# Patient Record
Sex: Female | Born: 2000 | Race: White | Hispanic: No | Marital: Married
Health system: Southern US, Academic
[De-identification: ages and names within clinical notes are randomized; demographics above are authoritative.]

## PROBLEM LIST (undated history)

## (undated) ENCOUNTER — Ambulatory Visit

## (undated) ENCOUNTER — Ambulatory Visit: Attending: Clinical | Primary: Clinical

## (undated) ENCOUNTER — Encounter

## (undated) ENCOUNTER — Ambulatory Visit: Payer: BLUE CROSS/BLUE SHIELD | Attending: "Endocrinology | Primary: "Endocrinology

## (undated) ENCOUNTER — Telehealth

## (undated) ENCOUNTER — Ambulatory Visit: Payer: BLUE CROSS/BLUE SHIELD

## (undated) ENCOUNTER — Encounter: Attending: Physician Assistant | Primary: Physician Assistant

## (undated) ENCOUNTER — Encounter: Attending: Clinical | Primary: Clinical

## (undated) ENCOUNTER — Ambulatory Visit: Attending: Ambulatory Care | Primary: Ambulatory Care

## (undated) ENCOUNTER — Encounter: Attending: Family | Primary: Family

## (undated) ENCOUNTER — Encounter
Attending: Student in an Organized Health Care Education/Training Program | Primary: Student in an Organized Health Care Education/Training Program

## (undated) ENCOUNTER — Encounter: Payer: BLUE CROSS/BLUE SHIELD | Attending: Family | Primary: Family

## (undated) ENCOUNTER — Ambulatory Visit: Payer: PRIVATE HEALTH INSURANCE | Attending: Clinical | Primary: Clinical

## (undated) ENCOUNTER — Encounter: Attending: "Endocrinology | Primary: "Endocrinology

## (undated) ENCOUNTER — Encounter: Attending: Nutritionist | Primary: Nutritionist

## (undated) ENCOUNTER — Telehealth
Attending: Student in an Organized Health Care Education/Training Program | Primary: Student in an Organized Health Care Education/Training Program

## (undated) ENCOUNTER — Ambulatory Visit: Payer: BLUE CROSS/BLUE SHIELD | Attending: Nutritionist | Primary: Nutritionist

## (undated) ENCOUNTER — Other Ambulatory Visit

## (undated) ENCOUNTER — Ambulatory Visit
Payer: BLUE CROSS/BLUE SHIELD | Attending: Student in an Organized Health Care Education/Training Program | Primary: Student in an Organized Health Care Education/Training Program

## (undated) ENCOUNTER — Telehealth: Attending: Family | Primary: Family

## (undated) ENCOUNTER — Encounter: Attending: Internal Medicine | Primary: Internal Medicine

## (undated) ENCOUNTER — Ambulatory Visit: Payer: BLUE CROSS/BLUE SHIELD | Attending: Internal Medicine | Primary: Internal Medicine

## (undated) ENCOUNTER — Ambulatory Visit: Payer: PRIVATE HEALTH INSURANCE

## (undated) ENCOUNTER — Ambulatory Visit: Attending: Pharmacist | Primary: Pharmacist

## (undated) ENCOUNTER — Telehealth: Attending: Nutritionist | Primary: Nutritionist

## (undated) ENCOUNTER — Telehealth: Attending: Ambulatory Care | Primary: Ambulatory Care

## (undated) ENCOUNTER — Encounter: Payer: BLUE CROSS/BLUE SHIELD | Attending: "Endocrinology | Primary: "Endocrinology

## (undated) ENCOUNTER — Non-Acute Institutional Stay: Payer: BLUE CROSS/BLUE SHIELD | Attending: Nutritionist | Primary: Nutritionist

## (undated) ENCOUNTER — Inpatient Hospital Stay

## (undated) ENCOUNTER — Encounter: Payer: BLUE CROSS/BLUE SHIELD | Attending: Internal Medicine | Primary: Internal Medicine

## (undated) ENCOUNTER — Ambulatory Visit: Payer: Medicaid (Managed Care)

## (undated) ENCOUNTER — Telehealth: Attending: "Endocrinology | Primary: "Endocrinology

## (undated) DIAGNOSIS — M1990 Unspecified osteoarthritis, unspecified site: Secondary | ICD-10-CM

## (undated) DIAGNOSIS — E119 Type 2 diabetes mellitus without complications: Secondary | ICD-10-CM

## (undated) HISTORY — PX: TONSILLECTOMY: SUR1361

## (undated) MED ORDER — FAMOTIDINE 10 MG TABLET: Freq: Two times a day (BID) | ORAL | 0 days

## (undated) MED ORDER — CETIRIZINE 10 MG TABLET: Freq: Two times a day (BID) | ORAL | 0 days | PRN

## (undated) MED ORDER — IBUPROFEN 200 MG TABLET: Freq: Four times a day (QID) | ORAL | 0.00 days | PRN

## (undated) MED ORDER — INSULIN ASPART (U-100) 100 UNIT/ML (3 ML) SUBCUTANEOUS PEN: Freq: Three times a day (TID) | SUBCUTANEOUS | 0 days | Status: SS

## (undated) MED ORDER — MULTIVITAMIN CAPSULE: Freq: Every day | ORAL | 0 days

## (undated) MED ORDER — DIPHENHYDRAMINE 25 MG TABLET: Freq: Every evening | ORAL | 0.00 days | PRN

## (undated) MED ORDER — MAGNESIUM 30 MG TABLET: Freq: Two times a day (BID) | ORAL | 0 days

---
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
## 2019-02-18 ENCOUNTER — Encounter
Admit: 2019-02-18 | Discharge: 2019-02-19 | Payer: BLUE CROSS/BLUE SHIELD | Attending: "Endocrinology | Primary: "Endocrinology

## 2019-02-18 DIAGNOSIS — E1065 Type 1 diabetes mellitus with hyperglycemia: Principal | ICD-10-CM

## 2019-02-18 MED ORDER — PEN NEEDLE, DIABETIC 32 GAUGE X 5/32" (4 MM)
12 refills | 0 days | Status: CP
Start: 2019-02-18 — End: ?

## 2019-02-18 MED ORDER — INSULIN ASPART (U-100) 100 UNIT/ML (3 ML) SUBCUTANEOUS PEN: mL | 12 refills | 0 days | Status: AC

## 2019-02-18 MED ORDER — INSULIN ASPART (U-100) 100 UNIT/ML (3 ML) SUBCUTANEOUS PEN
INJECTION | 12 refills | 0.00000 days | Status: CP
Start: 2019-02-18 — End: 2019-02-18

## 2019-02-18 MED ORDER — LANTUS SOLOSTAR U-100 INSULIN 100 UNIT/ML (3 ML) SUBCUTANEOUS PEN
12 refills | 0 days | Status: CP
Start: 2019-02-18 — End: ?

## 2019-08-22 ENCOUNTER — Ambulatory Visit
Admit: 2019-08-22 | Discharge: 2019-08-24 | Disposition: A | Discharge status: Home / Self Care | Payer: BLUE CROSS/BLUE SHIELD | Admitting: Pulmonary Disease

## 2019-10-12 ENCOUNTER — Ambulatory Visit: Admit: 2019-10-12 | Discharge: 2019-10-14 | Payer: BLUE CROSS/BLUE SHIELD

## 2019-10-12 ENCOUNTER — Encounter: Admit: 2019-10-12 | Discharge: 2019-10-14 | Payer: BLUE CROSS/BLUE SHIELD

## 2019-10-14 DIAGNOSIS — A749 Chlamydial infection, unspecified: Principal | ICD-10-CM

## 2019-10-14 MED ORDER — DOXYCYCLINE HYCLATE 100 MG CAPSULE
ORAL_CAPSULE | Freq: Two times a day (BID) | ORAL | 0 refills | 7.00000 days | Status: CP
Start: 2019-10-14 — End: 2019-10-21

## 2019-10-25 ENCOUNTER — Encounter
Admit: 2019-10-25 | Discharge: 2019-10-26 | Payer: BLUE CROSS/BLUE SHIELD | Attending: Internal Medicine | Primary: Internal Medicine

## 2019-10-25 DIAGNOSIS — E1065 Type 1 diabetes mellitus with hyperglycemia: Principal | ICD-10-CM

## 2019-10-25 MED ORDER — DEXCOM G6 SENSOR DEVICE
2 refills | 0 days | Status: CP
Start: 2019-10-25 — End: ?

## 2019-10-25 MED ORDER — DEXCOM G6 TRANSMITTER DEVICE
3 refills | 0.00000 days | Status: CP
Start: 2019-10-25 — End: ?

## 2019-11-08 ENCOUNTER — Encounter
Admit: 2019-11-08 | Discharge: 2019-11-09 | Payer: BLUE CROSS/BLUE SHIELD | Attending: Nutritionist | Primary: Nutritionist

## 2019-11-28 ENCOUNTER — Ambulatory Visit: Admit: 2019-11-28 | Discharge: 2019-11-29 | Payer: BLUE CROSS/BLUE SHIELD

## 2019-11-28 DIAGNOSIS — H5213 Myopia, bilateral: Principal | ICD-10-CM

## 2019-11-28 DIAGNOSIS — E1065 Type 1 diabetes mellitus with hyperglycemia: Principal | ICD-10-CM

## 2019-11-28 DIAGNOSIS — H50812 Duane's syndrome, left eye: Principal | ICD-10-CM

## 2019-12-06 ENCOUNTER — Encounter
Admit: 2019-12-06 | Discharge: 2019-12-07 | Payer: BLUE CROSS/BLUE SHIELD | Attending: Internal Medicine | Primary: Internal Medicine

## 2019-12-06 DIAGNOSIS — E1065 Type 1 diabetes mellitus with hyperglycemia: Principal | ICD-10-CM

## 2019-12-14 DIAGNOSIS — E1069 Type 1 diabetes mellitus with other specified complication: Principal | ICD-10-CM

## 2019-12-15 MED ORDER — INSULIN ASPART U-100  100 UNIT/ML SUBCUTANEOUS SOLUTION
4 refills | 0 days | Status: CP
Start: 2019-12-15 — End: 2020-12-14

## 2019-12-19 ENCOUNTER — Encounter
Admit: 2019-12-19 | Discharge: 2019-12-20 | Payer: BLUE CROSS/BLUE SHIELD | Attending: Nutritionist | Primary: Nutritionist

## 2019-12-19 DIAGNOSIS — E1065 Type 1 diabetes mellitus with hyperglycemia: Principal | ICD-10-CM

## 2020-01-17 ENCOUNTER — Institutional Professional Consult (permissible substitution)
Admit: 2020-01-17 | Discharge: 2020-01-18 | Payer: BLUE CROSS/BLUE SHIELD | Attending: Nutritionist | Primary: Nutritionist

## 2020-01-17 DIAGNOSIS — E1065 Type 1 diabetes mellitus with hyperglycemia: Principal | ICD-10-CM

## 2020-03-09 ENCOUNTER — Encounter
Admit: 2020-03-09 | Discharge: 2020-03-10 | Payer: BLUE CROSS/BLUE SHIELD | Attending: Internal Medicine | Primary: Internal Medicine

## 2020-03-09 DIAGNOSIS — E1069 Type 1 diabetes mellitus with other specified complication: Principal | ICD-10-CM

## 2020-03-09 MED ORDER — GLUCAGON 3 MG/ACTUATION NASAL SPRAY
Freq: Once | NASAL | 3 refills | 0 days | Status: CP | PRN
Start: 2020-03-09 — End: ?

## 2020-03-09 MED ORDER — INSULIN ASPART U-100  100 UNIT/ML SUBCUTANEOUS SOLUTION
4 refills | 0.00000 days | Status: CP
Start: 2020-03-09 — End: 2021-03-09

## 2020-03-30 DIAGNOSIS — E1065 Type 1 diabetes mellitus with hyperglycemia: Principal | ICD-10-CM

## 2020-03-30 MED ORDER — PEN NEEDLE, DIABETIC 32 GAUGE X 5/32" (4 MM)
12 refills | 0 days | Status: CP
Start: 2020-03-30 — End: ?

## 2020-04-12 DIAGNOSIS — M0800 Unspecified juvenile rheumatoid arthritis of unspecified site: Principal | ICD-10-CM

## 2020-04-23 ENCOUNTER — Encounter: Admit: 2020-04-23 | Discharge: 2020-04-24 | Payer: BLUE CROSS/BLUE SHIELD

## 2020-04-23 ENCOUNTER — Ambulatory Visit: Admit: 2020-04-23 | Discharge: 2020-04-24 | Payer: BLUE CROSS/BLUE SHIELD

## 2020-04-23 DIAGNOSIS — M2550 Pain in unspecified joint: Principal | ICD-10-CM

## 2020-04-23 DIAGNOSIS — M47819 Spondylosis without myelopathy or radiculopathy, site unspecified: Principal | ICD-10-CM

## 2020-04-23 MED ORDER — PANTOPRAZOLE 20 MG TABLET,DELAYED RELEASE
ORAL_TABLET | Freq: Every day | ORAL | 3 refills | 90.00000 days | Status: CP
Start: 2020-04-23 — End: 2021-04-23

## 2020-04-24 ENCOUNTER — Institutional Professional Consult (permissible substitution): Admit: 2020-04-24 | Discharge: 2020-04-25

## 2020-04-24 DIAGNOSIS — Z006 Encounter for examination for normal comparison and control in clinical research program: Principal | ICD-10-CM

## 2020-04-25 ENCOUNTER — Encounter
Admit: 2020-04-25 | Discharge: 2020-04-26 | Payer: BLUE CROSS/BLUE SHIELD | Attending: Nutritionist | Primary: Nutritionist

## 2020-04-25 DIAGNOSIS — E1069 Type 1 diabetes mellitus with other specified complication: Principal | ICD-10-CM

## 2020-04-25 DIAGNOSIS — E1065 Type 1 diabetes mellitus with hyperglycemia: Principal | ICD-10-CM

## 2020-04-25 MED ORDER — INSULIN ASPART U-100  100 UNIT/ML SUBCUTANEOUS SOLUTION
4 refills | 0.00000 days | Status: CP
Start: 2020-04-25 — End: 2021-04-25

## 2020-04-25 MED ORDER — DEXCOM G6 SENSOR DEVICE
2 refills | 0 days | Status: CP
Start: 2020-04-25 — End: ?

## 2020-04-25 MED ORDER — INPEN (FOR NOVOLOG OR FIASP) SUBCUTANEOUS
SUBCUTANEOUS | 0 refills | 0.00000 days | Status: CP
Start: 2020-04-25 — End: ?

## 2020-04-25 MED ORDER — NORETHINDRONE ACETATE 1 MG-ETHINYL ESTRADIOL 20 MCG TABLET
ORAL_TABLET | Freq: Every day | ORAL | 0 refills | 84.00000 days | Status: CP
Start: 2020-04-25 — End: ?

## 2020-04-25 MED ORDER — DEXCOM G6 TRANSMITTER DEVICE
3 refills | 0.00000 days | Status: CP
Start: 2020-04-25 — End: ?

## 2020-05-01 ENCOUNTER — Institutional Professional Consult (permissible substitution): Admit: 2020-05-01 | Discharge: 2020-05-02

## 2020-05-01 DIAGNOSIS — Z006 Encounter for examination for normal comparison and control in clinical research program: Principal | ICD-10-CM

## 2020-05-01 MED ORDER — STUDY TTP399-1189 TTP399/PLACEBO 400 MG TABLET
PACK | Freq: Every morning | ORAL | 0 refills | 20.00000 days | Status: CP
Start: 2020-05-01 — End: 2020-05-08

## 2020-05-01 MED ORDER — NAPROXEN 500 MG TABLET
ORAL_TABLET | Freq: Two times a day (BID) | ORAL | 1 refills | 30.00 days | Status: CP
Start: 2020-05-01 — End: 2020-06-30

## 2020-05-08 ENCOUNTER — Ambulatory Visit: Admit: 2020-05-08 | Discharge: 2020-05-09

## 2020-05-14 ENCOUNTER — Institutional Professional Consult (permissible substitution): Admit: 2020-05-14 | Discharge: 2020-05-15

## 2020-05-14 DIAGNOSIS — Z006 Encounter for examination for normal comparison and control in clinical research program: Principal | ICD-10-CM

## 2020-05-16 ENCOUNTER — Ambulatory Visit: Admit: 2020-05-16 | Discharge: 2020-05-17 | Payer: BLUE CROSS/BLUE SHIELD

## 2020-05-16 ENCOUNTER — Ambulatory Visit
Admit: 2020-05-16 | Discharge: 2020-05-20 | Disposition: A | Discharge status: Home / Self Care | Payer: BLUE CROSS/BLUE SHIELD

## 2020-05-16 DIAGNOSIS — M549 Dorsalgia, unspecified: Principal | ICD-10-CM

## 2020-05-20 MED ORDER — ACETAMINOPHEN 500 MG TABLET
ORAL_TABLET | Freq: Three times a day (TID) | ORAL | 0 refills | 5.00000 days | PRN
Start: 2020-05-20 — End: ?

## 2020-05-20 MED ORDER — ONDANSETRON 4 MG DISINTEGRATING TABLET
ORAL_TABLET | Freq: Three times a day (TID) | ORAL | 0 refills | 4 days | Status: CP | PRN
Start: 2020-05-20 — End: ?

## 2020-05-20 MED ORDER — IBUPROFEN 600 MG TABLET
ORAL_TABLET | Freq: Four times a day (QID) | ORAL | 2 refills | 15 days | PRN
Start: 2020-05-20 — End: 2021-05-20

## 2020-05-21 MED ORDER — CIPROFLOXACIN 500 MG TABLET
ORAL_TABLET | Freq: Two times a day (BID) | ORAL | 0 refills | 6 days | Status: CP
Start: 2020-05-21 — End: 2020-05-27

## 2020-05-31 MED ORDER — NAPROXEN 500 MG TABLET
0.00000 days
Start: 2020-05-31 — End: 2020-06-19

## 2020-06-19 ENCOUNTER — Ambulatory Visit: Admit: 2020-06-19 | Discharge: 2020-06-20 | Payer: BLUE CROSS/BLUE SHIELD

## 2020-06-19 DIAGNOSIS — E1069 Type 1 diabetes mellitus with other specified complication: Principal | ICD-10-CM

## 2020-06-19 MED ORDER — NORETHINDRONE ACETATE 1 MG-ETHINYL ESTRADIOL 20 MCG TABLET
ORAL_TABLET | Freq: Every day | ORAL | 2 refills | 84 days | Status: CP
Start: 2020-06-19 — End: ?

## 2020-07-17 ENCOUNTER — Institutional Professional Consult (permissible substitution)
Admit: 2020-07-17 | Discharge: 2020-07-18 | Payer: BLUE CROSS/BLUE SHIELD | Attending: Nutritionist | Primary: Nutritionist

## 2020-09-11 DIAGNOSIS — L509 Urticaria, unspecified: Principal | ICD-10-CM

## 2020-09-11 DIAGNOSIS — M19071 Primary osteoarthritis, right ankle and foot: Principal | ICD-10-CM

## 2020-09-11 DIAGNOSIS — M19072 Primary osteoarthritis, left ankle and foot: Principal | ICD-10-CM

## 2020-09-11 DIAGNOSIS — Z79899 Other long term (current) drug therapy: Principal | ICD-10-CM

## 2020-09-11 DIAGNOSIS — Z880 Allergy status to penicillin: Principal | ICD-10-CM

## 2020-09-11 DIAGNOSIS — Z794 Long term (current) use of insulin: Principal | ICD-10-CM

## 2020-09-11 DIAGNOSIS — E109 Type 1 diabetes mellitus without complications: Principal | ICD-10-CM

## 2020-09-11 DIAGNOSIS — Z9641 Presence of insulin pump (external) (internal): Principal | ICD-10-CM

## 2020-09-12 ENCOUNTER — Encounter
Admit: 2020-09-12 | Discharge: 2020-09-12 | Disposition: A | Discharge status: Home / Self Care | Payer: BLUE CROSS/BLUE SHIELD

## 2020-09-12 MED ORDER — TRIAMCINOLONE ACETONIDE 0.1 % TOPICAL CREAM
Freq: Two times a day (BID) | TOPICAL | 0 refills | 0 days | Status: CP
Start: 2020-09-12 — End: 2021-09-12

## 2020-09-12 MED ORDER — MONTELUKAST 10 MG TABLET
ORAL_TABLET | Freq: Every evening | ORAL | 0 refills | 20 days | Status: CP
Start: 2020-09-12 — End: 2020-10-02

## 2020-09-19 ENCOUNTER — Encounter
Admit: 2020-09-19 | Discharge: 2020-09-20 | Payer: BLUE CROSS/BLUE SHIELD | Attending: Internal Medicine | Primary: Internal Medicine

## 2020-09-19 ENCOUNTER — Encounter
Admit: 2020-09-19 | Discharge: 2020-09-20 | Payer: BLUE CROSS/BLUE SHIELD | Attending: Physician Assistant | Primary: Physician Assistant

## 2020-09-19 DIAGNOSIS — E1065 Type 1 diabetes mellitus with hyperglycemia: Principal | ICD-10-CM

## 2020-09-19 DIAGNOSIS — R319 Hematuria, unspecified: Principal | ICD-10-CM

## 2020-09-19 DIAGNOSIS — E063 Autoimmune thyroiditis: Principal | ICD-10-CM

## 2020-09-19 DIAGNOSIS — N898 Other specified noninflammatory disorders of vagina: Principal | ICD-10-CM

## 2020-09-19 MED ORDER — LEVOTHYROXINE 25 MCG TABLET
ORAL_TABLET | Freq: Every day | ORAL | 3 refills | 90 days | Status: CP
Start: 2020-09-19 — End: 2021-09-19

## 2020-09-19 MED ORDER — FLUCONAZOLE 150 MG TABLET
ORAL_TABLET | 0 refills | 0 days | Status: CP
Start: 2020-09-19 — End: ?

## 2020-09-20 MED ORDER — CLINDAMYCIN 2 % VAGINAL CREAM
Freq: Every evening | VAGINAL | 0 refills | 8 days | Status: CP
Start: 2020-09-20 — End: 2020-09-27

## 2020-12-07 MED ORDER — XOLAIR 150 MG/ML SUBCUTANEOUS SYRINGE
0.00000 days
Start: 2020-12-07 — End: ?

## 2020-12-19 MED ORDER — PREDNISONE 20 MG TABLET
0.00000 days
Start: 2020-12-19 — End: ?

## 2020-12-25 ENCOUNTER — Encounter
Admit: 2020-12-25 | Discharge: 2020-12-26 | Payer: BLUE CROSS/BLUE SHIELD | Attending: Internal Medicine | Primary: Internal Medicine

## 2020-12-25 DIAGNOSIS — E1069 Type 1 diabetes mellitus with other specified complication: Principal | ICD-10-CM

## 2020-12-25 MED ORDER — DEXCOM G6 SENSOR DEVICE
2 refills | 0 days | Status: CP
Start: 2020-12-25 — End: ?

## 2020-12-25 MED ORDER — DEXCOM G6 TRANSMITTER DEVICE
3 refills | 0.00000 days | Status: CP
Start: 2020-12-25 — End: ?

## 2021-01-02 ENCOUNTER — Encounter
Admit: 2021-01-02 | Discharge: 2021-01-03 | Payer: BLUE CROSS/BLUE SHIELD | Attending: Internal Medicine | Primary: Internal Medicine

## 2021-02-05 ENCOUNTER — Encounter: Admit: 2021-02-05 | Discharge: 2021-02-06 | Payer: BLUE CROSS/BLUE SHIELD

## 2021-02-05 DIAGNOSIS — E039 Hypothyroidism, unspecified: Principal | ICD-10-CM

## 2021-02-05 DIAGNOSIS — Z113 Encounter for screening for infections with a predominantly sexual mode of transmission: Principal | ICD-10-CM

## 2021-02-05 DIAGNOSIS — Z13220 Encounter for screening for lipoid disorders: Principal | ICD-10-CM

## 2021-02-05 DIAGNOSIS — Z1159 Encounter for screening for other viral diseases: Principal | ICD-10-CM

## 2021-02-05 DIAGNOSIS — E1065 Type 1 diabetes mellitus with hyperglycemia: Principal | ICD-10-CM

## 2021-02-05 DIAGNOSIS — T7840XS Allergy, unspecified, sequela: Principal | ICD-10-CM

## 2021-02-05 DIAGNOSIS — Z0000 Encounter for general adult medical examination without abnormal findings: Principal | ICD-10-CM

## 2021-02-08 DIAGNOSIS — E039 Hypothyroidism, unspecified: Principal | ICD-10-CM

## 2021-02-21 ENCOUNTER — Ambulatory Visit: Admit: 2021-02-21 | Payer: BLUE CROSS/BLUE SHIELD

## 2021-03-17 MED ORDER — PREDNISONE 50 MG TABLET
Freq: Every day | ORAL | 0 days
Start: 2021-03-17 — End: ?

## 2021-03-20 ENCOUNTER — Ambulatory Visit: Admit: 2021-03-20 | Discharge: 2021-03-21 | Attending: Ambulatory Care | Primary: Ambulatory Care

## 2021-04-03 ENCOUNTER — Ambulatory Visit: Admit: 2021-04-03 | Discharge: 2021-04-03 | Payer: PRIVATE HEALTH INSURANCE

## 2021-04-03 ENCOUNTER — Encounter
Admit: 2021-04-03 | Discharge: 2021-04-03 | Payer: PRIVATE HEALTH INSURANCE | Attending: Internal Medicine | Primary: Internal Medicine

## 2021-04-03 DIAGNOSIS — R635 Abnormal weight gain: Principal | ICD-10-CM

## 2021-04-03 DIAGNOSIS — Z124 Encounter for screening for malignant neoplasm of cervix: Principal | ICD-10-CM

## 2021-04-03 DIAGNOSIS — E063 Autoimmune thyroiditis: Principal | ICD-10-CM

## 2021-04-03 DIAGNOSIS — E1069 Type 1 diabetes mellitus with other specified complication: Principal | ICD-10-CM

## 2021-04-03 MED ORDER — INSULIN LISPRO (U-100) 100 UNIT/ML SUBCUTANEOUS PEN
12 refills | 0.00000 days | Status: CP
Start: 2021-04-03 — End: ?

## 2021-04-03 MED ORDER — BLOOD SUGAR DIAGNOSTIC STRIPS
Freq: Four times a day (QID) | 12 refills | 0 days | Status: CP
Start: 2021-04-03 — End: ?

## 2021-04-03 MED ORDER — LANCETS 33 GAUGE
3 refills | 0 days | Status: CP
Start: 2021-04-03 — End: ?
  Filled 2021-04-15: qty 200, 33d supply, fill #0

## 2021-04-03 MED ORDER — INSULIN LISPRO (U-100) 100 UNIT/ML SUBCUTANEOUS SOLUTION
12 refills | 0.00000 days | Status: CP
Start: 2021-04-03 — End: ?
  Filled 2021-04-15: qty 90, 90d supply, fill #0

## 2021-04-15 MED FILL — CONTOUR NEXT TEST STRIPS: 75 days supply | Qty: 300 | Fill #0

## 2021-04-16 MED ORDER — HYDROXYZINE HCL 25 MG TABLET
ORAL_TABLET | 3 refills | 0 days
Start: 2021-04-16 — End: ?

## 2021-04-16 MED ORDER — LEVOCETIRIZINE 5 MG TABLET
ORAL_TABLET | 5 refills | 0 days
Start: 2021-04-16 — End: ?

## 2021-04-19 ENCOUNTER — Encounter: Admit: 2021-04-19 | Discharge: 2021-04-20 | Payer: BLUE CROSS/BLUE SHIELD

## 2021-04-19 MED FILL — HYDROXYZINE HCL 25 MG TABLET: 20 days supply | Qty: 60 | Fill #0

## 2021-04-30 MED ORDER — TACROLIMUS 1 MG CAPSULE, IMMEDIATE-RELEASE
ORAL_CAPSULE | Freq: Two times a day (BID) | ORAL | 1 refills | 30.00000 days
Start: 2021-04-30 — End: ?

## 2021-05-01 NOTE — Unmapped (Signed)
[name] Specialty Medication Onboarding    Specialty Medication: Tacrolimus 1mg capsule  Prior Authorization: Not Required   Financial Assistance: No - copay  <$25  Final Copay/Day Supply: $0 / 30 days    Insurance Restrictions: None     Notes to Pharmacist: N/A    The triage team has completed the benefits investigation and has determined that the patient is able to fill this medication at [facility]. Please contact the patient to complete the onboarding or follow up with the prescribing physician as needed.

## 2021-05-02 MED FILL — TACROLIMUS 1 MG CAPSULE, IMMEDIATE-RELEASE: ORAL | 30 days supply | Qty: 60 | Fill #0

## 2021-05-02 NOTE — Unmapped (Signed)
[name]   Patient Onboarding/Medication Counseling    Sandra Underwood is a 20 y.o. female with urticaria who I am counseling today on initiation of therapy.  I am speaking to the patient.    Was a Nurse, learning disability used for this call? No    Verified patient's date of birth / HIPAA.    Specialty medication(s) to be sent: Inflammatory Disorders: tacrolimus      Non-specialty medications/supplies to be sent: na      Medications not needed at this time: na         Prograf (tacrolimus)    Medication & Administration     Dosage: Take 1 capsule (1 mg) by mouth twice daily     Administration:   ??? May take with or without food  ??? Take 12 hours apart    Adherence/Missed dose instructions:  ??? Take a missed dose as soon as you think about it.  ??? If it is close to the time for your next dose, skip the missed dose and go back to your normal time.  ??? Do not take 2 doses at the same time or extra doses.    Goals of Therapy     ??? To reduce itching, and reduce need for corticosteroids    Side Effects & Monitoring Parameters     ??? Common side effects  ??? Dizziness  ??? Fatigue  ??? Headache  ??? Stuffy nose or sore throat  ??? Nausea, vomiting, stomach pain, diarrhea, constipation  ??? Heartburn  ??? Back or joint pain  ??? Increased risk of infection    ??? The following side effects should be reported to the provider:  ??? Allergic reaction  ??? Kidney issues (change in quantity or urine passed, blood in urine, or weight gain)  ??? High blood pressure (dizziness, change in eyesight, headache)  ??? Electrolyte issues (change in mood, confusion, muscle pain, or weakness)  ??? Abnormal breathing  ??? Shakiness  ??? Unexplained bleeding or bruising (gums bleeding, blood in urine, nosebleeds, any abnormal bleeding)  ??? Signs of infection (fever, cough, wounds that will not heal)  ??? Skin changes (sores, paleness, new or changed bumps or moles)    ??? Monitoring Parameters  ??? Renal function  ??? Liver function  ??? Glucose levels  ??? Blood pressure  ??? Tacrolimus trough levels  ??? Cardiac monitoring (for QT prolongation)       Contraindications, Warnings, & Precautions     ??? Black Box Warning: Infections - immunosuppressant agents increase the risk of infection that may lead to hospitalization or death  ??? Black Box Warning: Malignancy - immunosuppressant agents may be associated with the development of malignancies that may lead to hospitalization or death  ??? Limit or avoid sun and ultraviolet light exposure, use appropriate sun protection  ??? Myocardial hypertrophy -avoid use in patients with congenital long QT syndrome  ??? Diabetes mellitus - the risk for new-onset diabetes and insulin-dependent post-transplant diabetes mellitus is increased with tacrolimus use after transplantation  ??? GI perforation  ??? Hyperkalemia  ??? Hypertension  ??? Nephrotoxicity  ??? Neurotoxicity  ??? This is a narrow therapeutic index drug. Do not switch manufacturers without first talking to the provider.    Drug/Food Interactions     ??? Medication list reviewed in Epic. The patient was instructed to inform the care team before taking any new medications or supplements. reviewed risk of QT prolongation, hydroxyzine and tacrolimus are both QT prolongers. Reviewed not to add new medicines (  zofran, etc) without reviewing med list. Her hope is to be able to reduce need for hydrox.Marland Kitchen   ??? Avoid alcohol  ??? Avoid grapefruit or grapefruit juice  ??? Avoid live vaccines    Storage, Handling Precautions, & Disposal     ??? Store at room temperature  ??? Keep away from children and pets      Current Medications (including OTC/herbals), Comorbidities and Allergies     Current Outpatient Medications   Medication Sig Dispense Refill   ??? acetaminophen (TYLENOL) 500 MG tablet Take 2 tablets (1,000 mg total) by mouth every eight (8) hours as needed for pain. 30 tablet 0   ??? blood sugar diagnostic Strp Use to check blood sugar four (4) times a day (before meals and nightly). 300 each 12   ??? blood-glucose sensor (DEXCOM G6 SENSOR) Devi 1 each by Miscellaneous route every ten (10) days. 9 each 2   ??? blood-glucose transmitter (DEXCOM G6 TRANSMITTER) Devi 1 each by Miscellaneous route Every three (3) months. 1 each 3   ??? cetirizine (ZYRTEC) 10 MG tablet Take 20 mg by mouth two (2) times a day as needed for allergies.     ??? famotidine (PEPCID) 10 MG tablet Take 10 mg by mouth Two (2) times a day.     ??? hydrOXYzine (ATARAX) 25 MG tablet Take 1 tablet by mouth every eight hours as needed for itching 60 tablet 3   ??? insulin admin supplies (INPEN, FOR NOVOLOG OR FIASP,) InPn injection pen Dispense 1 InPen 1 each 0   ??? insulin lispro (HUMALOG KWIKPEN INSULIN) 100 unit/mL injection pen Use up to 30 units per day, as per MD instructions 15 mL 12   ??? insulin lispro (HUMALOG U-100 INSULIN) 100 unit/mL injection Use up to 100 units/day, divided into 3 times daily before meals as per MD instructions 90 mL 12   ??? lancets (ONETOUCH DELICA LANCETS) 33 gauge Misc Use to check bloof sugar 6 times daily 200 each 3   ??? levocetirizine (XYZAL) 5 MG tablet Take 2 tablets by mouth twice a day for urticaria 120 tablet 5   ??? levothyroxine (SYNTHROID) 25 MCG tablet Take 1 tablet (25 mcg total) by mouth daily. 90 tablet 3   ??? magnesium 30 mg tablet Take 30 mg by mouth Two (2) times a day.     ??? montelukast (SINGULAIR) 10 mg tablet Take 1 tablet (10 mg total) by mouth nightly for 20 days. 20 tablet 0   ??? norethindrone-ethinyl estradiol (JUNEL 1/20, 21,) 1-20 mg-mcg per tablet Take 1 tablet by mouth daily. 84 tablet 2   ??? pen needle, diabetic (BD ULTRA-FINE NANO PEN NEEDLE) 32 gauge x 5/32 (4 mm) Ndle ok to sub any brand or size needle preferred by insurance/patient, use 4x/day, dx E10.65 100 each 12   ??? pen needle, diabetic (BD ULTRA-FINE NANO PEN NEEDLE) 32 gauge x 5/32 Ndle To administer insulin 6-8x/day 200 each 3   ??? predniSONE (DELTASONE) 50 MG tablet Take 50 mg by mouth in the morning.     ??? syringe with needle 1 mL 28 gauge x 1/2 Syrg Use every 7 (seven) days. ??? tacrolimus (PROGRAF) 1 MG capsule Take 1 capsule (1 mg total) by mouth two (2) times a day. 60 capsule 1   ??? XOLAIR 150 mg/mL syringe        No current facility-administered medications for this visit.       Allergies   Allergen Reactions   ??? Amoxicillin-Pot Clavulanate Rash   ???  Grass Pollen Hives   ??? Lemon Hives   ??? Malt Extract Hives   ??? Metronidazole Hives   ??? Peanut Hives   ??? Tree And Shrub Pollen Hives   ??? Amoxicillin Rash   ??? Penicillins Rash       Patient Active Problem List   Diagnosis   ??? Enthesitis related arthritis (CMS-HCC)   ??? Type 1 diabetes mellitus with hyperglycemia, with long-term current use of insulin (CMS-HCC)   ??? Spondyloarthritis   ??? Polyarthralgia   ??? Hematuria   ??? Vaginal discharge   ??? Hypothyroidism   ??? Allergies       Reviewed and up to date in Epic.    Appropriateness of Therapy     Acute infections noted within Epic:  No active infections  Patient reported infection: None    Is medication and dose appropriate based on diagnosis and infection status? Yes    Prescription has been clinically reviewed: Yes      Baseline Quality of Life Assessment      How many days over the past month did your urticaria  keep you from your normal activities? For example, brushing your teeth or getting up in the morning. Patient declined to answer    Financial Information     Medication Assistance provided: None Required    Anticipated copay of $0 reviewed with patient. Verified delivery address.    Delivery Information     Scheduled delivery date: Thurs, 6/30    Expected start date: Thurs, 6/30    Medication will be delivered via Same Day Courier to the prescription address in [facility].  This shipment will not require a signature.      Explained the services we provide at [facility] and that each month we would call to set up refills.  Stressed importance of returning phone calls so that we could ensure they receive their medications in time each month.  Informed patient that we should be setting up refills 7-10 days prior to when they will run out of medication.  A pharmacist will reach out to perform a clinical assessment periodically.  Informed patient that a welcome packet, containing information about our pharmacy and other support services, a Notice of Privacy Practices, and a drug information handout will be sent.      The patient or caregiver noted above participated in the development of this care plan and knows that they can request review of or adjustments to the care plan at any time.      Patient or caregiver verbalized understanding of the above information as well as how to contact the pharmacy at [phone] option 4 with any questions/concerns.  The pharmacy is open Monday through Friday 8:30am-4:30pm.  A pharmacist is available 24/7 via pager to answer any clinical questions they may have.    Patient Specific Needs     - Does the patient have any physical, cognitive, or cultural barriers? No    - Does the patient have adequate living arrangements? (i.e. the ability to store and take their medication appropriately) Yes    - Did you identify any home environmental safety or security hazards? No    - Patient prefers to have medications discussed with  Patient     - Is the patient or caregiver able to read and understand education materials at a high school level or above? Yes    - Patient's primary language is  English     - Is the patient  high risk? No    - Does the patient require physician intervention or other additional services (i.e. dietary/nutrition, smoking cessation, social work)? No      Sandra Underwood A Desiree Lucy [facility] Specialty Pharmacist

## 2021-05-23 NOTE — Unmapped (Signed)
[facility] Clinical Assessment & Refill Coordination Note    Sandra Underwood, [place]: 2001/07/24  Phone: [phone] (home)     All above HIPAA information was verified with patient.     Was a Nurse, learning disability used for this call? No    Specialty Medication(s):   Inflammatory Disorders: tacrolimus     Current Outpatient Medications   Medication Sig Dispense Refill   ??? acetaminophen (TYLENOL) 500 MG tablet Take 2 tablets (1,000 mg total) by mouth every eight (8) hours as needed for pain. 30 tablet 0   ??? blood sugar diagnostic Strp Use to check blood sugar four (4) times a day (before meals and nightly). 300 each 12   ??? blood-glucose sensor (DEXCOM G6 SENSOR) Devi 1 each by Miscellaneous route every ten (10) days. 9 each 2   ??? blood-glucose transmitter (DEXCOM G6 TRANSMITTER) Devi 1 each by Miscellaneous route Every three (3) months. 1 each 3   ??? cetirizine (ZYRTEC) 10 MG tablet Take 20 mg by mouth two (2) times a day as needed for allergies.     ??? famotidine (PEPCID) 10 MG tablet Take 10 mg by mouth Two (2) times a day.     ??? hydrOXYzine (ATARAX) 25 MG tablet Take 1 tablet by mouth every eight hours as needed for itching 60 tablet 3   ??? insulin admin supplies (INPEN, FOR NOVOLOG OR FIASP,) InPn injection pen Dispense 1 InPen 1 each 0   ??? insulin lispro (HUMALOG KWIKPEN INSULIN) 100 unit/mL injection pen Use up to 30 units per day, as per MD instructions 15 mL 12   ??? insulin lispro (HUMALOG U-100 INSULIN) 100 unit/mL injection Use up to 100 units/day, divided into 3 times daily before meals as per MD instructions 90 mL 12   ??? lancets (ONETOUCH DELICA LANCETS) 33 gauge Misc Use to check bloof sugar 6 times daily 200 each 3   ??? levocetirizine (XYZAL) 5 MG tablet Take 2 tablets by mouth twice a day for urticaria 120 tablet 5   ??? levothyroxine (SYNTHROID) 25 MCG tablet Take 1 tablet (25 mcg total) by mouth daily. 90 tablet 3   ??? magnesium 30 mg tablet Take 30 mg by mouth Two (2) times a day. ??? montelukast (SINGULAIR) 10 mg tablet Take 1 tablet (10 mg total) by mouth nightly for 20 days. 20 tablet 0   ??? norethindrone-ethinyl estradiol (JUNEL 1/20, 21,) 1-20 mg-mcg per tablet Take 1 tablet by mouth daily. 84 tablet 2   ??? pen needle, diabetic (BD ULTRA-FINE NANO PEN NEEDLE) 32 gauge x 5/32 (4 mm) Ndle ok to sub any brand or size needle preferred by insurance/patient, use 4x/day, dx E10.65 100 each 12   ??? pen needle, diabetic (BD ULTRA-FINE NANO PEN NEEDLE) 32 gauge x 5/32 Ndle To administer insulin 6-8x/day 200 each 3   ??? predniSONE (DELTASONE) 50 MG tablet Take 50 mg by mouth in the morning.     ??? syringe with needle 1 mL 28 gauge x 1/2 Syrg Use every 7 (seven) days.     ??? tacrolimus (PROGRAF) 1 MG capsule Take 1 capsule (1 mg total) by mouth two (2) times a day. 60 capsule 1   ??? XOLAIR 150 mg/mL syringe  (Patient not taking: Reported on 05/23/2021)       No current facility-administered medications for this visit.        Changes to medications: [name] reports no changes at this time.    Allergies   Allergen  Reactions   ??? Amoxicillin-Pot Clavulanate Rash   ??? Grass Pollen Hives   ??? Lemon Hives   ??? Malt Extract Hives   ??? Metronidazole Hives   ??? Peanut Hives   ??? Tree And Shrub Pollen Hives   ??? Amoxicillin Rash   ??? Penicillins Rash       Changes to allergies: No    SPECIALTY MEDICATION ADHERENCE     tacrolimus 1 mg: ~7 days of medicine on hand       Medication Adherence    Patient reported X missed doses in the last month: 0  Specialty Medication: tacrolimus  Patient is on additional specialty medications: No  Informant: patient          Specialty medication(s) dose(s) confirmed: Regimen is correct and unchanged.     Are there any concerns with adherence? No    Adherence counseling provided? Not needed    CLINICAL MANAGEMENT AND INTERVENTION      Clinical Benefit Assessment:    Do you feel the medicine is effective or helping your condition? Yes    Clinical Benefit counseling provided? Not needed    Adverse Effects Assessment:    Are you experiencing any side effects? No    Are you experiencing difficulty administering your medicine? No    Quality of Life Assessment:    Quality of Life    Rheumatology  On a scale of 1 - 10 with 1 representing not at all and 10 representing completely - how has your rheumatologic condition affected your:  Oncology  Dermatology  1.On a scale of 1-10, how itchy, sore, painful or stinging has your skin condition been within the last week?: 7  2.On a scale of 1-10, how embarrassed or self-conscious have you felt because of your skin within the last week?: 3  3.On a scale of 1-10, how much has your skin affected your social or leisure activities within the last week?: 1           How many days over the past month did your urticaria  keep you from your normal activities? For example, brushing your teeth or getting up in the morning. 0    Have you discussed this with your provider? Not needed    Acute Infection Status:    Acute infections noted within Epic:  No active infections  Patient reported infection: None    Therapy Appropriateness:    Is therapy appropriate? Yes, therapy is appropriate and should be continued    DISEASE/MEDICATION-SPECIFIC INFORMATION      N/A    PATIENT SPECIFIC NEEDS     - Does the patient have any physical, cognitive, or cultural barriers? No    - Is the patient high risk? No    - Does the patient require a Care Management Plan? No     - Does the patient require physician intervention or other additional services (i.e. nutrition, smoking cessation, social work)? No      SHIPPING     Specialty Medication(s) to be Shipped:   Inflammatory Disorders: tacrolimus    Other medication(s) to be shipped: No additional medications requested for fill at this time     Changes to insurance: No    Delivery Scheduled: Yes, Expected medication delivery date: 05/28/21.     Medication will be delivered via Next Day Courier to the confirmed prescription address in [facility].    The patient will receive a drug information handout for each medication shipped and additional FDA Medication Guides  as required.  Verified that patient has previously received a Conservation officer, historic buildings and a Surveyor, mining.    The patient or caregiver noted above participated in the development of this care plan and knows that they can request review of or adjustments to the care plan at any time.      All of the patient's questions and concerns have been addressed.    Arnold Long   [facility] Specialty Pharmacist

## 2021-05-27 MED FILL — TACROLIMUS 1 MG CAPSULE, IMMEDIATE-RELEASE: ORAL | 30 days supply | Qty: 60 | Fill #1

## 2021-06-04 ENCOUNTER — Encounter: Admit: 2021-06-04 | Discharge: 2021-06-05 | Payer: BLUE CROSS/BLUE SHIELD

## 2021-06-04 NOTE — Unmapped (Signed)
Diabetes Education Note    Referring Provider:  Rosiland Oz. 07/05/2021    Time In / Out:   9am-10am;    Assessment:   Pt presents for education support. Previously worked with Jae Dire and enjoyed that relationship. Appears depression and anxiety can complicate her self care. Presents without any clear expectations but relationship building. Educational intervention and any recommendations can be seen below in the PLAN section.         Plan:      Education Intervention:  -- t-mobile connect bolus option will be available when they send you an email link  -- control IQ technology reviewed  Weight updated  TDD unpdated  Sleep Schedule updated  -- how to change profiles and access features on pump reviewed  -- pt needs to bolus prior to meals. Bolus for food % is pretty low. Take 50% of what is expected  -- contol IQ bolus is kicking in a lot. This was discussed. Pt needs to bolus prior  -- calorie king and my fitness pal discussed as apps to get  -- basal was reduced for 12am by 0.075 > 1.575  -- new time segment created for 3am. 1.355  -- adjusted 7am to start at 6am. Maintained 1.5 for this time  -- pt in sleep schedule at this time but potential for lows are between 12-9am.    Educator Recommendations / Plan for Supervising Physician:  -- see education provided above  --     Subjective:        Desired Leaning Objective of Today  -- no expectations      Diabetes History  -- How long have you had diabetes?  Since 2014    -- Do you know your diabetes type and what it means?  Mis-diagnosed initially. Wrongly diagnosed with type 2 dm    Psychosocial History:  -- Depressed and in therapy     Diabetes Meds:     Tandem Control IQ and Dexcom    Not using extended bolus    At times can get skin flares that require steroids    Monitoring  -- Are you currently monitoring your sugar?    Dexcom      Wt Status / History  -- Are you happy with your weight?  no    Nutrition:    -- Have you ever been taught how to carb count?  B  Doesn't take the time to look it up    -- 24hr recall  How Many Meals per day?     Wake 7:45am  No breakfast; might be a snack  12pm; might forget for the small amount of carbs    Bed 10pm        Objective:             Past Medical History:   Diagnosis Date   ??? Allergic conjunctivitis    ??? Allergic rhinitis    ??? Arthritis     ankles and elbows   ??? DKA (diabetic ketoacidoses)    ??? DM (diabetes mellitus), type 1 (CMS-HCC)    ??? Food allergy     lemon   ??? Type 1 Duane's syndrome        Relevant Labs  HGB A1C, POC   Date/Time Value Ref Range Status   04/03/2021 09:21 AM 7.2 (H) <7.0 % Final     Comment:     A1c Glycemic Goal: <7.0%     **Goals should be individualized; more or less  stringent A1c glycemic goals may be appropriate for individual patients.      (Adopted from: 2020 ADA Standards of Medical Care In Diabetes)  Point of Care A1c testing is not FDA-approved for the diagnosis of Diabetes.   12/25/2020 01:23 PM 8.1 (H) <7.0 % Final     Comment:     A1c Glycemic Goal: <7.0%     **Goals should be individualized; more or less stringent A1c glycemic goals may be appropriate for individual patients.      (Adopted from: 2020 ADA Standards of Medical Care In Diabetes)  Point of Care A1c testing is not FDA-approved for the diagnosis of Diabetes.         Glucose Data    Current Pump Settings      Cathrine Muster. RD. CDCES

## 2021-06-17 MED ORDER — TACROLIMUS 1 MG CAPSULE, IMMEDIATE-RELEASE
ORAL_CAPSULE | Freq: Two times a day (BID) | ORAL | 1 refills | 30 days
Start: 2021-06-17 — End: ?

## 2021-06-17 NOTE — Unmapped (Signed)
[facility] Refill Coordination Note    Specialty Medication(s) to be Shipped:   Inflammatory Disorders: tacrolimus    Other medication(s) to be shipped: No additional medications requested for fill at this time     Sandra Underwood, DOB: 10/23/2001  Phone: [phone] (home)       All above HIPAA information was verified with patient.     Was a Nurse, learning disability used for this call? No    Completed refill call assessment today to schedule patient's medication shipment from the [facility] [phone]).  All relevant notes have been reviewed.     Specialty medication(s) and dose(s) confirmed: Regimen is correct and unchanged.   Changes to medications: Sandra Underwood reports no changes at this time.  Changes to insurance: No  New side effects reported not previously addressed with a pharmacist or physician: None reported  Questions for the pharmacist: No    Confirmed patient received a Conservation officer, historic buildings and a Surveyor, mining with first shipment. The patient will receive a drug information handout for each medication shipped and additional FDA Medication Guides as required.       DISEASE/MEDICATION-SPECIFIC INFORMATION        N/A    SPECIALTY MEDICATION ADHERENCE     Medication Adherence    Specialty Medication: tacrolimus 1mg   Patient is on additional specialty medications: No  Patient is on more than two specialty medications: No              Were doses missed due to medication being on hold? No    tacrolimus 1 mg: 14 days of medicine on hand         REFERRAL TO PHARMACIST     Referral to the pharmacist: Not needed      [facility]     Shipping address confirmed in Epic.     Delivery Scheduled: Yes, Expected medication delivery date: 08/25.  However, Rx request for refills was sent to the provider as there are none remaining.     Medication will be delivered via Next Day Courier to the prescription address in Epic [facility].    Sandra Underwood   [facility] Specialty Technician

## 2021-06-18 MED ORDER — CETIRIZINE 10 MG TABLET
ORAL_TABLET | Freq: Two times a day (BID) | ORAL | 5 refills | 30.00000 days
Start: 2021-06-18 — End: ?

## 2021-06-18 MED ORDER — FAMOTIDINE 20 MG TABLET
ORAL_TABLET | Freq: Two times a day (BID) | ORAL | 5 refills | 30 days
Start: 2021-06-18 — End: ?

## 2021-06-24 MED FILL — FAMOTIDINE 20 MG TABLET: ORAL | 30 days supply | Qty: 60 | Fill #0

## 2021-06-24 MED FILL — CETIRIZINE 10 MG TABLET: ORAL | 30 days supply | Qty: 120 | Fill #0

## 2021-06-26 MED FILL — TACROLIMUS 1 MG CAPSULE, IMMEDIATE-RELEASE: ORAL | 30 days supply | Qty: 60 | Fill #0

## 2021-07-05 ENCOUNTER — Ambulatory Visit
Admit: 2021-07-05 | Discharge: 2021-07-06 | Payer: BLUE CROSS/BLUE SHIELD | Attending: Student in an Organized Health Care Education/Training Program | Primary: Student in an Organized Health Care Education/Training Program

## 2021-07-05 DIAGNOSIS — E1065 Type 1 diabetes mellitus with hyperglycemia: Principal | ICD-10-CM

## 2021-07-05 NOTE — Unmapped (Signed)
Dexcom meter and Tandem pump downloaded. POC glucose and A1C done today. PP 12:00pm. 185 mg/dL.

## 2021-07-05 NOTE — Unmapped (Signed)
Makenzy, it was great to meet you today. Here is a summary of what we discussed today:    -Keep up the great work and take care of yourself!    Rosiland Oz, MD  Fellow, Division of Endocrinology  Phone: [phone]  Fax: [phone]
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
## 2021-07-10 NOTE — Unmapped (Signed)
I saw and evaluated the patient, participating in the key portions of the service.  I reviewed the resident???s note.  I agree with the resident???s findings and plan. Mykiah Schmuck H Lakai Moree, MD

## 2021-07-12 NOTE — Unmapped (Signed)
Prescription for infusion sets and cartridges submitted to [facility] via [employer], forwarded to provider for signature.

## 2021-07-16 NOTE — Unmapped (Signed)
[facility] Refill Coordination Note    Specialty Medication(s) to be Shipped:   Transplant: tacrolimus 1mg     Other medication(s) to be shipped: Cetirizine 10mg ,Famotidine 20mg      Sandra Underwood, DOB: [birth year]  Phone: [phone] (home)       All above HIPAA information was verified with patient.     Was a Nurse, learning disability used for this call? No    Completed refill call assessment today to schedule patient's medication shipment from the [facility] [phone]).  All relevant notes have been reviewed.     Specialty medication(s) and dose(s) confirmed: Regimen is correct and unchanged.   Changes to medications: Sandra Underwood reports no changes at this time.  Changes to insurance: No  New side effects reported not previously addressed with a pharmacist or physician: None reported  Questions for the pharmacist: No    Confirmed patient received a Conservation officer, historic buildings and a Surveyor, mining with first shipment. The patient will receive a drug information handout for each medication shipped and additional FDA Medication Guides as required.       DISEASE/MEDICATION-SPECIFIC INFORMATION        N/A    SPECIALTY MEDICATION ADHERENCE     Medication Adherence    Patient reported X missed doses in the last month: 0  Specialty Medication: tacrolimus 1mg   Patient is on additional specialty medications: No  Patient is on more than two specialty medications: No  Any gaps in refill history greater than 2 weeks in the last 3 months: no  Demonstrates understanding of importance of adherence: yes  Informant: patient  Reliability of informant: reliable  Provider-estimated medication adherence level: good  Patient is at risk for Non-Adherence: No  Reasons for non-adherence: no problems identified              Were doses missed due to medication being on hold? No    tacrolimus 1 mg: 10 days of medicine on hand         REFERRAL TO PHARMACIST     Referral to the pharmacist: Not needed      [facility] Shipping address confirmed in Epic.     Delivery Scheduled: Yes, Expected medication delivery date: 09/20.     Medication will be delivered via Next Day Courier to the prescription address in Epic WAM.    Antonietta Barcelona   [facility] Specialty Technician

## 2021-07-17 NOTE — Unmapped (Signed)
Authenticity letter placed to provider's folder today.

## 2021-07-22 MED FILL — CETIRIZINE 10 MG TABLET: ORAL | 30 days supply | Qty: 120 | Fill #1

## 2021-07-22 MED FILL — FAMOTIDINE 20 MG TABLET: ORAL | 30 days supply | Qty: 60 | Fill #1

## 2021-07-22 MED FILL — TACROLIMUS 1 MG CAPSULE, IMMEDIATE-RELEASE: ORAL | 30 days supply | Qty: 60 | Fill #1

## 2021-07-29 NOTE — Unmapped (Signed)
Prescription for Dexcom G transmitter, sensor, and receiver submitted to [facility], forwarded to provider for signature.

## 2021-08-12 NOTE — Unmapped (Signed)
[facility] Refill Coordination Note    Specialty Medication(s) to be Shipped:   Transplant: tacrolimus 1mg     Other medication(s) to be shipped: Famotidine 20mg ,Humalog,Cetirizine 10mg      Sandra Underwood, DOB: 16-Sep-2001  Phone: [phone] (home)       All above HIPAA information was verified with patient.     Was a Nurse, learning disability used for this call? No    Completed refill call assessment today to schedule patient's medication shipment from the [facility] [phone]).  All relevant notes have been reviewed.     Specialty medication(s) and dose(s) confirmed: Regimen is correct and unchanged.   Changes to medications: Rekita reports no changes at this time.  Changes to insurance: No  New side effects reported not previously addressed with a pharmacist or physician: None reported  Questions for the pharmacist: No    Confirmed patient received a Conservation officer, historic buildings and a Surveyor, mining with first shipment. The patient will receive a drug information handout for each medication shipped and additional FDA Medication Guides as required.       DISEASE/MEDICATION-SPECIFIC INFORMATION        N/A    SPECIALTY MEDICATION ADHERENCE     Medication Adherence    Patient reported X missed doses in the last month: 0  Specialty Medication: tacrolimus 1mg   Patient is on additional specialty medications: No  Patient is on more than two specialty medications: No  Any gaps in refill history greater than 2 weeks in the last 3 months: no  Demonstrates understanding of importance of adherence: yes  Informant: patient  Reliability of informant: reliable  Provider-estimated medication adherence level: good  Patient is at risk for Non-Adherence: No  Reasons for non-adherence: no problems identified              Were doses missed due to medication being on hold? No    tacrolimus 1 mg: 10 days of medicine on hand         REFERRAL TO PHARMACIST     Referral to the pharmacist: Not needed      [facility]     Shipping address confirmed in Epic.     Delivery Scheduled: Yes, Expected medication delivery date: 10/14.     Medication will be delivered via Same Day Courier to the prescription address in Epic WAM.    Antonietta Barcelona   [facility] Specialty Technician

## 2021-08-13 ENCOUNTER — Ambulatory Visit: Admit: 2021-08-13 | Discharge: 2021-08-14 | Payer: BLUE CROSS/BLUE SHIELD

## 2021-08-13 NOTE — Unmapped (Signed)
1) Myopia OD/OS  -- finalized CL Rx  -- patient re-educated on proper lens care and hygiene  -- RTC in 8 months with Dr. Sheppard Evens for comprehensive eye exam

## 2021-08-15 NOTE — Unmapped (Signed)
[place] [facility]  Network Health Plan (NHP) Maintenance      DM lab timeline targets updated per program requirements.       Kentrell Guettler, CPhT  Clinical Operations Specialist  [place] [facility] ([facility])  p [phone]  -  f [phone]

## 2021-08-16 MED FILL — TACROLIMUS 1 MG CAPSULE, IMMEDIATE-RELEASE: ORAL | 30 days supply | Qty: 60 | Fill #2

## 2021-08-16 MED FILL — CETIRIZINE 10 MG TABLET: ORAL | 30 days supply | Qty: 120 | Fill #2

## 2021-08-16 MED FILL — FAMOTIDINE 20 MG TABLET: ORAL | 30 days supply | Qty: 60 | Fill #2

## 2021-08-16 MED FILL — HUMALOG U-100 INSULIN 100 UNIT/ML SUBCUTANEOUS SOLUTION: 90 days supply | Qty: 90 | Fill #1

## 2021-08-19 DIAGNOSIS — E1069 Type 1 diabetes mellitus with other specified complication: Principal | ICD-10-CM

## 2021-08-19 MED ORDER — NORETHINDRONE ACETATE 1 MG-ETHINYL ESTRADIOL 20 MCG TABLET
ORAL_TABLET | Freq: Every day | ORAL | 1 refills | 84 days | Status: CP
Start: 2021-08-19 — End: ?
  Filled 2021-08-27: qty 84, 84d supply, fill #0

## 2021-08-19 NOTE — Unmapped (Signed)
Pt has previously tolerated     Patient is requesting the following refill  Requested Prescriptions     Pending Prescriptions Disp Refills   ??? norethindrone-ethinyl estradiol (JUNEL 1/20, 21,) 1-20 mg-mcg per tablet 84 tablet 2     Sig: Take 1 tablet by mouth daily.       Recent Visits  Date Type Provider Dept   04/03/21 Office Visit Amie Portland, FNP [facility]   02/05/21 Office Visit Amie Portland, FNP [facility]   01/02/21 Appointment [facility] [place]   12/25/20 Office Visit [facility]   09/19/20 Office Visit Laneta Simmers [facility]   Showing recent visits within past 365 days with a meds authorizing provider and meeting all other requirements  Future Appointments  Date Type Provider Dept   10/01/21 Appointment Amie Portland, FNP [facility]   Showing future appointments within next 365 days with a meds authorizing provider and meeting all other requirements

## 2021-09-03 NOTE — Unmapped (Signed)
[facility] Refill Coordination Note    Specialty Medication(s) to be Shipped:   Transplant: tacrolimus 1mg     Other medication(s) to be shipped: Cetirizine 10mg ,Famotidine 20mg      Sandra Underwood, DOB: 20-Aug-2001  Phone: [phone] (home)       All above HIPAA information was verified with patient.     Was a Nurse, learning disability used for this call? No    Completed refill call assessment today to schedule patient's medication shipment from the [facility] [phone]).  All relevant notes have been reviewed.     Specialty medication(s) and dose(s) confirmed: Regimen is correct and unchanged.   Changes to medications: Sandra Underwood reports no changes at this time.  Changes to insurance: No  New side effects reported not previously addressed with a pharmacist or physician: None reported  Questions for the pharmacist: No    Confirmed patient received a Conservation officer, historic buildings and a Surveyor, mining with first shipment. The patient will receive a drug information handout for each medication shipped and additional FDA Medication Guides as required.       DISEASE/MEDICATION-SPECIFIC INFORMATION        N/A    SPECIALTY MEDICATION ADHERENCE     Medication Adherence    Patient reported X missed doses in the last month: 0  Specialty Medication: tacrolimus 1mg   Patient is on additional specialty medications: No  Patient is on more than two specialty medications: No  Any gaps in refill history greater than 2 weeks in the last 3 months: no  Demonstrates understanding of importance of adherence: yes  Informant: patient  Reliability of informant: reliable  Provider-estimated medication adherence level: good  Patient is at risk for Non-Adherence: No  Reasons for non-adherence: no problems identified  Confirmed plan for next specialty medication refill: delivery by pharmacy  Refills needed for supportive medications: not needed          Refill Coordination    Has the Patients' Contact Information Changed: No  Is the Shipping Address Different: No         Were doses missed due to medication being on hold? No    Tacrolimus 1 mg: 12 days of medicine on hand         REFERRAL TO PHARMACIST     Referral to the pharmacist: Not needed      [facility]     Shipping address confirmed in Epic.     Delivery Scheduled: Yes, Expected medication delivery date: 11/09.     Medication will be delivered via Next Day Courier to the prescription address in Epic WAM.    Sandra Underwood   [facility] Specialty Technician

## 2021-09-09 MED ORDER — LEVOTHYROXINE 25 MCG TABLET
ORAL_TABLET | Freq: Every day | ORAL | 3 refills | 90 days
Start: 2021-09-09 — End: 2022-09-09

## 2021-09-10 MED ORDER — LEVOTHYROXINE 25 MCG TABLET
ORAL_TABLET | Freq: Every day | ORAL | 3 refills | 90 days | Status: CP
Start: 2021-09-10 — End: 2022-09-10
  Filled 2021-09-30: qty 90, 90d supply, fill #0

## 2021-09-10 MED FILL — TACROLIMUS 1 MG CAPSULE, IMMEDIATE-RELEASE: ORAL | 30 days supply | Qty: 60 | Fill #3

## 2021-09-10 MED FILL — CETIRIZINE 10 MG TABLET: ORAL | 30 days supply | Qty: 120 | Fill #3

## 2021-09-10 MED FILL — FAMOTIDINE 20 MG TABLET: ORAL | 30 days supply | Qty: 60 | Fill #3

## 2021-09-13 NOTE — Unmapped (Signed)
A1c <8%. Patient placed in maintenance phase. Will be checking A1c again in February 2023.      Tenna Delaine, CPhT  Clinical Operations Specialist  [place] [facility] (C[facility])  p [phone]  -  f [phone]

## 2021-10-01 ENCOUNTER — Ambulatory Visit: Admit: 2021-10-01 | Payer: BLUE CROSS/BLUE SHIELD

## 2021-10-01 NOTE — Unmapped (Signed)
[facility] Refill Coordination Note    Specialty Medication(s) to be Shipped:   Transplant: tacrolimus 1mg     Other medication(s) to be shipped: pedcid, cetirizine     Sandra Underwood, DOB: [birth year]  Phone: [phone] (home)       All above HIPAA information was verified with patient.     Was a Nurse, learning disability used for this call? No    Completed refill call assessment today to schedule patient's medication shipment from the [facility] [phone]).  All relevant notes have been reviewed.     Specialty medication(s) and dose(s) confirmed: Regimen is correct and unchanged.   Changes to medications: Sandra Underwood reports no changes at this time.  Changes to insurance: No  New side effects reported not previously addressed with a pharmacist or physician: None reported  Questions for the pharmacist: No    Confirmed patient received a Conservation officer, historic buildings and a Surveyor, mining with first shipment. The patient will receive a drug information handout for each medication shipped and additional FDA Medication Guides as required.       DISEASE/MEDICATION-SPECIFIC INFORMATION        N/A    SPECIALTY MEDICATION ADHERENCE     Medication Adherence    Patient reported X missed doses in the last month: 0  Specialty Medication: TACROLIMUS 1MG   Patient is on additional specialty medications: No  Patient is on more than two specialty medications: No              Were doses missed due to medication being on hold? No    TACROLIMUS 1MG :  10 days worth of medication on hand.      REFERRAL TO PHARMACIST     Referral to the pharmacist: Not needed      [facility]     Shipping address confirmed in Epic.     Delivery Scheduled: Yes, Expected medication delivery date: 10/08/21.     Medication will be delivered via UPS to the prescription address in Epic WAM.    Sandra Underwood   [facility] Specialty Technician

## 2021-10-07 MED FILL — CETIRIZINE 10 MG TABLET: ORAL | 30 days supply | Qty: 120 | Fill #4

## 2021-10-07 MED FILL — TACROLIMUS 1 MG CAPSULE, IMMEDIATE-RELEASE: ORAL | 30 days supply | Qty: 60 | Fill #4

## 2021-10-07 MED FILL — FAMOTIDINE 20 MG TABLET: ORAL | 30 days supply | Qty: 60 | Fill #4

## 2021-10-22 NOTE — Unmapped (Signed)
[name] - T1DM Return Visit    Assessment and Plan:  Sandra Underwood is a 20 y.o. female with PMHx of T1DM and chronic urticaria on tacrolimus who is being seen in follow-up for T1DM.    1. Type 1 diabetes  A1c: HbA1c 6.5%, previously 6.7% (07/2021). Goal HbA1c <7.0% without hypoglycemia.  Brief DM History: Diagnosed with T1DM at age 82, previously poorly controlled due to anxiety/depression, wanted to be like her peers and not deal with diabetes, but has done extremely well since starting pump therapy in 2021 and feels confident in her diabetes management and incorporation into her lifestyle.  Current Regimen: Tandem Pump with Control IQ (See Data Review below for pump settings.)  CGM Review: CGM downloaded and interpreted x 14 days. Daily trends reviewed including patterns. Patterns include: MBG 194, SD 79, 46% TIR, 2.5% low, 52% high, 96% time active. Recent 1-2 day period of hyperglycemia when she was waiting on new infusion sets to arrive and switched to pens, other post-prandial hyperglycemia when forgetting to bolus.  Today: Overall, she is doing well with A1c at target, though CGM review reveals more hyperglycemia than might be expected by A1c, driven mostly by post-prandial excursions after forgetting to bolus.  -No changes to pump settings today, encouraged trying to remember to bolus for carbohydrate intake, which she thinks will be easier after the holidays.  -We discussed bolusing via pump if planning to take off for a few hours and overlapping toward end of four hour off-pump period before resuming pump therapy.  -She would like to consider transitioning to OmniPod 5, will schedule with CDE.  -She has backup pens of both long- and short-acting insulin available and feels comfortable with pump backup plan.  -She is on OCPs for contraception    *The patient is currently using the therapeutic CGM as prescribed.  *The patient has diabetes mellitus.  *The patient is insulin-treated with 2 or more daily injections of insulin(MDII).   *The patient has been using a home blood glucose monitor (BGM) and has performed tests 4 times per day or more prior to starting use of a CGM.   *The patient???s insulin treatment regimen requires frequent adjustments by the patient on the basis of BGM and/or therapeutic CGM testing results.   *Within the last six months, I had an in-person visit with the beneficiary to evaluate their diabetes control and determine that the above criteria are met.   *Every six months I will continue to have an in-person visit with the beneficiary to assess adherence to their CGM regimen and diabetes treatment plan.    2. Diabetes-related complications and prevention  Microvascular Complications  - Eyes: Last eye exam 04/19/2021 with [facility]. No disease. Due 04/2021.  - Kidneys: Last urine MA/C negative (02/2021) and GFR >90 (02/2021). Due 02/2022.  - Feet: Despina reports performed at last visit with Dr. Alphia Kava. Due 04/2022.  Other Risks for Macrovascular Complications  - BP: WNL  - Lipids/ASCVD risk: Last lipids with LDL 69, HDL 54, TChol 147, TG 118 (02/2021). Due 02/2022.    Return in about 3 months (around 01/21/2022).    Patient was seen and discussed with Dr. Concepcion Elk.    Rosiland Oz, MD, PhD  PGY-3, Division of Endocrinology  Phone: [phone]  Fax: [phone]    Subjective:  Sandra Underwood is a 20 y.o. female with PMHx of T1DM and chronic urticaria on tacrolimus who is being seen in follow-up for T1DM.    Initial history  obtained by Patric Dykes on 03/01/2018: Ms. Hepworth is a senior in [employer] taking college courses at her local community college in anticipation of a attending [employer] in the Fall of 2019 for business and Social research officer, government. She is interested in becoming a Best boy.  ??  The patient was initially diagnosed with Type 1 diabetes mellitus in November 2014.   History of hospitalizations for DKA: yes x3 in 2018, 2/2 UTI x2 and food poisoning x1.  FH of autoimmune dysfunction: yes - Mom has ulcerative colitis.  Hx of auto-immune co-morbidities: No.   ??  Current symptoms/problems: none  Known diabetic complications: none  Current diabetic medications: Lantus pen 32u @ HS without misses doses, Novolog 1u:8g carbs and 1u:50points >150. Taking Novolog 1-2x/day.   ??  Previous medication trials: metformin for 1 day at diagnosis.  ??  Eye exam current (within one year): yes - Dr. Dede Query, [facility] in 07/2017. No hx of DR. No vision change. 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   ??  Menstrual cycle: Menarche@ 20yo, OBCP @20yo , taking Junel 21d w/o placebo week per GYN. Has menses once every 6-9 months.   ??  Current monitoring regimen: Freestyle Libre with receiver on her phone  Blood glucose meter to visit: phone  Download: Not able to download from phone at this time. Phone reviewed manually. Checking 1-2x/day. FBG 111-160mg /dl.   ??  Any episodes of severe hypoglycemia (requiring the assistance of others)? no  Hypoglycemic awareness: 70mg /dl  ??  ACE inhibitor or angiotensin II receptor blocker? no  Statin? no  ASA? no  Weight trend: stable  Current diet: high carb 30g -B, 90g - L, 150g - D.   Exercise: no. Barriers to exercise: none    TODAY (10/23/21): She moved and is living in an apartment in [place], gets to see her friends all the time. 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 She's been getting back into dating and sometimes has taken her pump off for a short time and forgot to put it back on.     ROS  All other systems were reviewed and are negative except as noted above.    Past Medical History:   Diagnosis Date   ??? Allergic conjunctivitis    ??? Allergic rhinitis    ??? Arthritis     ankles and elbows   ??? DKA (diabetic ketoacidoses)    ??? DM (diabetes mellitus), type 1 (CMS-HCC)    ??? Food allergy     lemon   ??? Type 1 Duane's syndrome      Past Surgical History:   Procedure Laterality Date   ??? TONSILLECTOMY AND ADENOIDECTOMY  10/2015     Family History   Problem Relation Age of Onset   ??? Ulcerative colitis Mother    ??? Asthma Maternal Grandfather    ??? Hypertension Maternal Grandfather    ??? Cataracts Neg Hx    ??? Glaucoma Neg Hx      Social History     Socioeconomic History   ??? Marital status: Single   Tobacco Use   ??? Smoking status: Never   ??? Smokeless tobacco: Never   ??? Tobacco comments:     once every 2 months   Vaping Use   ??? Vaping Use: Never used   Substance and Sexual Activity   ??? Alcohol use: Yes     Comment: hard liquor once a month- social   ???  Drug use: Not Currently   ??? Sexual activity: Yes     Partners: Male     Birth control/protection: OCP, Condom     Allergies   Allergen Reactions   ??? Amoxicillin-Pot Clavulanate Rash   ??? Grass Pollen Hives   ??? Lemon Hives   ??? Malt Extract Hives   ??? Metronidazole Hives   ??? Peanut Hives   ??? Tree And Shrub Pollen Hives   ??? Amoxicillin Rash   ??? Penicillins Rash     Current Outpatient Medications   Medication Instructions   ??? acetaminophen (TYLENOL) 1,000 mg, Oral, Every 8 hours PRN   ??? blood sugar diagnostic Strp Use to check blood sugar four (4) times a day (before meals and nightly).   ??? blood-glucose sensor (DEXCOM G6 SENSOR) Devi 1 each, Miscellaneous, Every 10 days   ??? blood-glucose transmitter (DEXCOM G6 TRANSMITTER) Devi 1 each, Miscellaneous, Every 3 months   ??? cetirizine (ZYRTEC) 10 MG tablet Take 1-2 tablets (10-20 mg total) by mouth two (2) times a day for hives.   ??? cetirizine (ZYRTEC) 20 mg, Oral, 2 times a day PRN   ??? famotidine (PEPCID AC) 20 mg, Oral, 2 times a day (standard)   ??? famotidine (PEPCID) 10 mg, Oral, 2 times a day (standard)   ??? hydrOXYzine (ATARAX) 25 MG tablet Take 1 tablet by mouth every eight hours as needed for itching   ??? insulin admin supplies (INPEN, FOR NOVOLOG OR FIASP,) InPn injection pen Dispense 1 InPen   ??? insulin glargine (LANTUS SOLOSTAR U-100 INSULIN) 100 unit/mL (3 mL) injection pen Use up to 50 units per day as per MD instructions, backup for pump failure.   ??? insulin lispro (HUMALOG KWIKPEN INSULIN) 100 unit/mL injection pen Use up to 30 units per day, as per MD instructions   ??? insulin lispro (HUMALOG U-100 INSULIN) 100 unit/mL injection Use up to 100 units/day, divided into 3 times daily before meals as per MD instructions   ??? lancets (ONETOUCH DELICA LANCETS) 33 gauge Misc Use to check bloof sugar 6 times daily   ??? levocetirizine (XYZAL) 5 MG tablet Take 2 tablets by mouth twice a day for urticaria   ??? levothyroxine (SYNTHROID) 25 mcg, Oral, Daily (standard)   ??? magnesium 30 mg, Oral, 2 times a day (standard)   ??? montelukast (SINGULAIR) 10 mg, Oral, Nightly   ??? norethindrone-ethinyl estradiol (JUNEL 1/20, 21,) 1-20 mg-mcg per tablet 1 tablet, Oral, Daily (standard)   ??? pen needle, diabetic (BD ULTRA-FINE NANO PEN NEEDLE) 32 gauge x 5/32 (4 mm) Ndle ok to sub any brand or size needle preferred by insurance/patient, use 4x/day, dx E10.65   ??? pen needle, diabetic (BD ULTRA-FINE NANO PEN NEEDLE) 32 gauge x 5/32 Ndle To administer insulin 6-8x/day   ??? predniSONE (DELTASONE) 50 mg, Oral, Daily   ??? syringe with needle 1 mL 28 gauge x 1/2 Syrg Use every 7 (seven) days.   ??? tacrolimus (PROGRAF) 1 mg, Oral, 2 times a day   ??? XOLAIR 150 mg/mL syringe No dose, route, or frequency recorded.       Objective:  Vitals:    10/23/21 1313   BP: 113/74   Pulse: 99     Wt Readings from Last 6 Encounters:   10/23/21 72.5 kg (159 lb 12.8 oz)   07/05/21 79.3 kg (174 lb 12.8 oz)   04/03/21 80.2 kg (176 lb 12.8 oz)   04/03/21 80.1 kg (176 lb 9.6 oz)   02/05/21 80.3 kg (  177 lb)   12/25/20 77.1 kg (170 lb)     Physical Exam  Vitals reviewed.   Constitutional:       General: She is not in acute distress.     Appearance: She is not ill-appearing.   Cardiovascular:      Rate and Rhythm: Normal rate and regular rhythm.      Heart sounds: No murmur heard.  Pulmonary:      Effort: Pulmonary effort is normal. No respiratory distress.      Breath sounds: Normal breath sounds.   Skin:     General: Skin is warm and dry.      Comments: Injection and/or device sites are clean and intact. One small, firm area on right side of abdomen over recent infusion site.   Neurological:      Mental Status: She is alert.       Data Review: I personally reviewed all available, pertinent labs, imaging, and outside records.    Pump Settings:      CGM Data:      Portions of this record may have been created using Scientist, clinical (histocompatibility and immunogenetics). Dictation errors have been sought but may not have all been identified and corrected.

## 2021-10-23 ENCOUNTER — Ambulatory Visit
Admit: 2021-10-23 | Discharge: 2021-10-24 | Payer: BLUE CROSS/BLUE SHIELD | Attending: Student in an Organized Health Care Education/Training Program | Primary: Student in an Organized Health Care Education/Training Program

## 2021-10-23 DIAGNOSIS — E1065 Type 1 diabetes mellitus with hyperglycemia: Principal | ICD-10-CM

## 2021-10-23 MED ORDER — INSULIN GLARGINE (U-100) 100 UNIT/ML (3 ML) SUBCUTANEOUS PEN
12 refills | 0.00000 days | Status: CP
Start: 2021-10-23 — End: ?
  Filled 2021-10-29: qty 45, 90d supply, fill #0

## 2021-10-23 NOTE — Unmapped (Signed)
As part of your ???back up??? plan for your insulin pump, ie in case you experience an unexpected interruption of pump therapy for any reason, it is important to always have on hand:  Syringes to draw rapid-acting insulin from your rapid-actin insulin vials.  A pen or vial of once-daily long-acting insulin (such as Lantus, Basaglar, Semglee, or Tresiba insulin) to use once every 24 hours until you can resume pump therapy. If you choose to have a pen, instead of a vial, you will need to keep pen needles on hand as well.    If you experience an unexpected interruption of the pump, you will need to:  Inject your rapid-acting insulin (eg Humalog, Novolog, Apidra, or Fiasp) via syringe/vial every 3-4 hours based on your carbohydrate intake and blood glucose values, with the goal to keep the glucose between 100 mg/dL - 045 mg/dL in the short term.  Use the same carbohydrate ratio and correction factor (or sensitivity factor) as was programmed in your pump.  If you anticipate being off the pump for > 24-36 hours, you should also take a long-acting once daily long-acting insulin (ie Lantus, Basaglar, Semglee, or Tresiba insulin) at approximately the same ???total basal insulin??? dose that you have been receiving most days through your pump.  For example, if your average ???total basal insulin??? dose is 30 units per day in recent days/weeks, you need to inject ~ 30 units per day of once daily long-acting insulin every 24 hours while off the pump.  When re-starting the pump, the pump is usually resumed about 1-2 hours before the next long-acting insulin injection would have been due.  Resume the pump as usual as soon as a functioning pump with appropriate supplies is available.  Proceed to ER or call 911 if blood glucose is persistently high OR there is associated nausea/vomiting/abdominal pain or other signs of diabetic ketoacidosis.    General Clinic Information:    Business Hours  Monday: 7:30 AM - 4:30 PM  Tuesday: 7:30 AM - 4:30 PM  Wednesday: 7:30 AM - 4:30 PM  Thursday: 7:30 AM - 4:30 PM  Friday: 7:30 AM - 4:30 PM  Saturday: Closed  Sunday: Closed     Phone: [phone]  Fax: [phone]     After Hours, Weekend, or Holidays: For urgent requests that need attention before next business day only.  Call Hospital Operator [phone] and ask for Endocrinology Fellow on call.

## 2021-10-24 NOTE — Unmapped (Signed)
I saw and evaluated the patient, participating in the key portions of the service.  I reviewed the fellow's note.  I agree with the fellow's findings and plan. Tyrice Hewitt G Rodriguez, MD

## 2021-11-09 NOTE — Unmapped (Signed)
[facility] Clinical Assessment & Refill Coordination Note    Sandra Underwood, [place]: May 21, 2001  Phone: [phone] (home)     All above HIPAA information was verified with patient.     Was a Nurse, learning disability used for this call? No    Specialty Medication(s):   Inflammatory Disorders: tacrolimus     Current Outpatient Medications   Medication Sig Dispense Refill   ??? acetaminophen (TYLENOL) 500 MG tablet Take 2 tablets (1,000 mg total) by mouth every eight (8) hours as needed for pain. 30 tablet 0   ??? blood sugar diagnostic Strp Use to check blood sugar four (4) times a day (before meals and nightly). 300 each 12   ??? blood-glucose sensor (DEXCOM G6 SENSOR) Devi 1 each by Miscellaneous route every ten (10) days. 9 each 2   ??? blood-glucose transmitter (DEXCOM G6 TRANSMITTER) Devi 1 each by Miscellaneous route Every three (3) months. 1 each 3   ??? cetirizine (ZYRTEC) 10 MG tablet Take 20 mg by mouth two (2) times a day as needed for allergies.     ??? cetirizine (ZYRTEC) 10 MG tablet Take 1-2 tablets (10-20 mg total) by mouth two (2) times a day for hives. 120 tablet 5   ??? famotidine (PEPCID AC) 20 MG tablet Take 1 tablet (20 mg total) by mouth Two (2) times a day. 60 tablet 5   ??? famotidine (PEPCID) 10 MG tablet Take 10 mg by mouth Two (2) times a day.     ??? hydrOXYzine (ATARAX) 25 MG tablet Take 1 tablet by mouth every eight hours as needed for itching 60 tablet 3   ??? insulin admin supplies (INPEN, FOR NOVOLOG OR FIASP,) InPn injection pen Dispense 1 InPen 1 each 0   ??? insulin glargine (LANTUS SOLOSTAR U-100 INSULIN) 100 unit/mL (3 mL) injection pen Use up to 50 units per day as per MD instructions, backup for pump failure. 45 mL 12   ??? insulin lispro (HUMALOG KWIKPEN INSULIN) 100 unit/mL injection pen Use up to 30 units per day, as per MD instructions 15 mL 12   ??? insulin lispro (HUMALOG U-100 INSULIN) 100 unit/mL injection Use up to 100 units/day, divided into 3 times daily before meals as per MD instructions 90 mL 12   ??? lancets (ONETOUCH DELICA LANCETS) 33 gauge Misc Use to check bloof sugar 6 times daily 200 each 3   ??? levocetirizine (XYZAL) 5 MG tablet Take 2 tablets by mouth twice a day for urticaria 120 tablet 5   ??? levothyroxine (SYNTHROID) 25 MCG tablet Take 1 tablet (25 mcg total) by mouth daily. 90 tablet 3   ??? magnesium 30 mg tablet Take 30 mg by mouth Two (2) times a day.     ??? montelukast (SINGULAIR) 10 mg tablet Take 1 tablet (10 mg total) by mouth nightly for 20 days. 20 tablet 0   ??? norethindrone-ethinyl estradiol (JUNEL 1/20, 21,) 1-20 mg-mcg per tablet Take 1 tablet by mouth daily. 84 tablet 1   ??? pen needle, diabetic (BD ULTRA-FINE NANO PEN NEEDLE) 32 gauge x 5/32 (4 mm) Ndle ok to sub any brand or size needle preferred by insurance/patient, use 4x/day, dx E10.65 100 each 12   ??? pen needle, diabetic (BD ULTRA-FINE NANO PEN NEEDLE) 32 gauge x 5/32 Ndle To administer insulin 6-8x/day 200 each 3   ??? predniSONE (DELTASONE) 50 MG tablet Take 50 mg by mouth in the morning.     ??? syringe with needle 1  mL 28 gauge x 1/2 Syrg Use every 7 (seven) days.     ??? tacrolimus (PROGRAF) 1 MG capsule Take 1 capsule (1 mg total) by mouth two (2) times a day. 60 capsule 5   ??? XOLAIR 150 mg/mL syringe        No current facility-administered medications for this visit.        Changes to medications: Nyeema reports no changes at this time.    Allergies   Allergen Reactions   ??? Amoxicillin-Pot Clavulanate Rash   ??? Grass Pollen Hives   ??? Lemon Hives   ??? Malt Extract Hives   ??? Metronidazole Hives   ??? Peanut Hives   ??? Tree And Shrub Pollen Hives   ??? Amoxicillin Rash   ??? Penicillins Rash       Changes to allergies: No    SPECIALTY MEDICATION ADHERENCE     tacrolimus 1 mg: 14 days of medicine on hand     Medication Adherence    Patient reported X missed doses in the last month: 0  Specialty Medication: tacrolimus 1 mg - 1 cap BID  Patient is on additional specialty medications: No  Informant: patient Specialty medication(s) dose(s) confirmed: Regimen is correct and unchanged.     Are there any concerns with adherence? No    Adherence counseling provided? Not needed    CLINICAL MANAGEMENT AND INTERVENTION      Clinical Benefit Assessment:    Do you feel the medicine is effective or helping your condition? Yes    Clinical Benefit counseling provided? Not needed    Adverse Effects Assessment:    Are you experiencing any side effects? No    Are you experiencing difficulty administering your medicine? 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 For example, brushing your teeth or getting up in the morning. 0    Have you discussed this with your provider? Not needed    Acute Infection Status:    Acute infections noted within Epic:  No active infections  Patient reported infection: None    Therapy Appropriateness:    Is therapy appropriate and patient progressing towards therapeutic goals? Yes, therapy is appropriate and should be continued    DISEASE/MEDICATION-SPECIFIC INFORMATION      N/A    PATIENT SPECIFIC NEEDS     - Does the patient have any physical, cognitive, or cultural barriers? No    - Is the patient high risk? No    - Does the patient require a Care Management Plan? No     SOCIAL DETERMINANTS OF HEALTH     At the [facility], we have learned that life circumstances - like trouble affording food, housing, utilities, or transportation can affect the health of many of our patients.   That is why we wanted to ask: are you currently experiencing any life circumstances that are negatively impacting your health and/or quality of life? Patient declined to answer    Social Determinants of Health     Food Insecurity: Not on file   Tobacco Use: Low Risk    ??? Smoking Tobacco Use: Never   ??? Smokeless Tobacco Use: Never   ??? Passive Exposure: Not on file   Transportation Needs: Not on file  Alcohol Use: Not At Risk   ??? How often do you have a drink containing alcohol?: 2 - 4 times per month   ??? How many drinks containing alcohol do you have on a typical day when you are drinking?: 3 - 4   ??? How often do you have 5 or more drinks on one occasion?: Never   Housing/Utilities: Not on file   Substance Use: Not on file   Financial Resource Strain: Not on file   Physical Activity: Not on file   Health Literacy: Not on file   Stress: Not on file   Intimate Partner Violence: Not on file   Depression: Not on file   Social Connections: Not on file       Would you be willing to receive help with any of the needs that you have identified today? Not applicable       SHIPPING     Specialty Medication(s) to be Shipped:   Inflammatory Disorders: tacrolimus    Other medication(s) to be shipped: zrytec, famotidine 10 mg. Junel 1/20, and Humalog     Changes to insurance: No    Delivery Scheduled: Yes, Expected medication delivery date: 11/13/21.     Medication will be delivered via UPS to the confirmed prescription address in [facility].    The patient will receive a drug information handout for each medication shipped and additional FDA Medication Guides as required.  Verified that patient has previously received a Conservation officer, historic buildings and a Surveyor, mining.    The patient or caregiver noted above participated in the development of this care plan and knows that they can request review of or adjustments to the care plan at any time.      All of the patient's questions and concerns have been addressed.    Oliva Bustard   [facility] Specialty Pharmacist

## 2021-11-12 DIAGNOSIS — E1065 Type 1 diabetes mellitus with hyperglycemia: Principal | ICD-10-CM

## 2021-11-12 NOTE — Unmapped (Signed)
Sandra Underwood 's entire shipment will be delayed as a result of a high copay.     I have reached out to the patient  at [phone] and communicated the delivery change. We will reschedule the medication for the delivery date that the patient agreed upon.  We have confirmed the delivery date as 11/14/2021, via ups.     Currently Humalog showing $600 copay for 90 days supply. Pending assistance. Pt Ok to separate other meds tomorrow morning if nothing back on humalog yet to prevent further delays on other meds.

## 2021-11-13 MED FILL — FAMOTIDINE 20 MG TABLET: ORAL | 30 days supply | Qty: 60 | Fill #5

## 2021-11-13 MED FILL — NORETHINDRONE ACETATE 1 MG-ETHINYL ESTRADIOL 20 MCG TABLET: ORAL | 84 days supply | Qty: 84 | Fill #1

## 2021-11-13 MED FILL — HUMALOG U-100 INSULIN 100 UNIT/ML SUBCUTANEOUS SOLUTION: 30 days supply | Qty: 30 | Fill #2

## 2021-11-13 MED FILL — TACROLIMUS 1 MG CAPSULE, IMMEDIATE-RELEASE: ORAL | 30 days supply | Qty: 60 | Fill #5

## 2021-11-13 MED FILL — CETIRIZINE 10 MG TABLET: ORAL | 30 days supply | Qty: 120 | Fill #5

## 2021-11-29 NOTE — Unmapped (Signed)
Complex Case Management  SUMMARY NOTE    Attempted to contact pt today at Cell number to introduce Complex Case Management services. Left message to return call.; 1st attempt    Discuss at next visit: Introduction to Complex Case Management

## 2021-12-04 MED ORDER — TACROLIMUS 1 MG CAPSULE, IMMEDIATE-RELEASE
ORAL_CAPSULE | Freq: Two times a day (BID) | ORAL | 5 refills | 30.00000 days
Start: 2021-12-04 — End: ?

## 2021-12-04 NOTE — Unmapped (Signed)
Complex Case Management  SUMMARY NOTE    Complex Case Manager spoke with patient and verified correct patient using two identifiers today to introduce the Complex Case Management program.     Discussed the following:  Program Services    Program status: Declined and letter sent

## 2021-12-04 NOTE — Unmapped (Signed)
[facility] Refill Coordination Note    Specialty Medication(s) to be Shipped:   Transplant: tacrolimus 1mg     Other medication(s) to be shipped: No additional medications requested for fill at this time     Sandra Underwood, DOB: 2001/05/25  Phone: [phone] (home)       All above HIPAA information was verified with patient.     Was a Nurse, learning disability used for this call? No    Completed refill call assessment today to schedule patient's medication shipment from the [facility] [phone]).  All relevant notes have been reviewed.     Specialty medication(s) and dose(s) confirmed: Regimen is correct and unchanged.   Changes to medications: [name] reports no changes at this time.  Changes to insurance: No  New side effects reported not previously addressed with a pharmacist or physician: None reported  Questions for the pharmacist: No    Confirmed patient received a Conservation officer, historic buildings and a Surveyor, mining with first shipment. The patient will receive a drug information handout for each medication shipped and additional FDA Medication Guides as required.       DISEASE/MEDICATION-SPECIFIC INFORMATION        N/A    SPECIALTY MEDICATION ADHERENCE     Medication Adherence    Patient reported X missed doses in the last month: 0  Specialty Medication: Tacrolimus  Patient is on additional specialty medications: No              Were doses missed due to medication being on hold? No    tacrolimus 1 mg: 14 days of medicine on hand        REFERRAL TO PHARMACIST     Referral to the pharmacist: Not needed      [facility]     Shipping address confirmed in Epic.     Delivery Scheduled: Yes, Expected medication delivery date: 12/12/21.     Medication will be delivered via UPS to the prescription address in Epic WAM.    [name]   [facility] Specialty Technician

## 2021-12-11 MED FILL — TACROLIMUS 1 MG CAPSULE, IMMEDIATE-RELEASE: ORAL | 30 days supply | Qty: 60 | Fill #0

## 2021-12-12 NOTE — Unmapped (Signed)
[place] [facility] ([facility]  Network Health Plan (NHP) Maintenance      DM lab timeline targets updated per program requirements.         Tenna Delaine, CPhT   Certified Pharmacy Technician   [place] [facility]ations [facility] (CAMP)   P [phone], F [phone]

## 2021-12-25 MED FILL — HUMALOG U-100 INSULIN 100 UNIT/ML SUBCUTANEOUS SOLUTION: 30 days supply | Qty: 30 | Fill #3

## 2021-12-26 MED FILL — HUMALOG KWIKPEN (U-100) INSULIN 100 UNIT/ML SUBCUTANEOUS: 50 days supply | Qty: 15 | Fill #0

## 2021-12-30 MED ORDER — FAMOTIDINE 20 MG TABLET
ORAL_TABLET | Freq: Two times a day (BID) | ORAL | 5 refills | 30 days
Start: 2021-12-30 — End: ?

## 2021-12-30 MED ORDER — CETIRIZINE 10 MG TABLET
ORAL_TABLET | Freq: Two times a day (BID) | ORAL | 5 refills | 30 days
Start: 2021-12-30 — End: ?

## 2021-12-30 NOTE — Unmapped (Signed)
[facility] Refill Coordination Note    Specialty Medication(s) to be Shipped:   Transplant: tacrolimus 1mg     Other medication(s) to be shipped: cetirizne 10mg ,famotidine 20mg ,levothyroxine     Sandra Underwood, DOB: 07-May-2001  Phone: [phone] (home)       All above HIPAA information was verified with patient.     Was a Nurse, learning disability used for this call? No    Completed refill call assessment today to schedule patient's medication shipment from the [facility] [phone]).  All relevant notes have been reviewed.     Specialty medication(s) and dose(s) confirmed: Regimen is correct and unchanged.   Changes to medications: Lyzette reports no changes at this time.  Changes to insurance: No  New side effects reported not previously addressed with a pharmacist or physician: None reported  Questions for the pharmacist: No    Confirmed patient received a Conservation officer, historic buildings and a Surveyor, mining with first shipment. The patient will receive a drug information handout for each medication shipped and additional FDA Medication Guides as required.       DISEASE/MEDICATION-SPECIFIC INFORMATION        N/A    SPECIALTY MEDICATION ADHERENCE     Medication Adherence    Patient reported X missed doses in the last month: 0  Specialty Medication: tacrolimus 1mg   Patient is on additional specialty medications: No  Patient is on more than two specialty medications: No  Any gaps in refill history greater than 2 weeks in the last 3 months: no  Demonstrates understanding of importance of adherence: yes  Informant: patient  Reliability of informant: reliable  Provider-estimated medication adherence level: good  Patient is at risk for Non-Adherence: No  Reasons for non-adherence: no problems identified  Confirmed plan for next specialty medication refill: delivery by pharmacy  Refills needed for supportive medications: not needed          Refill Coordination    Has the Patients' Contact Information Changed: No  Is the Shipping Address Different: No         Were doses missed due to medication being on hold? No    Tacrolimus 1 mg: 5 days of medicine on hand         REFERRAL TO PHARMACIST     Referral to the pharmacist: Not needed      [facility]     Shipping address confirmed in Epic.     Delivery Scheduled: Yes, Expected medication delivery date: 03/03.     Medication will be delivered via UPS to the prescription address in Epic WAM.    Antonietta Barcelona   [facility] Specialty Technician

## 2022-01-02 MED FILL — TACROLIMUS 1 MG CAPSULE, IMMEDIATE-RELEASE: ORAL | 30 days supply | Qty: 60 | Fill #1

## 2022-01-02 MED FILL — FAMOTIDINE 20 MG TABLET: ORAL | 30 days supply | Qty: 60 | Fill #0

## 2022-01-02 MED FILL — LEVOTHYROXINE 25 MCG TABLET: ORAL | 90 days supply | Qty: 90 | Fill #1

## 2022-01-02 MED FILL — CETIRIZINE 10 MG TABLET: ORAL | 30 days supply | Qty: 120 | Fill #0

## 2022-01-14 NOTE — Unmapped (Signed)
Prescription for Tandem infusion sets submitted to [facility] via [facility], forwarded to provider for signature.

## 2022-01-20 NOTE — Unmapped (Signed)
Time in/out: 9:00/9:30AM  Total time: 30 minutes    Assessment/Plan:   Uncontrolled Diabetes, Type 1   Most recent A1C 6.5 -->  6.5 (01/21/2022).    Sandra Underwood has the TSlim pump with Control IQ but is currently off it after a trip to [place].  She has been doing MDI and is having more erratic control.  Per Dexcom, TIR is 42%, high or very high 54%, low or very low 2%  She presents today to learn about the Omnipod 5.    1.  I provided an overview of the Omnipod 5, comparing and contrasting with her TSlim with control IQ. Her phone is not compatible with the Omnipod app and she understands she would need to use the controller with the pod.  Compared algorithms and discussed features including suspend before low and exercise mode.  Handout provided.  [name] will discuss this with her parents and contact the office if interested in getting RX.  2.  I reviewed her CGMS and discussed how she can improve glucose stability.  I encouraged her to restart her TSlim and reminded her of the importance of starting the pump 24 hours after last Lantus dose to reduce risk of lows.    3.  Discussed ETOH impact on glucose control and how to mitigate large glycemic swings.  4.  Keep RV with Dr. Silvestre Moment next month.  Encouraged to contact the office with any questions or concerns.    PCP: Herschell Dimes, FNP    CC: Follow up Type 1 Diabetes        Subjective:      Sandra Underwood is a 20 y.o. female who presents for f/u of Type 1 diabetes mellitus.  Last seen 10/23/2021 by Dr. Silvestre Moment.  Historically uncontrolled DM but doing very well since starting pump therapy in 2021.    Diagnosed with diabetes age 69 years.      Meter downloaded and reviewed. Dexcom G6            Current Diabetes Medications   Currently off the Tandem pump    Lantus 36 units QD  Humalog 1:8 plus SS 1 unit/50>150    Diet:   Unrestricted  Drinking water mostly, some ETOH on weekends.    DM Related Complications:  None known    Eye exam last performed: 04/2021 no evidence of DR      Lab Results   Component Value Date    A1C 6.5 01/21/2022    A1C 6.5 10/23/2021    A1C 6.7 07/05/2021    A1C 7.2 (H) 04/03/2021       Wt Readings from Last 3 Encounters:   10/23/21 72.5 kg (159 lb 12.8 oz)   07/05/21 79.3 kg (174 lb 12.8 oz)   04/03/21 80.2 kg (176 lb 12.8 oz)       Past Medical History:   Diagnosis Date   ??? Allergic conjunctivitis    ??? Allergic rhinitis    ??? Arthritis     ankles and elbows   ??? DKA (diabetic ketoacidoses)    ??? DM (diabetes mellitus), type 1 (CMS-HCC)    ??? Food allergy     lemon   ??? Type 1 Duane's syndrome          Current Outpatient Medications:   ???  acetaminophen (TYLENOL) 500 MG tablet, Take 2 tablets (1,000 mg total) by mouth every eight (8) hours as needed for pain., Disp: 30 tablet, Rfl: 0  ???  blood sugar diagnostic  Strp, Use to check blood sugar four (4) times a day (before meals and nightly)., Disp: 300 each, Rfl: 12  ???  blood-glucose sensor (DEXCOM G6 SENSOR) Devi, 1 each by Miscellaneous route every ten (10) days., Disp: 9 each, Rfl: 2  ???  blood-glucose transmitter (DEXCOM G6 TRANSMITTER) Devi, 1 each by Miscellaneous route Every three (3) months., Disp: 1 each, Rfl: 3  ???  cetirizine (ZYRTEC) 10 MG tablet, Take 20 mg by mouth two (2) times a day as needed for allergies., Disp: , Rfl:   ???  cetirizine (ZYRTEC) 10 MG tablet, Take 1-2 tablets (10-20 mg total) by mouth two (2) times a day for hives., Disp: 120 tablet, Rfl: 5  ???  famotidine (PEPCID AC) 20 MG tablet, Take 1 tablet (20 mg total) by mouth Two (2) times a day., Disp: 60 tablet, Rfl: 5  ???  famotidine (PEPCID) 10 MG tablet, Take 10 mg by mouth Two (2) times a day., Disp: , Rfl:   ???  hydrOXYzine (ATARAX) 25 MG tablet, Take 1 tablet by mouth every eight hours as needed for itching, Disp: 60 tablet, Rfl: 3  ???  insulin admin supplies (INPEN, FOR NOVOLOG OR FIASP,) InPn injection pen, Dispense 1 InPen, Disp: 1 each, Rfl: 0  ???  insulin glargine (LANTUS SOLOSTAR U-100 INSULIN) 100 unit/mL (3 mL) injection pen, Use up to 50 units per day as per MD instructions, backup for pump failure., Disp: 45 mL, Rfl: 12  ???  insulin lispro (HUMALOG KWIKPEN INSULIN) 100 unit/mL injection pen, Use up to 30 units per day, as per MD instructions, Disp: 15 mL, Rfl: 12  ???  insulin lispro (HUMALOG U-100 INSULIN) 100 unit/mL injection, Use up to 100 units/day, divided into 3 times daily before meals as per MD instructions, Disp: 90 mL, Rfl: 12  ???  lancets (ONETOUCH DELICA LANCETS) 33 gauge Misc, Use to check bloof sugar 6 times daily, Disp: 200 each, Rfl: 3  ???  levocetirizine (XYZAL) 5 MG tablet, Take 2 tablets by mouth twice a day for urticaria, Disp: 120 tablet, Rfl: 5  ???  levothyroxine (SYNTHROID) 25 MCG tablet, Take 1 tablet (25 mcg total) by mouth daily., Disp: 90 tablet, Rfl: 3  ???  magnesium 30 mg tablet, Take 30 mg by mouth Two (2) times a day., Disp: , Rfl:   ???  montelukast (SINGULAIR) 10 mg tablet, Take 1 tablet (10 mg total) by mouth nightly for 20 days., Disp: 20 tablet, Rfl: 0  ???  norethindrone-ethinyl estradiol (JUNEL 1/20, 21,) 1-20 mg-mcg per tablet, Take 1 tablet by mouth daily., Disp: 84 tablet, Rfl: 1  ???  pen needle, diabetic (BD ULTRA-FINE NANO PEN NEEDLE) 32 gauge x 5/32 (4 mm) Ndle, ok to sub any brand or size needle preferred by insurance/patient, use 4x/day, dx E10.65, Disp: 100 each, Rfl: 12  ???  pen needle, diabetic (BD ULTRA-FINE NANO PEN NEEDLE) 32 gauge x 5/32 Ndle, To administer insulin 6-8x/day, Disp: 200 each, Rfl: 3  ???  predniSONE (DELTASONE) 50 MG tablet, Take 50 mg by mouth in the morning., Disp: , Rfl:   ???  syringe with needle 1 mL 28 gauge x 1/2 Syrg, Use every 7 (seven) days., Disp: , Rfl:   ???  tacrolimus (PROGRAF) 1 MG capsule, Take 1 capsule (1 mg total) by mouth two (2) times a day., Disp: 60 capsule, Rfl: 5  ???  XOLAIR 150 mg/mL syringe, , Disp: , Rfl:     Allergies   Allergen Reactions   ???  Amoxicillin-Pot Clavulanate Rash   ??? Grass Pollen Hives   ??? Lemon Hives   ??? Malt Extract Hives   ??? Metronidazole Hives   ??? Peanut Hives   ??? Tree And Shrub Pollen Hives   ??? Amoxicillin Rash   ??? Penicillins Rash       Family History   Problem Relation Age of Onset   ??? Ulcerative colitis Mother    ??? Asthma Maternal Grandfather    ??? Hypertension Maternal Grandfather    ??? Cataracts Neg Hx    ??? Glaucoma Neg Hx        Objective:        There were no vitals taken for this visit.    Lab Review    Lab Results   Component Value Date    NA 135 02/05/2021    K 4.4 02/05/2021    CL 104 02/05/2021    CO2 25.0 02/05/2021    BUN 7 (L) 02/05/2021    CREATININE 0.73 02/05/2021    GFRNONAA >=60 03/29/2018    GFRAA >=60 03/29/2018    CALCIUM 9.5 02/05/2021    ALBUMIN 3.4 02/05/2021    PHOS 4.1 05/17/2020       Lab Results   Component Value Date    CHOL 147 02/05/2021     Lab Results   Component Value Date    LDL 69 02/05/2021     Lab Results   Component Value Date    HDL 54 02/05/2021     Lab Results   Component Value Date    TRIG 118 02/05/2021     Lab Results   Component Value Date    ALT 21 02/05/2021     Lab Results   Component Value Date    TSH 0.518 (L) 02/05/2021     Lab Results   Component Value Date    CREATUR 69.0 02/05/2021     Lab Results   Component Value Date    Albumin Quantitative, Urine <0.3 02/05/2021    Albumin/Creatinine Ratio  02/05/2021      Comment:      Unable to calculate due to value below lower limit of assay linearity.     No results found for: Christa See, MSHCGMOM, MALBCRERAT  No results found for: PROTEINUR, LABCREA, PCRATIOUR        Alexia Freestone, MSN, AGCNS-BC, APRN, CDCES

## 2022-01-21 ENCOUNTER — Institutional Professional Consult (permissible substitution): Admit: 2022-01-21 | Discharge: 2022-01-22 | Payer: BLUE CROSS/BLUE SHIELD

## 2022-01-21 DIAGNOSIS — E1065 Type 1 diabetes mellitus with hyperglycemia: Principal | ICD-10-CM

## 2022-01-21 NOTE — Unmapped (Signed)
Deexcom downloaded. POC  A1C done today.

## 2022-01-23 DIAGNOSIS — E1069 Type 1 diabetes mellitus with other specified complication: Principal | ICD-10-CM

## 2022-01-23 MED ORDER — NORETHINDRONE ACETATE 1 MG-ETHINYL ESTRADIOL 20 MCG TABLET
ORAL_TABLET | Freq: Every day | ORAL | 0 refills | 84 days | Status: CP
Start: 2022-01-23 — End: 2023-01-24
  Filled 2022-02-11: qty 63, 63d supply, fill #0

## 2022-01-28 NOTE — Unmapped (Signed)
[facility] Refill Coordination Note    Specialty Medication(s) to be Shipped:   Transplant: tacrolimus 1mg     Other medication(s) to be shipped: No additional medications requested for fill at this time     Sandra Underwood, DOB: 03-13-2001  Phone: [phone] (home)       All above HIPAA information was verified with patient.     Was a Nurse, learning disability used for this call? No    Completed refill call assessment today to schedule patient's medication shipment from the [facility] [phone]).  All relevant notes have been reviewed.     Specialty medication(s) and dose(s) confirmed: Regimen is correct and unchanged.   Changes to medications: Sharilynn reports no changes at this time.  Changes to insurance: No  New side effects reported not previously addressed with a pharmacist or physician: None reported  Questions for the pharmacist: No    Confirmed patient received a Conservation officer, historic buildings and a Surveyor, mining with first shipment. The patient will receive a drug information handout for each medication shipped and additional FDA Medication Guides as required.       DISEASE/MEDICATION-SPECIFIC INFORMATION        N/A    SPECIALTY MEDICATION ADHERENCE     Medication Adherence    Patient reported X missed doses in the last month: 0  Specialty Medication: Tacrolimus 1mg  Caps  Patient is on additional specialty medications: No  Patient is on more than two specialty medications: No  Informant: patient  Reliability of informant: reliable  Reasons for non-adherence: no problems identified  Confirmed plan for next specialty medication refill: delivery by pharmacy  Refills needed for supportive medications: not needed              Were doses missed due to medication being on hold? No    Tacrolimus 1 mg: 14 days of medicine on hand        REFERRAL TO PHARMACIST     Referral to the pharmacist: Not needed      [facility]     Shipping address confirmed in Epic.     Delivery Scheduled: Yes, Expected medication delivery date: 02/05/22.     Medication will be delivered via Next Day Courier to the prescription address in Epic WAM.    Nancy Nordmann [facility] Specialty Technician

## 2022-02-04 MED FILL — HUMALOG KWIKPEN (U-100) INSULIN 100 UNIT/ML SUBCUTANEOUS: 50 days supply | Qty: 15 | Fill #1

## 2022-02-04 MED FILL — TACROLIMUS 1 MG CAPSULE, IMMEDIATE-RELEASE: ORAL | 30 days supply | Qty: 60 | Fill #2

## 2022-02-20 NOTE — Unmapped (Unsigned)
[facility] - T1DM Return Visit    Assessment and Plan:  Sandra Underwood is a 21 y.o. female with PMHx of T1DM and chronic urticaria on tacrolimus who is being seen in follow-up for T1DM.    1. Type 1 diabetes  A1c: HbA1c ***%, previously 6.6% (10/2021). Goal HbA1c <7.0% without hypoglycemia.  Brief DM History: Diagnosed with T1DM at age 54, previously poorly controlled due to anxiety/depression, wanted to be like her peers and not deal with diabetes, but has done extremely well since starting pump therapy in 2021 and feels confident in her diabetes management and incorporation into her lifestyle.  Current Regimen: Tandem Pump with Control IQ (See Data Review below for pump settings.)  CGM Review: CGM downloaded and interpreted x 14 days. Daily trends reviewed including patterns. Patterns include: MBG ***, SD ***, ***% TIR, ***% low, ***% high, ***% time active. ***  Today: ***  -She has backup pens of both long- and short-acting insulin available and feels comfortable with pump backup plan.    *The patient is currently using the therapeutic CGM as prescribed.  *The patient has diabetes mellitus.  *The patient is insulin-treated with 2 or more daily injections of insulin(MDII).   *The patient has been using a home blood glucose monitor (BGM) and has performed tests 4 times per day or more prior to starting use of a CGM.   *The patient???s insulin treatment regimen requires frequent adjustments by the patient on the basis of BGM and/or therapeutic CGM testing results.   *Within the last six months, I had an in-person visit with the beneficiary to evaluate their diabetes control and determine that the above criteria are met.   *Every six months I will continue to have an in-person visit with the beneficiary to assess adherence to their CGM regimen and diabetes treatment plan.    2. Diabetes-related complications and prevention  - Eyes: Last eye exam 04/19/2021 with [facility]. No disease. Due 04/2021.  - Kidneys: Last urine MA/C negative (02/2021) and GFR >90 (02/2021). Due 02/2022.  - Feet: Jannine reports performed at last visit with Dr. Alphia Kava. Due 04/2022.  - BP: WNL  - Lipids/ASCVD risk: Last lipids with LDL 69, HDL 54, TChol 147, TG 118 (02/2021).  - Contraception: OCPs    No follow-ups on file.    Patient was seen and discussed with Dr. Yetta Barre.    Rosiland Oz, MD, PhD  PGY-3, Division of Endocrinology  Phone: [phone]  Fax: [phone]    Subjective:  Sandra Underwood is a 21 y.o. female with PMHx of T1DM and chronic urticaria on tacrolimus who is being seen in follow-up for T1DM.    Initial history obtained by Patric Dykes on 03/01/2018: Ms. Spurr is a senior in [employer] taking college courses at her local community college in anticipation of a attending [employer] in the Fall of 2019 for business and Social research officer, government. She is interested in becoming a Best boy.  ??  The patient was initially diagnosed with Type 1 diabetes mellitus in November 2014.   History of hospitalizations for DKA: yes x3 in 2018, 2/2 UTI x2 and food poisoning x1.  FH of autoimmune dysfunction: yes - Mom has ulcerative colitis.  Hx of auto-immune co-morbidities: No.   ??  Current symptoms/problems: none  Known diabetic complications: none  Current diabetic medications: Lantus pen 32u @ HS without misses doses, Novolog 1u:8g carbs and 1u:50points >150. Taking Novolog 1-2x/day.   ??  Previous medication trials: metformin  for 1 day at diagnosis.  ??  Eye exam current (within one year): yes - Dr. Dede Query, [facility] in 07/2017. No hx of DR. No vision change. 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   ??  Menstrual cycle: Menarche@ 21yo, OBCP @21yo , taking Junel 21d w/o placebo week per GYN. Has menses once every 6-9 months.   ??  Current monitoring regimen: Freestyle Libre with receiver on her phone  Blood glucose meter to visit: phone  Download: Not able to download from phone at this time. Phone reviewed manually. Checking 1-2x/day. FBG 111-160mg /dl.   ??  Any episodes of severe hypoglycemia (requiring the assistance of others)? no  Hypoglycemic awareness: 70mg /dl  ??  ACE inhibitor or angiotensin II receptor blocker? no  Statin? no  ASA? no  Weight trend: stable  Current diet: high carb 30g -B, 90g - L, 150g - D.   Exercise: no. Barriers to exercise: none    TODAY (02/20/22): ***MDI vs pump? Omnipod transition?    ROS  All other systems were reviewed and are negative except as noted above.    Past Medical History:   Diagnosis Date   ??? Allergic conjunctivitis    ??? Allergic rhinitis    ??? Arthritis     ankles and elbows   ??? DKA (diabetic ketoacidoses)    ??? DM (diabetes mellitus), type 1 (CMS-HCC)    ??? Food allergy     lemon   ??? Type 1 Duane's syndrome      Past Surgical History:   Procedure Laterality Date   ??? TONSILLECTOMY AND ADENOIDECTOMY  10/2015     Family History   Problem Relation Age of Onset   ??? Ulcerative colitis Mother    ??? Asthma Maternal Grandfather    ??? Hypertension Maternal Grandfather    ??? Cataracts Neg Hx    ??? Glaucoma Neg Hx      Social History     Socioeconomic History   ??? Marital status: Single   Tobacco Use   ??? Smoking status: Never   ??? Smokeless tobacco: Never   ??? Tobacco comments:     once every 2 months   Vaping Use   ??? Vaping Use: Never used   Substance and Sexual Activity   ??? Alcohol use: Yes     Comment: hard liquor once a month- social   ??? Drug use: Not Currently   ??? Sexual activity: Yes     Partners: Male     Birth control/protection: OCP, Condom     Allergies   Allergen Reactions   ??? Amoxicillin-Pot Clavulanate Rash   ??? Grass Pollen Hives   ??? Lemon Hives   ??? Malt Extract Hives   ??? Metronidazole Hives   ??? Peanut Hives   ??? Tree And Shrub Pollen Hives   ??? Amoxicillin Rash   ??? Penicillins Rash     Current Outpatient Medications   Medication Instructions   ??? acetaminophen (TYLENOL) 1,000 mg, Oral, Every 8 hours PRN   ??? blood sugar diagnostic Strp Use to check blood sugar four (4) times a day (before meals and nightly).   ??? blood-glucose sensor (DEXCOM G6 SENSOR) Devi 1 each, Miscellaneous, Every 10 days   ??? blood-glucose transmitter (DEXCOM G6 TRANSMITTER) Devi 1 each, Miscellaneous, Every 3 months   ??? cetirizine (ZYRTEC) 10 MG tablet Take 1-2 tablets (10-20 mg total) by mouth two (2) times a day for hives.   ??? cetirizine (ZYRTEC) 20 mg, Oral, 2  times a day PRN   ??? famotidine (PEPCID AC) 20 mg, Oral, 2 times a day (standard)   ??? famotidine (PEPCID) 10 mg, Oral, 2 times a day (standard)   ??? hydrOXYzine (ATARAX) 25 MG tablet Take 1 tablet by mouth every eight hours as needed for itching   ??? insulin admin supplies (INPEN, FOR NOVOLOG OR FIASP,) InPn injection pen Dispense 1 InPen   ??? insulin glargine (LANTUS SOLOSTAR U-100 INSULIN) 100 unit/mL (3 mL) injection pen Use up to 50 units per day as per MD instructions, backup for pump failure.   ??? insulin lispro (HUMALOG KWIKPEN INSULIN) 100 unit/mL injection pen Use up to 30 units per day, as per MD instructions   ??? insulin lispro (HUMALOG U-100 INSULIN) 100 unit/mL injection Use up to 100 units/day, divided into 3 times daily before meals as per MD instructions   ??? lancets (ONETOUCH DELICA LANCETS) 33 gauge Misc Use to check bloof sugar 6 times daily   ??? levocetirizine (XYZAL) 5 MG tablet Take 2 tablets by mouth twice a day for urticaria   ??? levothyroxine (SYNTHROID) 25 mcg, Oral, Daily (standard)   ??? magnesium 30 mg, Oral, 2 times a day (standard)   ??? montelukast (SINGULAIR) 10 mg, Oral, Nightly   ??? norethindrone-ethinyl estradiol (JUNEL 1/20, 21,) 1-20 mg-mcg per tablet 1 tablet, Oral, Daily (standard)   ??? pen needle, diabetic (BD ULTRA-FINE NANO PEN NEEDLE) 32 gauge x 5/32 (4 mm) Ndle ok to sub any brand or size needle preferred by insurance/patient, use 4x/day, dx E10.65   ??? pen needle, diabetic (BD ULTRA-FINE NANO PEN NEEDLE) 32 gauge x 5/32 Ndle To administer insulin 6-8x/day   ??? predniSONE (DELTASONE) 50 mg, Oral, Daily   ??? syringe with needle 1 mL 28 gauge x 1/2 Syrg Use every 7 (seven) days.   ??? tacrolimus (PROGRAF) 1 mg, Oral, 2 times a day   ??? XOLAIR 150 mg/mL syringe No dose, route, or frequency recorded.       Objective:  There were no vitals filed for this visit.  Wt Readings from Last 6 Encounters:   10/23/21 72.5 kg (159 lb 12.8 oz)   07/05/21 79.3 kg (174 lb 12.8 oz)   04/03/21 80.2 kg (176 lb 12.8 oz)   04/03/21 80.1 kg (176 lb 9.6 oz)   02/05/21 80.3 kg (177 lb)   12/25/20 77.1 kg (170 lb)     Physical Exam  Data Review: I personally reviewed all available, pertinent labs, imaging, and outside records.    Pump Settings:      CGM Data:      Portions of this record may have been created using Scientist, clinical (histocompatibility and immunogenetics). Dictation errors have been sought but may not have all been identified and corrected.

## 2022-02-21 ENCOUNTER — Ambulatory Visit
Admit: 2022-02-21 | Payer: BLUE CROSS/BLUE SHIELD | Attending: Student in an Organized Health Care Education/Training Program | Primary: Student in an Organized Health Care Education/Training Program

## 2022-02-25 NOTE — Unmapped (Signed)
[facility] Refill Coordination Note    Specialty Medication(s) to be Shipped:   Inflammatory Disorders: Tacrolimus 1mg  Caps  Other medication(s) to be shipped: Cetirizine 10mg , Famotidine 20mg , Humalog Kwikpen, Lantus Solostar     Deetta Siegmann, [place]: Jan 14, 2001  Phone: [phone] (home)     All above HIPAA information was verified with patient.     Was a Nurse, learning disability used for this call? No    Completed refill call assessment today to schedule patient's medication shipment from the [facility] [phone]).  All relevant notes have been reviewed.     Specialty medication(s) and dose(s) confirmed: Regimen is correct and unchanged.   Changes to medications: Bernardette reports no changes at this time.  Changes to insurance: No  New side effects reported not previously addressed with a pharmacist or physician: None reported  Questions for the pharmacist: No    Confirmed patient received a Conservation officer, historic buildings and a Surveyor, mining with first shipment. The patient will receive a drug information handout for each medication shipped and additional FDA Medication Guides as required.       DISEASE/MEDICATION-SPECIFIC INFORMATION        N/A    SPECIALTY MEDICATION ADHERENCE     Medication Adherence    Patient reported X missed doses in the last month: 0  Specialty Medication: Tacrolimus 1mg  Caps  Patient is on additional specialty medications: No  Patient is on more than two specialty medications: No  Informant: patient  Reliability of informant: reliable  Reasons for non-adherence: no problems identified        Were doses missed due to medication being on hold? No    Tacrolimus 1mg  : 9 days of medicine on hand     REFERRAL TO PHARMACIST     Referral to the pharmacist: Not needed    [facility]     Shipping address confirmed in Epic.     Delivery Scheduled: Yes, Expected medication delivery date: 02/27/2022.     Medication will be delivered via Same Day Courier to the prescription address in Epic WAM.    Dayyan Krist P Allena Katz   [facility] Specialty Technician

## 2022-02-27 MED FILL — TACROLIMUS 1 MG CAPSULE, IMMEDIATE-RELEASE: ORAL | 30 days supply | Qty: 60 | Fill #3

## 2022-02-27 MED FILL — FAMOTIDINE 20 MG TABLET: ORAL | 30 days supply | Qty: 60 | Fill #1

## 2022-02-27 MED FILL — CETIRIZINE 10 MG TABLET: ORAL | 30 days supply | Qty: 120 | Fill #1

## 2022-02-27 MED FILL — LANTUS SOLOSTAR U-100 INSULIN 100 UNIT/ML (3 ML) SUBCUTANEOUS PEN: 90 days supply | Qty: 45 | Fill #1

## 2022-03-12 ENCOUNTER — Ambulatory Visit
Admit: 2022-03-12 | Discharge: 2022-03-13 | Payer: BLUE CROSS/BLUE SHIELD | Attending: Student in an Organized Health Care Education/Training Program | Primary: Student in an Organized Health Care Education/Training Program

## 2022-03-12 DIAGNOSIS — E1065 Type 1 diabetes mellitus with hyperglycemia: Principal | ICD-10-CM

## 2022-03-12 LAB — ALBUMIN / CREATININE URINE RATIO
ALBUMIN QUANT URINE: 1.1 mg/dL
ALBUMIN/CREATININE RATIO: 3.7 ug/mg (ref 0.0–30.0)
CREATININE, URINE: 300.6 mg/dL

## 2022-03-12 MED ORDER — INSULIN LISPRO (U-100) 100 UNIT/ML SUBCUTANEOUS PEN
12 refills | 0 days | Status: CP
Start: 2022-03-12 — End: ?
  Filled 2022-03-26: qty 15, 30d supply, fill #0

## 2022-03-12 NOTE — Unmapped (Signed)
[facility] - T1DM Return Visit    Assessment and Plan:  Sandra Underwood is a 21 y.o. female with PMHx of T1DM and chronic urticaria on tacrolimus who is being seen in follow-up for T1DM.    1. Type 1 diabetes  A1c: HbA1c 6.5%, previously 6.6% (10/2021). Goal HbA1c <7.0% without hypoglycemia.  Brief DM History: Diagnosed with T1DM at age 74, previously poorly controlled due to anxiety/depression, wanted to be like her peers and not deal with diabetes, but has done extremely well since starting pump therapy in 2021 and feels confident in her diabetes management and incorporation into her lifestyle.  Current Regimen: Lantus 36 units nightly, 1:8 ICR, 1:50>150 ACHS (off pump therapy for the summer but expects to resume  CGM Review: CGM downloaded and interpreted x 14 days. Daily trends reviewed including patterns. Patterns include: MBG 190, SD 84, 41% TIR, 6% low, 53% high, 94% time active. Lots of variability with post-prandial hyperglycemia followed by hypoglycemia, primarily in the evenings/overnight.  Today: [name] prefers to switch from pump therapy to MDI in the summer when she is going to be at the beach/pool, wearing a bathing suit, etc. She has been on MDI for the past couple of months after going to a music festival and plans to continue MDI until July or August and then will resume pump therapy at that time. The last month has been especially challenging, as she was in a relationship that was not very healthy for her, but this is now over. On CGM review, she has post-prandial hyperglycemia followed by hypoglycemia, primarily in the evenings/overnight. Based on her report of typically bolusing during her meal, I think is likely secondary to too intense of an ICR at dinnertime on top of a background of a slightly too high basal rate based on some evenings where she downtrends overnight without a preceding correction. We will make slight adjustments as below and have her check in with CDE before next visit to assess these changes so that we can try to optimize her MDI regimen if she is going to consistently be doing MDI in the summertimes.  Patient Instructions   Decrease Lantus to 32 units nightly.  Adjust carb ratio to 1 unit for every 10 carbs with dinner.  If bolusing for food 30-60 minutes after your meal, give half of your total dose (to cover food and correction).  [facility]: [phone]    *The patient is currently using the therapeutic CGM as prescribed.  *The patient has diabetes mellitus.  *The patient is insulin-treated with 2 or more daily injections of insulin(MDII).   *The patient has been using a home blood glucose monitor (BGM) and has performed tests 4 times per day or more prior to starting use of a CGM.   *The patient???s insulin treatment regimen requires frequent adjustments by the patient on the basis of BGM and/or therapeutic CGM testing results.   *Within the last six months, I had an in-person visit with the beneficiary to evaluate their diabetes control and determine that the above criteria are met.   *Every six months I will continue to have an in-person visit with the beneficiary to assess adherence to their CGM regimen and diabetes treatment plan.    2. Diabetes-related complications and prevention  - Eyes: Last eye exam 04/19/2021 with [facility]. No disease. Due 04/2021. Provided phone number to schedule follow up today.  - Kidneys: Last urine MA/C negative (02/2022) and GFR >90 (02/2021). Due 02/2022. Send Cr with next lab  draw.  - Feet: Tata reports performed at last visit with Dr. Alphia Kava. Due 04/2022.  - BP: WNL  - Lipids/ASCVD risk: Last lipids with LDL 69, HDL 54, TChol 147, TG 118 (02/2021).  - Contraception: OCPs    Return in about 3 months (around 06/12/2022) for with Alexia Freestone in 2 months and then with me in 4 months.    Patient was seen and discussed with Dr. Concepcion Elk.    Rosiland Oz, MD, PhD  PGY-3, Division of Endocrinology  Phone: [phone]  Fax: [phone]    Subjective:  Sandra Underwood is a 21 y.o. female with PMHx of T1DM and chronic urticaria on tacrolimus who is being seen in follow-up for T1DM.    Initial history obtained by Patric Dykes on 03/01/2018: Sandra Underwood is a senior in [employer] taking college courses at her local community college in anticipation of a attending [employer] in the Fall of 2019 for business and Social research officer, government. She is interested in becoming a Best boy.     The patient was initially diagnosed with Type 1 diabetes mellitus in November 2014.   History of hospitalizations for DKA: yes x3 in 2018, 2/2 UTI x2 and food poisoning x1.  FH of autoimmune dysfunction: yes - Mom has ulcerative colitis.  Hx of auto-immune co-morbidities: No.      Current symptoms/problems: none  Known diabetic complications: none  Current diabetic medications: Lantus pen 32u @ HS without misses doses, Novolog 1u:8g carbs and 1u:50points >150. Taking Novolog 1-2x/day.      Previous medication trials: metformin for 1 day at diagnosis.     Eye exam current (within one year): yes - Dr. Dede Query, [facility] in 07/2017. No hx of DR. No vision change. 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      Menstrual cycle: Menarche@ 21yo, OBCP @21yo , taking Junel 21d w/o placebo week per GYN. Has menses once every 6-9 months.      Current monitoring regimen: Freestyle Libre with receiver on her phone  Blood glucose meter to visit: phone  Download: Not able to download from phone at this time. Phone reviewed manually. Checking 1-2x/day. FBG 111-160mg /dl.      Any episodes of severe hypoglycemia (requiring the assistance of others)? no  Hypoglycemic awareness: 70mg /dl     ACE inhibitor or angiotensin II receptor blocker? no  Statin? no  ASA? no  Weight trend: stable  Current diet: high carb 30g -B, 90g - L, 150g - D.   Exercise: no. Barriers to exercise: none    TODAY (03/12/22): She took her pump off in March for a music festival because it was going to be hot outside and has been on MDI since. She met with Liborio Nixon about Omnipod, only concerns are about frequency of pod changes. She'd like to stick with Tandem for now. She finds it harder to remember to bolus when on MDI than with her pump, often boluses mid-meal. She does not correct more frequently than every 4 hours.    ROS  All other systems were reviewed and are negative except as noted above.    Past Medical History:   Diagnosis Date    Allergic conjunctivitis     Allergic rhinitis     Arthritis     ankles and elbows    DKA (diabetic ketoacidoses)     DM (diabetes mellitus), type 1 (CMS-HCC)     Food allergy     lemon    Type  1 Duane's syndrome      Past Surgical History:   Procedure Laterality Date    TONSILLECTOMY AND ADENOIDECTOMY  10/2015     Family History   Problem Relation Age of Onset    Ulcerative colitis Mother     Asthma Maternal Grandfather     Hypertension Maternal Grandfather     Cataracts Neg Hx     Glaucoma Neg Hx      Social History     Socioeconomic History    Marital status: Single   Tobacco Use    Smoking status: Never    Smokeless tobacco: Never    Tobacco comments:     once every 2 months   Vaping Use    Vaping Use: Never used   Substance and Sexual Activity    Alcohol use: Yes     Comment: hard liquor once a month- social    Drug use: Not Currently    Sexual activity: Yes     Partners: Male     Birth control/protection: OCP, Condom     Allergies   Allergen Reactions    Amoxicillin-Pot Clavulanate Rash    Grass Pollen Hives    Lemon Hives    Malt Extract Hives    Metronidazole Hives    Peanut Hives    Tree And Shrub Pollen Hives    Amoxicillin Rash    Penicillins Rash     Current Outpatient Medications   Medication Instructions    acetaminophen (TYLENOL) 1,000 mg, Oral, Every 8 hours PRN    blood sugar diagnostic Strp Use to check blood sugar four (4) times a day (before meals and nightly).    blood-glucose sensor (DEXCOM G6 SENSOR) Devi 1 each, Miscellaneous, Every 10 days    blood-glucose transmitter (DEXCOM G6 TRANSMITTER) Devi 1 each, Miscellaneous, Every 3 months    cetirizine (ZYRTEC) 10 MG tablet Take 1-2 tablets (10-20 mg total) by mouth two (2) times a day for hives.    cetirizine (ZYRTEC) 20 mg, Oral, 2 times a day PRN    famotidine (PEPCID AC) 20 mg, Oral, 2 times a day (standard)    famotidine (PEPCID) 10 mg, Oral, 2 times a day (standard)    hydrOXYzine (ATARAX) 25 MG tablet Take 1 tablet by mouth every eight hours as needed for itching    insulin admin supplies (INPEN, FOR NOVOLOG OR FIASP,) InPn injection pen Dispense 1 InPen    insulin glargine (LANTUS SOLOSTAR U-100 INSULIN) 100 unit/mL (3 mL) injection pen Use up to 50 units per day as per MD instructions, backup for pump failure.    insulin lispro (HUMALOG KWIKPEN INSULIN) 100 unit/mL injection pen Use up to 50 units per day, as per MD instructions.    insulin lispro (HUMALOG U-100 INSULIN) 100 unit/mL injection Use up to 100 units/day, divided into 3 times daily before meals as per MD instructions    lancets (ONETOUCH DELICA LANCETS) 33 gauge Misc Use to check bloof sugar 6 times daily    levocetirizine (XYZAL) 5 MG tablet Take 2 tablets by mouth twice a day for urticaria    levothyroxine (SYNTHROID) 25 mcg, Oral, Daily (standard)    magnesium 30 mg, Oral, 2 times a day (standard)    montelukast (SINGULAIR) 10 mg, Oral, Nightly    norethindrone-ethinyl estradiol (JUNEL 1/20, 21,) 1-20 mg-mcg per tablet 1 tablet, Oral, Daily (standard)    pen needle, diabetic (BD ULTRA-FINE NANO PEN NEEDLE) 32 gauge x 5/32 (4 mm) Ndle ok to sub any  brand or size needle preferred by insurance/patient, use 4x/day, dx E10.65    pen needle, diabetic (BD ULTRA-FINE NANO PEN NEEDLE) 32 gauge x 5/32 Ndle To administer insulin 6-8x/day    predniSONE (DELTASONE) 50 mg, Oral, Daily    syringe with needle 1 mL 28 gauge x 1/2 Syrg Use every 7 (seven) days.    tacrolimus (PROGRAF) 1 mg, Oral, 2 times a day    XOLAIR 150 mg/mL syringe No dose, route, or frequency recorded.       Objective:  Vitals:    03/12/22 1324   BP: 102/65   Pulse: 107     Wt Readings from Last 6 Encounters:   03/12/22 71.4 kg (157 lb 6.4 oz)   10/23/21 72.5 kg (159 lb 12.8 oz)   07/05/21 79.3 kg (174 lb 12.8 oz)   04/03/21 80.2 kg (176 lb 12.8 oz)   04/03/21 80.1 kg (176 lb 9.6 oz)   02/05/21 80.3 kg (177 lb)     Physical Exam  Vitals reviewed.   Constitutional:       General: She is not in acute distress.     Appearance: She is normal weight. She is not ill-appearing.   Cardiovascular:      Rate and Rhythm: Normal rate and regular rhythm.   Pulmonary:      Effort: Pulmonary effort is normal. No respiratory distress.   Musculoskeletal:      Right lower leg: No edema.      Left lower leg: No edema.   Skin:     General: Skin is warm and dry.     Data Review: I personally reviewed all available, pertinent labs, imaging, and outside records.    Pump Settings:  N/A    CGM Data:      Portions of this record may have been created using Scientist, clinical (histocompatibility and immunogenetics). Dictation errors have been sought but may not have all been identified and corrected.

## 2022-03-12 NOTE — Unmapped (Signed)
Dexcom Meter and pump downloaded. POC glucose  done today. Patient is fasting . 101 mg/dL.

## 2022-03-12 NOTE — Unmapped (Addendum)
Decrease Lantus to 32 units nightly.  Adjust carb ratio to 1 unit for every 10 carbs with dinner.  If bolusing for food 30-60 minutes after your meal, give half of your total dose (to cover food and correction).  [facility]: [phone]

## 2022-03-13 NOTE — Unmapped (Signed)
I saw and evaluated the patient, participating in the key portions of the service.  I reviewed the fellow's note.  I agree with the fellow's findings and plan. Nayelly Laughman G Rodriguez, MD

## 2022-03-21 DIAGNOSIS — E1065 Type 1 diabetes mellitus with hyperglycemia: Principal | ICD-10-CM

## 2022-03-21 MED ORDER — PEN NEEDLE, DIABETIC 32 GAUGE X 5/32" (4 MM)
12 refills | 0 days | Status: CP
Start: 2022-03-21 — End: ?
  Filled 2022-03-26: qty 100, 25d supply, fill #0

## 2022-03-21 NOTE — Unmapped (Signed)
[facility] Refill Coordination Note    Specialty Medication(s) to be Shipped:   CF/Pulmonary/Asthma: Tacrolimus 1mg  Caps  Other medication(s) to be shipped: Cetirizine 10mg , Famotidine 20mg , Humalog Kwikpen, Levothyroxine , Pen needles     Sandra Underwood, DOB: 29-Feb-2001  Phone: [phone] (home)     All above HIPAA information was verified with patient.     Was a Nurse, learning disability used for this call? No    Completed refill call assessment today to schedule patient's medication shipment from the [facility] [phone]).  All relevant notes have been reviewed.     Specialty medication(s) and dose(s) confirmed: Regimen is correct and unchanged.   Changes to medications: Sierra reports no changes at this time.  Changes to insurance: No  New side effects reported not previously addressed with a pharmacist or physician: None reported  Questions for the pharmacist: No    Confirmed patient received a Conservation officer, historic buildings and a Surveyor, mining with first shipment. The patient will receive a drug information handout for each medication shipped and additional FDA Medication Guides as required.       DISEASE/MEDICATION-SPECIFIC INFORMATION        N/A    SPECIALTY MEDICATION ADHERENCE     Medication Adherence    Patient reported X missed doses in the last month: 0  Specialty Medication: Tacrolimus 1mg  Cap  Patient is on additional specialty medications: No  Patient is on more than two specialty medications: No  Informant: patient  Reliability of informant: reliable  Reasons for non-adherence: no problems identified        Were doses missed due to medication being on hold? No    Tacrolimus 1mg  : 7 days of medicine on hand     REFERRAL TO PHARMACIST     Referral to the pharmacist: Not needed    [facility]     Shipping address confirmed in Epic.     Delivery Scheduled: Yes, Expected medication delivery date: 03/26/2022.     Medication will be delivered via Same Day Courier to the prescription address in Epic WAM.    Roman Dubuc P Allena Katz   [facility] Specialty Technician

## 2022-03-26 MED FILL — LEVOTHYROXINE 25 MCG TABLET: ORAL | 90 days supply | Qty: 90 | Fill #2

## 2022-03-26 MED FILL — CETIRIZINE 10 MG TABLET: ORAL | 30 days supply | Qty: 120 | Fill #2

## 2022-03-26 MED FILL — TACROLIMUS 1 MG CAPSULE, IMMEDIATE-RELEASE: ORAL | 30 days supply | Qty: 60 | Fill #4

## 2022-03-26 MED FILL — FAMOTIDINE 20 MG TABLET: ORAL | 30 days supply | Qty: 60 | Fill #2

## 2022-04-11 NOTE — Unmapped (Signed)
[place] [facility]ations [facility] ([facility]) [facility]  Network Health Plan (NHP) Maintenance      DM lab timeline targets updated per program requirements.   Labs are up to date.        Evann Erazo, CPhT   Certified Pharmacy Technician    Assessment of Medications Program (CAMP)   P [phone], F [phone]

## 2022-04-17 DIAGNOSIS — E1069 Type 1 diabetes mellitus with other specified complication: Principal | ICD-10-CM

## 2022-04-17 MED ORDER — NORETHINDRONE ACETATE 1 MG-ETHINYL ESTRADIOL 20 MCG TABLET
ORAL_TABLET | Freq: Every day | ORAL | 0 refills | 84.00000 days | Status: CP
Start: 2022-04-17 — End: 2023-04-18
  Filled 2022-04-17: qty 21, 21d supply, fill #0

## 2022-04-17 NOTE — Unmapped (Signed)
[facility] Clinical Assessment & Refill Coordination Note    Sandra Underwood, Sandra Underwood: [birth year]  Phone: [phone] (home)     All above HIPAA information was verified with patient.     Was a Nurse, learning disability used for this call? No    Specialty Medication(s):   Inflammatory Disorders: tacrolimus     Current Outpatient Medications   Medication Sig Dispense Refill    acetaminophen (TYLENOL) 500 MG tablet Take 2 tablets (1,000 mg total) by mouth every eight (8) hours as needed for pain. 30 tablet 0    blood sugar diagnostic Strp Use to check blood sugar four (4) times a day (before meals and nightly). 300 each 12    blood-glucose sensor (DEXCOM G6 SENSOR) Devi 1 each by Miscellaneous route every ten (10) days. 9 each 2    blood-glucose transmitter (DEXCOM G6 TRANSMITTER) Devi 1 each by Miscellaneous route Every three (3) months. 1 each 3    cetirizine (ZYRTEC) 10 MG tablet Take 2 tablets (20 mg total) by mouth two (2) times a day as needed for allergies.      cetirizine (ZYRTEC) 10 MG tablet Take 1-2 tablets (10-20 mg total) by mouth two (2) times a day for hives. 120 tablet 5    famotidine (PEPCID AC) 20 MG tablet Take 1 tablet (20 mg total) by mouth Two (2) times a day. 60 tablet 5    famotidine (PEPCID) 10 MG tablet Take 1 tablet (10 mg total) by mouth Two (2) times a day.      insulin admin supplies (INPEN, FOR NOVOLOG OR FIASP,) InPn injection pen Dispense 1 InPen 1 each 0    insulin glargine (LANTUS SOLOSTAR U-100 INSULIN) 100 unit/mL (3 mL) injection pen Use up to 50 units per day as per MD instructions, backup for pump failure. 45 mL 12    insulin lispro (HUMALOG KWIKPEN INSULIN) 100 unit/mL injection pen Use up to 50 units per day, as per MD instructions. 15 mL 12    insulin lispro (HUMALOG U-100 INSULIN) 100 unit/mL injection Use up to 100 units/day, divided into 3 times daily before meals as per MD instructions 90 mL 12    lancets (ONETOUCH DELICA LANCETS) 33 gauge Misc Use to check bloof sugar 6 times daily 200 each 3    levocetirizine (XYZAL) 5 MG tablet Take 2 tablets by mouth twice a day for urticaria 120 tablet 5    levothyroxine (SYNTHROID) 25 MCG tablet Take 1 tablet (25 mcg total) by mouth daily. 90 tablet 3    magnesium 30 mg tablet Take 1 tablet (30 mg total) by mouth Two (2) times a day.      montelukast (SINGULAIR) 10 mg tablet Take 1 tablet (10 mg total) by mouth nightly for 20 days. 20 tablet 0    norethindrone-ethinyl estradiol (JUNEL 1/20, 21,) 1-20 mg-mcg per tablet Take 1 tablet by mouth daily. 84 tablet 0    pen needle, diabetic (BD ULTRA-FINE NANO PEN NEEDLE) 32 gauge x 5/32 (4 mm) Ndle Use as directed 4 times daily. 100 each 12    pen needle, diabetic (BD ULTRA-FINE NANO PEN NEEDLE) 32 gauge x 5/32 Ndle To administer insulin 6-8x/day 200 each 3    predniSONE (DELTASONE) 50 MG tablet Take 1 tablet (50 mg total) by mouth in the morning.      syringe with needle 1 mL 28 gauge x 1/2 Syrg Use every 7 (seven) days.      tacrolimus (PROGRAF) 1 MG  capsule Take 1 capsule (1 mg total) by mouth two (2) times a day. 60 capsule 5    XOLAIR 150 mg/mL syringe        No current facility-administered medications for this visit.        Changes to medications: Sandra reports no changes at this time.    Allergies   Allergen Reactions    Amoxicillin-Pot Clavulanate Rash    Grass Pollen Hives    Lemon Hives    Malt Extract Hives    Metronidazole Hives    Peanut Hives    Tree And Shrub Pollen Hives    Amoxicillin Rash    Penicillins Rash       Changes to allergies: No    SPECIALTY MEDICATION ADHERENCE     Tacrolimus 1 mg: 21 days of medicine on hand     Medication Adherence    Patient reported X missed doses in the last month: 0  Specialty Medication: Tacrolimus 1 mg BID  Patient is on additional specialty medications: No  Patient is on more than two specialty medications: No  Any gaps in refill history greater than 2 weeks in the last 3 months: no  Demonstrates understanding of importance of adherence: yes  Informant: patient  Reliability of informant: reliable          Specialty medication(s) dose(s) confirmed: Regimen is correct and unchanged.     Are there any concerns with adherence? No    Adherence counseling provided? Not needed    CLINICAL MANAGEMENT AND INTERVENTION      Clinical Benefit Assessment:    Do you feel the medicine is effective or helping your condition? Yes    Clinical Benefit counseling provided? Not needed    Adverse Effects Assessment:    Are you experiencing any side effects? No    Are you experiencing difficulty administering your medicine? No    Quality of Life Assessment:    Quality of Life    Rheumatology  Oncology  Dermatology  Cystic Fibrosis          How many days over the past month did your urticaria  keep you from your normal activities? For example, brushing your teeth or getting up in the morning. 0 - Ms. Underwood denies experiencing any hives    Have you discussed this with your provider? Not needed    Acute Infection Status:    Acute infections noted within Epic:  No active infections  Patient reported infection: None    Therapy Appropriateness:    Is therapy appropriate and patient progressing towards therapeutic goals? Yes, therapy is appropriate and should be continued    DISEASE/MEDICATION-SPECIFIC INFORMATION      N/A    PATIENT SPECIFIC NEEDS     Does the patient have any physical, cognitive, or cultural barriers? No    Is the patient high risk? No    Does the patient require a Care Management Plan? No     SOCIAL DETERMINANTS OF HEALTH     At the [facility], we have learned that life circumstances - like trouble affording food, housing, utilities, or transportation can affect the health of many of our patients.   That is why we wanted to ask: are you currently experiencing any life circumstances that are negatively impacting your health and/or quality of life? No    Social Determinants of Psychologist, prison and probation services Strain: Not on file Internet Connectivity: Not on file   Food Insecurity: Not on  file   Tobacco Use: Low Risk     Smoking Tobacco Use: Never    Smokeless Tobacco Use: Never    Passive Exposure: Not on file   Housing/Utilities: Not on file   Alcohol Use: Not on file   Transportation Needs: Not on file   Substance Use: Not on file   Health Literacy: Not on file   Physical Activity: Not on file   Interpersonal Safety: Not on file   Stress: Not on file   Intimate Partner Violence: Not on file   Depression: Not on file   Social Connections: Not on file       Would you be willing to receive help with any of the needs that you have identified today? Not applicable       SHIPPING     Specialty Medication(s) to be Shipped:   Inflammatory Disorders: tacrolimus    Other medication(s) to be shipped: No additional medications requested for fill at this time     Changes to insurance: No    Delivery Scheduled: Yes, Expected medication delivery date: 04/28/22.     Medication will be delivered via Same Day Courier to the confirmed prescription address in [facility].    The patient will receive a drug information handout for each medication shipped and additional FDA Medication Guides as required.  Verified that patient has previously received a Conservation officer, historic buildings and a Surveyor, mining.    The patient or caregiver noted above participated in the development of this care plan and knows that they can request review of or adjustments to the care plan at any time.      All of the patient's questions and concerns have been addressed.    Oliva Bustard   [facility] Specialty Pharmacist

## 2022-04-17 NOTE — Unmapped (Signed)
Patient needs med refill for norethindrone-ethinyl estradiol (JUNEL 1/20, 21,) 1-20 mg-mcg per tablet   First available is not until 7/31. If possible, please refill meds in the mean time until patient's appointment.Thanks.

## 2022-04-17 NOTE — Unmapped (Signed)
Patient needs appointment, LV 04/03/21

## 2022-04-17 NOTE — Unmapped (Signed)
Patient is requesting the following refill  Requested Prescriptions     Pending Prescriptions Disp Refills    norethindrone-ethinyl estradiol (JUNEL 1/20, 21,) 1-20 mg-mcg per tablet 84 tablet 0     Sig: Take 1 tablet by mouth daily.       Recent Visits  No visits were found meeting these conditions.  Showing recent visits within past 365 days with a meds authorizing provider and meeting all other requirements  Future Appointments  Date Type Provider Dept   06/02/22 Appointment Amie Portland, FNP [facility] [address] [facility]   Showing future appointments within next 365 days with a meds authorizing provider and meeting all other requirements

## 2022-04-28 MED ORDER — PEN NEEDLE, DIABETIC 32 GAUGE X 5/32" (4 MM)
3 refills | 0 days | Status: CP
Start: 2022-04-28 — End: ?
  Filled 2022-04-28: qty 100, 25d supply, fill #1

## 2022-04-28 MED FILL — FAMOTIDINE 20 MG TABLET: ORAL | 30 days supply | Qty: 60 | Fill #3

## 2022-04-28 MED FILL — TACROLIMUS 1 MG CAPSULE, IMMEDIATE-RELEASE: ORAL | 30 days supply | Qty: 60 | Fill #5

## 2022-04-28 MED FILL — CETIRIZINE 10 MG TABLET: ORAL | 30 days supply | Qty: 120 | Fill #3

## 2022-04-28 MED FILL — HUMALOG KWIKPEN (U-100) INSULIN 100 UNIT/ML SUBCUTANEOUS: SUBCUTANEOUS | 30 days supply | Qty: 15 | Fill #1

## 2022-04-28 NOTE — Unmapped (Signed)
Patient is requesting the following refill  Requested Prescriptions     Pending Prescriptions Disp Refills    pen needle, diabetic (BD ULTRA-FINE NANO PEN NEEDLE) 32 gauge x 5/32 (4 mm) Ndle 200 each 3     Sig: To administer insulin 6-8x/day       Recent Visits  No visits were found meeting these conditions.  Showing recent visits within past 365 days with a meds authorizing provider and meeting all other requirements  Future Appointments  Date Type Provider Dept   06/02/22 Appointment Amie Portland, FNP [facility] [address] [facility]   Showing future appointments within next 365 days with a meds authorizing provider and meeting all other requirements

## 2022-05-01 MED FILL — NORETHINDRONE ACETATE 1 MG-ETHINYL ESTRADIOL 20 MCG TABLET: ORAL | 84 days supply | Qty: 84 | Fill #0

## 2022-05-12 ENCOUNTER — Institutional Professional Consult (permissible substitution): Admit: 2022-05-12 | Discharge: 2022-05-13 | Payer: BLUE CROSS/BLUE SHIELD

## 2022-05-12 MED ORDER — OMNIPOD 5 G6 INTRO KIT (GEN 5) SUBCUTANEOUS CARTRIDGE WITH CONTROLLER
0 refills | 0 days | Status: CP
Start: 2022-05-12 — End: ?

## 2022-05-12 MED ORDER — OMNIPOD 5 G6 PODS (GEN 5) SUBCUTANEOUS CARTRIDGE
11 refills | 0 days | Status: CP
Start: 2022-05-12 — End: ?

## 2022-05-12 NOTE — Unmapped (Signed)
Pt would like RX for omnipod 5.

## 2022-05-12 NOTE — Unmapped (Signed)
Time in/out: 9:55/10:20  Total time: 25 minutes    Assessment/Plan:   Uncontrolled Diabetes, Type 1   Sandra Underwood presents today, on MDI as she [place] very active at the pool and beach and she feels the TSlim isn't practical for her.    Per Dexcom TIR is 46%.  She has high sugars she attributes often to stress, then tends to over-bolus, resulting in low sugars.  1.  No change to current regimen.  She feels doses are correct.  2.  Discussed option of moving to Omnipod 5 as this is something she can wear while active at the beach/pool.  Discussed use of skin tac and skin grips.  RX pended.  She will pick it up if affordable and contact the office for pump start appointment.  3.  Discussed strategies for better glucose control while on MDI.  Encouraged to do her best to bolus premeal.  If drinking ETOH, limit intake and always eat when drinking.  Bolus for food only.  Cautioned against being too aggressive with insulin due to dangers of severe hypoglycemia.  4.  She reports a great deal of stress and anxiety, problems/concerns with her boyfriend.   She states she is safe but it is a stressful situation.  She is looking for a therapist.   I provided contact for [employer], counselor.    5.  Return visit with Dr. Silvestre Moment in 2 months.  Encouraged to contact the office prior with any questions or concerns.    PCP: Herschell Dimes, FNP    CC: Follow up Type 1 Diabetes        Subjective:      Sandra Underwood is a 21 y.o. female who presents for f/u of Type 1 diabetes mellitus.  Last seen by Dr. Silvestre Moment 03/12/2022.    Diagnosed with diabetes age 50 years    Meter downloaded and reviewed. Dexcom G6                      Current Diabetes Medications    Lantus 34 units at bedtime  novolog CR 1:8, if bolusing post meal, will give half total dose (covering food and correction)    Diet:   variable    Exercise: physically active    DM Related Complications:  none    Eye exam last performed: 04/19/2021 (no evidence of DR)    Social History: Living at her boyfriend's place; stressful relationship.       Objective:        There were no vitals taken for this visit.        Past Medical History:   Diagnosis Date   ??? Allergic conjunctivitis    ??? Allergic rhinitis    ??? Arthritis     ankles and elbows   ??? DKA (diabetic ketoacidoses)    ??? DM (diabetes mellitus), type 1 (CMS-HCC)    ??? Food allergy     lemon   ??? Type 1 Duane's syndrome          Current Outpatient Medications:   ???  acetaminophen (TYLENOL) 500 MG tablet, Take 2 tablets (1,000 mg total) by mouth every eight (8) hours as needed for pain., Disp: 30 tablet, Rfl: 0  ???  blood sugar diagnostic Strp, Use to check blood sugar four (4) times a day (before meals and nightly)., Disp: 300 each, Rfl: 12  ???  blood-glucose sensor (DEXCOM G6 SENSOR) Devi, 1 each by Miscellaneous route every ten (10) days.,  Disp: 9 each, Rfl: 2  ???  blood-glucose transmitter (DEXCOM G6 TRANSMITTER) Devi, 1 each by Miscellaneous route Every three (3) months., Disp: 1 each, Rfl: 3  ???  cetirizine (ZYRTEC) 10 MG tablet, Take 2 tablets (20 mg total) by mouth two (2) times a day as needed for allergies., Disp: , Rfl:   ???  cetirizine (ZYRTEC) 10 MG tablet, Take 1-2 tablets (10-20 mg total) by mouth two (2) times a day for hives., Disp: 120 tablet, Rfl: 5  ???  famotidine (PEPCID AC) 20 MG tablet, Take 1 tablet (20 mg total) by mouth Two (2) times a day., Disp: 60 tablet, Rfl: 5  ???  famotidine (PEPCID) 10 MG tablet, Take 1 tablet (10 mg total) by mouth Two (2) times a day., Disp: , Rfl:   ???  insulin admin supplies (INPEN, FOR NOVOLOG OR FIASP,) InPn injection pen, Dispense 1 InPen, Disp: 1 each, Rfl: 0  ???  insulin glargine (LANTUS SOLOSTAR U-100 INSULIN) 100 unit/mL (3 mL) injection pen, Use up to 50 units per day as per MD instructions, backup for pump failure., Disp: 45 mL, Rfl: 12  ???  insulin lispro (HUMALOG KWIKPEN INSULIN) 100 unit/mL injection pen, Use up to 50 units per day, as per MD instructions., Disp: 15 mL, Rfl: 12  ??? insulin lispro (HUMALOG U-100 INSULIN) 100 unit/mL injection, Use up to 100 units/day, divided into 3 times daily before meals as per MD instructions, Disp: 90 mL, Rfl: 12  ???  lancets (ONETOUCH DELICA LANCETS) 33 gauge Misc, Use to check bloof sugar 6 times daily, Disp: 200 each, Rfl: 3  ???  levocetirizine (XYZAL) 5 MG tablet, Take 2 tablets by mouth twice a day for urticaria, Disp: 120 tablet, Rfl: 5  ???  levothyroxine (SYNTHROID) 25 MCG tablet, Take 1 tablet (25 mcg total) by mouth daily., Disp: 90 tablet, Rfl: 3  ???  magnesium 30 mg tablet, Take 1 tablet (30 mg total) by mouth Two (2) times a day., Disp: , Rfl:   ???  montelukast (SINGULAIR) 10 mg tablet, Take 1 tablet (10 mg total) by mouth nightly for 20 days., Disp: 20 tablet, Rfl: 0  ???  norethindrone-ethinyl estradiol (JUNEL 1/20, 21,) 1-20 mg-mcg per tablet, Take 1 tablet by mouth daily., Disp: 84 tablet, Rfl: 0  ???  pen needle, diabetic (BD ULTRA-FINE NANO PEN NEEDLE) 32 gauge x 5/32 (4 mm) Ndle, Use as directed 4 times daily., Disp: 100 each, Rfl: 12  ???  pen needle, diabetic (BD ULTRA-FINE NANO PEN NEEDLE) 32 gauge x 5/32 (4 mm) Ndle, Use to administer insulin 6-8x/day, Disp: 200 each, Rfl: 3  ???  predniSONE (DELTASONE) 50 MG tablet, Take 1 tablet (50 mg total) by mouth in the morning., Disp: , Rfl:   ???  syringe with needle 1 mL 28 gauge x 1/2 Syrg, Use every 7 (seven) days., Disp: , Rfl:   ???  tacrolimus (PROGRAF) 1 MG capsule, Take 1 capsule (1 mg total) by mouth two (2) times a day., Disp: 60 capsule, Rfl: 5  ???  XOLAIR 150 mg/mL syringe, , Disp: , Rfl:     Allergies   Allergen Reactions   ??? Amoxicillin-Pot Clavulanate Rash   ??? Grass Pollen Hives   ??? Lemon Hives   ??? Malt Extract Hives   ??? Metronidazole Hives   ??? Peanut Hives   ??? Tree And Shrub Pollen Hives   ??? Amoxicillin Rash   ??? Penicillins Rash       Family History  Problem Relation Age of Onset   ??? Ulcerative colitis Mother    ??? Asthma Maternal Grandfather    ??? Hypertension Maternal Grandfather    ??? Cataracts Neg Hx    ??? Glaucoma Neg Hx          Lab Results   Component Value Date    A1C 6.5 01/21/2022    A1C 6.5 10/23/2021    A1C 6.7 07/05/2021    A1C 7.2 (H) 04/03/2021       Wt Readings from Last 3 Encounters:   03/12/22 71.4 kg (157 lb 6.4 oz)   10/23/21 72.5 kg (159 lb 12.8 oz)   07/05/21 79.3 kg (174 lb 12.8 oz)       Lab Review    Lab Results   Component Value Date    NA 135 02/05/2021    K 4.4 02/05/2021    CL 104 02/05/2021    CO2 25.0 02/05/2021    BUN 7 (L) 02/05/2021    CREATININE 0.73 02/05/2021    GFRNONAA >=60 03/29/2018    GFRAA >=60 03/29/2018    CALCIUM 9.5 02/05/2021    ALBUMIN 3.4 02/05/2021    PHOS 4.1 05/17/2020       Lab Results   Component Value Date    CHOL 147 02/05/2021     Lab Results   Component Value Date    LDL 69 02/05/2021     Lab Results   Component Value Date    HDL 54 02/05/2021     Lab Results   Component Value Date    TRIG 118 02/05/2021     Lab Results   Component Value Date    ALT 21 02/05/2021     Lab Results   Component Value Date    TSH 0.518 (L) 02/05/2021     Lab Results   Component Value Date    CREATUR 300.6 03/12/2022     Lab Results   Component Value Date    Albumin Quantitative, Urine 1.1 03/12/2022    Albumin/Creatinine Ratio 3.7 03/12/2022     No results found for: LABCREA, MICROALBQTUR, MSHCGMOM, MALBCRERAT  No results found for: PROTEINUR, LABCREA, PCRATIOUR      Alexia Freestone, MSN, AGCNS-BC, APRN, CDCES

## 2022-05-14 MED FILL — LANTUS SOLOSTAR U-100 INSULIN 100 UNIT/ML (3 ML) SUBCUTANEOUS PEN: 90 days supply | Qty: 45 | Fill #2

## 2022-05-28 NOTE — Unmapped (Signed)
Complex Case Management  SUMMARY NOTE    Attempted to contact pt today at Cell number to introduce Complex Case Management services. Left message to return call.; 1st attempt    Discuss at next visit: Introduction to Complex Case Management      Yulianna Folse - High Risk Care Coordinator   [facility]  [address], [address]  [place], [place] [address]  P: [phone] F: [phone]  [email or web address]

## 2022-05-30 MED ORDER — TACROLIMUS 1 MG CAPSULE, IMMEDIATE-RELEASE
ORAL_CAPSULE | Freq: Two times a day (BID) | ORAL | 5 refills | 30 days
Start: 2022-05-30 — End: ?

## 2022-05-30 NOTE — Unmapped (Signed)
[facility] Refill Coordination Note    Specialty Medication(s) to be Shipped:   CF/Pulmonary/Asthma: Tacrolimus 1mg     Other medication(s) to be shipped:  BD Ultra-Fine Nano pen needles, cetirizine 10mg  tab, famotidine 20mg        Sandra Underwood, DOB: [birth year]  Phone: [phone] (home)       All above HIPAA information was verified with patient.     Was a Nurse, learning disability used for this call? No    Completed refill call assessment today to schedule patient's medication shipment from the [facility] [phone]).  All relevant notes have been reviewed.     Specialty medication(s) and dose(s) confirmed: Regimen is correct and unchanged.   Changes to medications: Patrice reports no changes at this time.  Changes to insurance: No  New side effects reported not previously addressed with a pharmacist or physician: None reported  Questions for the pharmacist: No    Confirmed patient received a Conservation officer, historic buildings and a Surveyor, mining with first shipment. The patient will receive a drug information handout for each medication shipped and additional FDA Medication Guides as required.       DISEASE/MEDICATION-SPECIFIC INFORMATION        N/A    SPECIALTY MEDICATION ADHERENCE     Medication Adherence    Patient reported X missed doses in the last month: 0  Specialty Medication: tacrolimus 1 MG capsule (PROGRAF)  Patient is on additional specialty medications: No  Patient is on more than two specialty medications: No  Informant: patient  Reliability of informant: reliable  Reasons for non-adherence: no problems identified              Were doses missed due to medication being on hold? No    tacrolimus 1 MG capsule (PROGRAF)  : 7 days of medicine on hand        REFERRAL TO PHARMACIST     Referral to the pharmacist: Not needed      [facility]     Shipping address confirmed in Epic.     Delivery Scheduled: Yes, Expected medication delivery date: 06/05/22.  However, Rx request for refills was sent to the provider as there are none remaining.     Medication will be delivered via Same Day Courier to the prescription address in Epic W[facility].    Sandra Underwood [facility] Specialty Technician

## 2022-06-02 ENCOUNTER — Ambulatory Visit: Admit: 2022-06-02 | Discharge: 2022-06-03 | Payer: BLUE CROSS/BLUE SHIELD

## 2022-06-02 DIAGNOSIS — N898 Other specified noninflammatory disorders of vagina: Principal | ICD-10-CM

## 2022-06-02 DIAGNOSIS — B001 Herpesviral vesicular dermatitis: Principal | ICD-10-CM

## 2022-06-02 MED ORDER — TACROLIMUS 1 MG CAPSULE, IMMEDIATE-RELEASE
ORAL_CAPSULE | Freq: Two times a day (BID) | ORAL | 1 refills | 30.00000 days
Start: 2022-06-02 — End: ?

## 2022-06-02 MED ORDER — VALACYCLOVIR 1 GRAM TABLET
ORAL_TABLET | Freq: Two times a day (BID) | ORAL | 0 refills | 3 days | Status: CP
Start: 2022-06-02 — End: 2022-06-05

## 2022-06-02 MED ORDER — NORETHINDRONE ACETATE 1 MG-ETHINYL ESTRADIOL 20 MCG TABLET
ORAL_TABLET | Freq: Every day | ORAL | 0 refills | 84 days | Status: CP
Start: 2022-06-02 — End: 2023-06-03

## 2022-06-02 NOTE — Unmapped (Signed)
Complex Case Management  SUMMARY NOTE    High Risk Care Coordinator  spoke with patient and verified correct patient using two identifiers today to introduce the Complex Case Management program.     Discussed the following:  Program Services, Expectations of participation, and Verified Demographics    Program status: Declined  Patient states that she does not need the services of the Complex Case Management program at this time. Care Coordinator has voiced understanding and will send our contact information to patient via her [place] My Chart shall she need to utilize our services in the future as patient has voiced understanding.       Natilee Gauer - High Risk Care Coordinator   [facility]  [address], [address]  [place], [place] [address]  P: [phone] F: [phone]  [email or web address]

## 2022-06-03 NOTE — Unmapped (Signed)
Assessment and Plan:     Sandra Underwood was seen today for follow-up.    Diagnoses and all orders for this visit:    Abnormal bleeding in menstrual cycle    Patient reports that since starting the Junel she has not had a period in over a year.  She states that approximately 2 weeks ago she started her period that lasted for 2 days.  She reported that during those 2 days she had to use 2 pads a day for the bleeding.  That, she had 4 days that she spotted after sex only.  She denies any vaginal pain/pelvic pain/abdominal pain  Reports that she does have a white/yellowish vaginal discharge but reports this is normal for her.  Denies any smell to the discharge.  She denies any vaginal itching.  She denies any vaginal burning.  Denies any urinary symptoms to include urinary urgency/frequency/burning/pain.  On further discussion, patient reports that she ran out of her birth control and then started using her mother's.  Within a week of using her mother's birth control.  She noticed the spotting/abnormal bleeding.    -     norethindrone-ethinyl estradiol (JUNEL 1/20, 21,) 1-20 mg-mcg per tablet; Take 1 tablet by mouth daily.    Vaginal discharge  -     Chlamydia/Gonorrhoeae NAA    Cold sore    Reports that every 2 to 3 months she will have outbreak of fever blisters.  She reports that some of her triggers are sun, stress, getting sick with viral illnesses.  I have discussed with her that my general regimen is Valtrex 1000 mg twice a day for 3 days.  Currently she has an outbreak of cold sore to lower lip.  This has been present for the past 4 days.  She has tried Abreva as well as another over-the-counter medication without any results.  Today I will prescribe Valtrex 1000 mg twice a day for 3 days.  I have also discussed with her that she may possibly need suppressive therapy in the future.  We will discuss this further if needed.    -     valACYclovir (VALTREX) 1000 MG tablet; Take 1 tablet (1,000 mg total) by mouth Two (2) times a day for 3 days.         Return in about 2 months (around 08/02/2022) for Annual physical.    Subjective:     HPI: Sandra Underwood is a 21 y.o. female here for   Chief Complaint   Patient presents with    Follow-up     Cold sore on lip 5 days   Menstrual problem 2 weeks    :    HPI      ROS:   Review of Systems   Constitutional:  Negative for activity change, appetite change, fatigue and unexpected weight change.   HENT:          Fever blister to lower lip   Respiratory:  Negative for chest tightness and shortness of breath.    Cardiovascular:  Negative for chest pain and palpitations.   Gastrointestinal:  Negative for abdominal pain, constipation, diarrhea, nausea and vomiting.   Genitourinary:  Positive for menstrual problem and vaginal discharge. Negative for decreased urine volume, difficulty urinating, dysuria, flank pain, frequency, genital sores, hematuria, pelvic pain, urgency, vaginal bleeding and vaginal pain.   Skin:  Negative for rash.   Neurological:  Negative for dizziness, light-headedness and headaches.   Psychiatric/Behavioral:  Negative for sleep disturbance. The  patient is not nervous/anxious.       Review of systems negative unless otherwise noted as per HPI    Objective:     Visit Vitals  BP 116/62   Pulse 89   Temp 36.3 ??C (97.3 ??F) (Temporal)   Ht 167.6 cm (5' 6)   Wt 69.9 kg (154 lb)   LMP 05/19/2022 (Approximate)   SpO2 98%   BMI 24.86 kg/m??          06/02/22 1051   PainSc: 0-No pain        Physical Exam  Constitutional:       General: She is not in acute distress.     Appearance: Normal appearance. She is well-developed and well-groomed. She is not ill-appearing.   HENT:      Head: Normocephalic and atraumatic.      Comments: Fever blister to lower lip       Mouth/Throat:      Lips: Pink.      Mouth: Mucous membranes are moist.   Eyes:      Conjunctiva/sclera: Conjunctivae normal.   Cardiovascular:      Rate and Rhythm: Normal rate and regular rhythm.      Heart sounds: Normal heart sounds, S1 normal and S2 normal.   Pulmonary:      Effort: Pulmonary effort is normal.      Breath sounds: Normal breath sounds and air entry.   Abdominal:      General: Bowel sounds are normal.      Palpations: Abdomen is soft.      Tenderness: There is no abdominal tenderness.   Skin:     General: Skin is warm and dry.   Neurological:      Mental Status: She is alert and oriented to person, place, and time.   Psychiatric:         Attention and Perception: Attention and perception normal.         Mood and Affect: Mood and affect normal.         Speech: Speech normal.         Behavior: Behavior normal. Behavior is cooperative.         Thought Content: Thought content normal.        [facility]:     Medication adherence and barriers to the treatment plan have been addressed. Opportunities to optimize healthy behaviors have been discussed. Patient / caregiver voiced understanding. Negative for rash.   Neurological:  Negative for dizziness, light-headedness and headaches.   Psychiatric/Behavioral:  Negative for sleep disturbance. The patient is not nervous/anxious.       Review of systems negative unless otherwise noted as per HPI    Objective:     Visit Vitals  BP 116/62   Pulse 89   Temp 36.3 ??C (97.3 ??F) (Temporal)   Ht 167.6 cm (5' 6)   Wt 69.9 kg (154 lb)   LMP 05/19/2022 (Approximate)   SpO2 98%   BMI 24.86 kg/m??          06/02/22 1051   PainSc: 0-No pain        Physical Exam  Constitutional:       General: She is not in acute distress.     Appearance: Normal appearance. She is well-developed and well-groomed. She is not ill-appearing.   HENT:      Head: Normocephalic and atraumatic.      Comments: Fever blister to lower lip       Mouth/Throat:  Lips: Pink.      Mouth: Mucous membranes are moist.   Eyes:      Conjunctiva/sclera: Conjunctivae normal.   Cardiovascular:      Rate and Rhythm: Normal rate and regular rhythm.      Heart sounds: Normal heart sounds, S1 normal and S2 normal.   Pulmonary:      Effort: Pulmonary effort is normal.      Breath sounds: Normal breath sounds and air entry.   Abdominal:      General: Bowel sounds are normal.      Palpations: Abdomen is soft.      Tenderness: There is no abdominal tenderness.   Skin:     General: Skin is warm and dry.   Neurological:      Mental Status: She is alert and oriented to person, place, and time.   Psychiatric:         Attention and Perception: Attention and perception normal.         Mood and Affect: Mood and affect normal.         Speech: Speech normal.         Behavior: Behavior normal. Behavior is cooperative.         Thought Content: Thought content normal.        [facility]:     Medication adherence and barriers to the treatment plan have been addressed. Opportunities to optimize healthy behaviors have been discussed. Patient / caregiver voiced understanding.

## 2022-06-04 ENCOUNTER — Ambulatory Visit: Admit: 2022-06-04 | Discharge: 2022-06-05 | Payer: BLUE CROSS/BLUE SHIELD

## 2022-06-05 MED FILL — CETIRIZINE 10 MG TABLET: ORAL | 30 days supply | Qty: 120 | Fill #4

## 2022-06-05 MED FILL — TACROLIMUS 1 MG CAPSULE, IMMEDIATE-RELEASE: ORAL | 30 days supply | Qty: 60 | Fill #0

## 2022-06-05 MED FILL — BD ULTRA-FINE NANO PEN NEEDLE 32 GAUGE X 5/32" (4 MM): 25 days supply | Qty: 100 | Fill #2

## 2022-06-06 NOTE — Unmapped (Signed)
Good afternoon [name]     I have reviewed your labs and they are negative     Sandra Underwood

## 2022-06-17 ENCOUNTER — Ambulatory Visit: Admit: 2022-06-17 | Discharge: 2022-06-18 | Payer: BLUE CROSS/BLUE SHIELD

## 2022-06-17 DIAGNOSIS — Z13220 Encounter for screening for lipoid disorders: Principal | ICD-10-CM

## 2022-06-17 DIAGNOSIS — Z130 Encounter for screening for diseases of the blood and blood-forming organs and certain disorders involving the immune mechanism: Principal | ICD-10-CM

## 2022-06-17 DIAGNOSIS — B9689 Other specified bacterial agents as the cause of diseases classified elsewhere: Principal | ICD-10-CM

## 2022-06-17 DIAGNOSIS — Z1321 Encounter for screening for nutritional disorder: Principal | ICD-10-CM

## 2022-06-17 DIAGNOSIS — Z0000 Encounter for general adult medical examination without abnormal findings: Principal | ICD-10-CM

## 2022-06-17 DIAGNOSIS — E1065 Type 1 diabetes mellitus with hyperglycemia: Principal | ICD-10-CM

## 2022-06-17 DIAGNOSIS — Z124 Encounter for screening for malignant neoplasm of cervix: Principal | ICD-10-CM

## 2022-06-17 DIAGNOSIS — F419 Anxiety disorder, unspecified: Principal | ICD-10-CM

## 2022-06-17 DIAGNOSIS — N898 Other specified noninflammatory disorders of vagina: Principal | ICD-10-CM

## 2022-06-17 DIAGNOSIS — N760 Acute vaginitis: Principal | ICD-10-CM

## 2022-06-17 DIAGNOSIS — F32A Depression, unspecified depression type: Principal | ICD-10-CM

## 2022-06-17 DIAGNOSIS — E039 Hypothyroidism, unspecified: Principal | ICD-10-CM

## 2022-06-17 LAB — LIPID PANEL
CHOLESTEROL/HDL RATIO SCREEN: 2.7 (ref 1.0–4.5)
CHOLESTEROL: 162 mg/dL (ref <=200)
HDL CHOLESTEROL: 59 mg/dL (ref 40–60)
LDL CHOLESTEROL CALCULATED: 71 mg/dL (ref 40–99)
NON-HDL CHOLESTEROL: 103 mg/dL (ref 70–130)
TRIGLYCERIDES: 158 mg/dL — ABNORMAL HIGH (ref 0–150)
VLDL CHOLESTEROL CAL: 31.6 mg/dL — ABNORMAL HIGH (ref 8–29)

## 2022-06-17 LAB — CBC
HEMATOCRIT: 41.2 % (ref 34.0–44.0)
HEMOGLOBIN: 13.7 g/dL (ref 11.3–14.9)
MEAN CORPUSCULAR HEMOGLOBIN CONC: 33.3 g/dL (ref 32.0–36.0)
MEAN CORPUSCULAR HEMOGLOBIN: 28.7 pg (ref 25.9–32.4)
MEAN CORPUSCULAR VOLUME: 86.2 fL (ref 77.6–95.7)
MEAN PLATELET VOLUME: 9.3 fL (ref 6.8–10.7)
PLATELET COUNT: 386 10*9/L (ref 150–450)
RED BLOOD CELL COUNT: 4.78 10*12/L (ref 3.95–5.13)
RED CELL DISTRIBUTION WIDTH: 13.6 % (ref 12.2–15.2)
WBC ADJUSTED: 8.1 10*9/L (ref 3.6–11.2)

## 2022-06-17 LAB — COMPREHENSIVE METABOLIC PANEL
ALBUMIN: 3.9 g/dL (ref 3.4–5.0)
ALKALINE PHOSPHATASE: 81 U/L (ref 46–116)
ALT (SGPT): 16 U/L (ref 10–49)
ANION GAP: 7 mmol/L (ref 5–14)
AST (SGOT): 20 U/L (ref <=34)
BILIRUBIN TOTAL: 0.4 mg/dL (ref 0.3–1.2)
BLOOD UREA NITROGEN: 10 mg/dL (ref 9–23)
BUN / CREAT RATIO: 13
CALCIUM: 9.4 mg/dL (ref 8.7–10.4)
CHLORIDE: 105 mmol/L (ref 98–107)
CO2: 26 mmol/L (ref 20.0–31.0)
CREATININE: 0.8 mg/dL
EGFR CKD-EPI (2021) FEMALE: >90 mL/min/{1.73_m2} (ref >=60)
GLUCOSE RANDOM: 187 mg/dL — ABNORMAL HIGH (ref 70–179)
POTASSIUM: 3.9 mmol/L (ref 3.4–4.8)
PROTEIN TOTAL: 7.5 g/dL (ref 5.7–8.2)
SODIUM: 138 mmol/L (ref 135–145)

## 2022-06-17 LAB — HEMOGLOBIN A1C
ESTIMATED AVERAGE GLUCOSE: 148 mg/dL
HEMOGLOBIN A1C: 6.8 % — ABNORMAL HIGH (ref 4.8–5.6)

## 2022-06-17 LAB — TSH: THYROID STIMULATING HORMONE: 1.983 u[IU]/mL (ref 0.550–4.780)

## 2022-06-17 MED ORDER — CLINDAMYCIN HCL 300 MG CAPSULE
ORAL_CAPSULE | Freq: Two times a day (BID) | ORAL | 0 refills | 7 days | Status: CP
Start: 2022-06-17 — End: 2022-06-24
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
## 2022-06-19 NOTE — Unmapped (Signed)
Good afternoon Sandra Underwood     I have reviewed your labs and things look good   I am still awaiting the results of your PAP/HPV test and Vit D     [name]

## 2022-06-20 LAB — VITAMIN D 25 HYDROXY: VITAMIN D, TOTAL (25OH): 30.2 ng/mL (ref 20.0–80.0)

## 2022-06-20 NOTE — Unmapped (Signed)
Good morning Sandra Underwood     I have reviewed your HPV results and they are negative   I am still awaiting the PAP results     [name]

## 2022-06-23 NOTE — Unmapped (Signed)
Good morning Sandra Underwood     I have reviewed your Vit D level and it is normal     [name]

## 2022-06-24 ENCOUNTER — Ambulatory Visit: Admit: 2022-06-24 | Discharge: 2022-06-25 | Payer: BLUE CROSS/BLUE SHIELD

## 2022-06-24 DIAGNOSIS — F32A Depression, unspecified depression type: Principal | ICD-10-CM

## 2022-06-24 DIAGNOSIS — T7491XA Unspecified adult maltreatment, confirmed, initial encounter: Principal | ICD-10-CM

## 2022-06-24 DIAGNOSIS — F419 Anxiety disorder, unspecified: Principal | ICD-10-CM

## 2022-06-24 MED ORDER — DULOXETINE 30 MG CAPSULE,DELAYED RELEASE
ORAL_CAPSULE | Freq: Every day | ORAL | 0 refills | 60 days | Status: CP
Start: 2022-06-24 — End: 2023-06-24
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
## 2022-06-27 NOTE — Unmapped (Signed)
[facility] Refill Coordination Note    Specialty Medication(s) to be Shipped:   CF/Pulmonary/Asthma: Tacrolimus 1 mg    Other medication(s) to be shipped: No additional medications requested for fill at this time     Sandra Underwood, DOB: 07/12/2001  Phone: [phone] (home)       All above HIPAA information was verified with patient.     Was a Nurse, learning disability used for this call? No    Completed refill call assessment today to schedule patient's medication shipment from the [facility] [phone]).  All relevant notes have been reviewed.     Specialty medication(s) and dose(s) confirmed: Regimen is correct and unchanged.   Changes to medications: Mama reports starting the following medications: duloxetine 30mg , once daily started 06/25/22  Changes to insurance: No  New side effects reported not previously addressed with a pharmacist or physician: None reported  Questions for the pharmacist: No    Confirmed patient received a Conservation officer, historic buildings and a Surveyor, mining with first shipment. The patient will receive a drug information handout for each medication shipped and additional FDA Medication Guides as required.       DISEASE/MEDICATION-SPECIFIC INFORMATION        N/A    SPECIALTY MEDICATION ADHERENCE     Medication Adherence    Patient reported X missed doses in the last month: 0  Specialty Medication: tacrolimus 1 MG capsule (PROGRAF)  Patient is on additional specialty medications: No  Patient is on more than two specialty medications: No  Informant: patient  Reliability of informant: reliable  Provider-estimated medication adherence level: good  Reasons for non-adherence: no problems identified                                Were doses missed due to medication being on hold? No    tacrolimus 1 MG capsule (PROGRAF)  : 7-10 days of medicine on hand       REFERRAL TO PHARMACIST     Referral to the pharmacist: Not needed      [facility]     Shipping address confirmed in Epic.     Delivery Scheduled: Yes, Expected medication delivery date: 07/04/22.     Medication will be delivered via Same Day Courier to the prescription address in Epic WAM.    [facility] Specialty Technician

## 2022-07-03 NOTE — Unmapped (Signed)
Good afternoon Sandra Underwood     I have reviewed your PAP and it is negative  You will need repeat in 3 years    [name]

## 2022-07-04 MED FILL — TACROLIMUS 1 MG CAPSULE, IMMEDIATE-RELEASE: ORAL | 30 days supply | Qty: 60 | Fill #1

## 2022-07-10 NOTE — Unmapped (Signed)
[place] [facility] ([facility]  Network Health Plan (NHP) Maintenance      DM lab timeline targets updated per program requirements.   Labs are up to date.        Samael Blades, CPhT   Certified Pharmacy Technician   [place] [facility] ([facility])   P [phone], F [phone]

## 2022-07-10 NOTE — Unmapped (Signed)
[place] [facility] ([facility])  RECRUITMENT SUMMARY NOTE   Network Health Plan (NHP) Diabetes       Patient was outreached to schedule follow-up CAMP appointment. Patient confirmed appointment.        Emberlin Verner, CPhT   Certified Pharmacy Technician   [place] [facility] ([facility]AMP)   P [phone], F [phone]

## 2022-07-15 MED FILL — LEVOTHYROXINE 25 MCG TABLET: ORAL | 90 days supply | Qty: 90 | Fill #3

## 2022-07-15 MED FILL — BD ULTRA-FINE NANO PEN NEEDLE 32 GAUGE X 5/32" (4 MM): 25 days supply | Qty: 100 | Fill #3

## 2022-07-15 MED FILL — CETIRIZINE 10 MG TABLET: ORAL | 30 days supply | Qty: 120 | Fill #5

## 2022-07-15 MED FILL — HUMALOG KWIKPEN (U-100) INSULIN 100 UNIT/ML SUBCUTANEOUS: SUBCUTANEOUS | 30 days supply | Qty: 15 | Fill #2

## 2022-07-16 DIAGNOSIS — F32A Depression, unspecified depression type: Principal | ICD-10-CM

## 2022-07-16 DIAGNOSIS — F419 Anxiety disorder, unspecified: Principal | ICD-10-CM

## 2022-07-16 MED ORDER — DULOXETINE 30 MG CAPSULE,DELAYED RELEASE
ORAL_CAPSULE | Freq: Every day | ORAL | 1 refills | 0.00000 days
Start: 2022-07-16 — End: ?

## 2022-07-16 NOTE — Unmapped (Signed)
Patient is requesting the following refill  Requested Prescriptions     Pending Prescriptions Disp Refills    DULoxetine (CYMBALTA) 30 MG capsule [Pharmacy Med Name: DULOXETINE HCL DR 30 MG CAP] 90 capsule 1     Sig: TAKE 1 CAPSULE BY MOUTH EVERY DAY       Recent Visits  Date Type Provider Dept   06/24/22 Office Visit Amie Portland, FNP [facility] [address] [facility]   06/17/22 Office Visit Amie Portland, FNP [place] [facility] [address] [facility]   06/02/22 Office Visit Amie Portland, FNP [place] [facility] [address] [facility]   Showing recent visits within past 365 days with a meds authorizing provider and meeting all other requirements  Future Appointments  Date Type Provider Dept   08/04/22 Appointment Amie Portland, FNP [place] [facility] [address] [facility]   12/24/22 Appointment Amie Portland, FNP [place] [facility] [address] [facility]   Showing future appointments within next 365 days with a meds authorizing provider and meeting all other requirements       Labs: Not applicable this refill

## 2022-07-17 MED ORDER — DULOXETINE 30 MG CAPSULE,DELAYED RELEASE
ORAL_CAPSULE | Freq: Every day | ORAL | 1 refills | 90 days | Status: CP
Start: 2022-07-17 — End: ?
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
## 2022-07-21 ENCOUNTER — Ambulatory Visit
Admit: 2022-07-21 | Discharge: 2022-07-22 | Payer: BLUE CROSS/BLUE SHIELD | Attending: Student in an Organized Health Care Education/Training Program | Primary: Student in an Organized Health Care Education/Training Program

## 2022-07-21 DIAGNOSIS — E1065 Type 1 diabetes mellitus with hyperglycemia: Principal | ICD-10-CM

## 2022-07-21 NOTE — Unmapped (Signed)
dexcom Meter  uploaded to clarity. POC Glucose and A1c done today. PP fasting. 230 mg/dl.

## 2022-07-21 NOTE — Unmapped (Addendum)
Check out the new Ilet pump to see if you might be interested: [email or web address]

## 2022-07-24 NOTE — Unmapped (Unsigned)
[facility] ([facility]-- {CAMPClinics2:62265}    To patients reading this note: Please be advised that the primary purpose of this note is for [place] to keep track of your care and communicate with other members of your medical team. These suggested changes are intended to enhance the overall care you receive. Approved changes will be communicated to you by your care team.     Sandra Underwood is a 21 y.o. female who presents for a follow up telephone visit with the [facility] for diabetes management.    Assessment/Plan:       No problem-specific Assessment & Plan notes found for this encounter.     Expand All Collapse All  [place] [facility] ([facility]) [facility]-- Network Health Plan - Diabetes Targeted     Sandra Underwood is a 21 y.o. female who presents for an initial telephone visit with the [facility] for diabetes management.     Assessment/Plan:       Type 1 diabetes mellitus with hyperglycemia, with long-term current use of insulin (CMS-HCC)  Patient reported treatment: novolog via pump - 1u per 8 carbs, 1u for every 30 correction, and basal at 1.5u every hour (followed by endo).   Home blood glucose monitoring: Dexcom G6.   Hypoglycemia: Patient did not report hypoglycemia.   Known DM complications: no known complications        Patient reported treatment: {CAMP DM Oral DM Meds:63028}, {CAMP INJ/INSULIN DM:63035}   Previous treatments: {CAMP DM Meds Classes:64151}   Home blood glucose monitoring: ***  Hypoglycemia: Patient {CAMP Reports Not Report:64142} hypoglycemia. Symptoms: {symptoms; hypoglycemia:17902}. Reason(s) for hypoglycemia: {AMB PHA Hypoglycemia Reason:21032587}   Hyperglycemia: Patient {CAMP Reports Not Report:64142} hyperglycemia. Symptoms: {symptoms; hyperglycemia:17903}  Known DM complications: {CAMPDMComplications:60292}     Assessment / Plan  Lab Results   Component Value Date    A1C 6.8 (H) 06/17/2022     Currently {control level:39635}. Goal A1c {A1c goal:39664} per ADA guidelines.    {CAMP Recommendations 2.0:93072}  Reviewed signs and symptoms of hypoglycemia and appropriate treatment when blood glucose is <70  Recommend routine blood glucose monitoring: ***  Counseled regarding lifestyle modifications including 150 minutes of exercise per week and low carb diet (~45 grams of carbohydrates with each meal)   Due for {ZOXW:96045}  ***       Currently controlled per patient report.  Goal A1c <7% per ADA guidelines.    Recommend converting patient to humalog, since novolog is not covered by [facility]. Recommend sending new prescriptions for humalog vials for insulin pump, humalog pens, lantus pens, contour meter + testing supplies to be obtained through [facility] benefits to her [facility] pharmacy of choice for cost savings to patient    Patient expressed concern over cost of Dexcom sensors. She recently got a job at [employer] and thus, her insurance changed. States she paid $1,000 for a 3 month supply with [employer]. I have called [facility] to confirm that what she paid for the past three months worth of sensors went towards her deductible through medical insurance. She verbalized understanding of this.   Due for repeat A1c at follow up, as well as a retinal eye exam   _____________________________________________________          Medication Management  Medications reviewed with patient & education provided regarding medications and [facility] clinic. Reviewed the indication, dose, and frequency of each medication with patient. Patient verbalized understanding.     {    Coding tips -  Do not edit this text, it will delete upon signing of note!    Telephone visits [phone] for Physicians and APP???s and [phone] for Non- Physician Clinicians)- Only use minutes on the phone to determine level of service.    Video visits [phone]) - Use both minutes on video and pre/post minutes to determine level of service.       :75688}  The patient reports they are currently: {patient location:81390}. I spent *** minutes on the {phone audio video visit:67489} with the patient on the date of service. I spent an additional *** minutes on pre- and post-visit activities on the date of service.     The patient was physically located in [place] or a state in which I am permitted to provide care. The patient and/or parent/guardian understood that s/he may incur co-pays and cost sharing, and agreed to the telemedicine visit. The visit was reasonable and appropriate under the circumstances given the patient's presentation at the time.    The patient and/or parent/guardian has been advised of the potential risks and limitations of this mode of treatment (including, but not limited to, the absence of in-person examination) and has agreed to be treated using telemedicine. The patient's/patient's family's questions regarding telemedicine have been answered.     If the visit was completed in an ambulatory setting, the patient and/or parent/guardian has also been advised to contact their provider???s office for worsening conditions, and seek emergency medical treatment and/or call 911 if the patient deems either necessary.          Future Appointments   Date Time Provider Department Center   08/04/2022  2:00 PM Amie Portland, FNP [facility]   08/20/2022 10:00 AM Clovia Cuff [facility]   08/27/2022 10:40 AM Theodoro Kos, OD [facility]   12/22/2022  9:00 AM Rosiland Oz, MD [facility]   12/24/2022 10:00 AM Amie Portland, FNP [facility]       Notes and recommendations from today's visit {ACTIONS; HAVE BEEN/WILL BE:32202} {fax vs epic:39716} to ***    ***  Subjective    Medication Regimen Adjustments:   Medication changes since last visit: ***  Side effects with any new medications: {CAMP Reports Not Report:64142}***    Adherence and Lifestyle  Adherence: ***    Diet: ** Ask pt: 'Have you ever met with a dietician or participated in a diabetes education class?' If no, consider DSME or RD referral (REF 17) **    Exercise: ***      Med Pre Visit Patient Level Data    PCP confirmed on care team?: Yes  How many pharmacies do you obtain medications from?: 1  What type of pharmacies do you use?: Retail Pharmacy  Who helps you with your medications?: Manage Myself  Do you have issues affording the cost of any of your medications?: No  DO you have fills for 90 day supplies?: Yes  Are there any other things you hope to discuss with your pharmacist during your visit?: No           Objective    Lab Results   Component Value Date    HGB A1C, POC 6.5 01/21/2022    HGB A1C, POC 6.5 10/23/2021    HGB A1C, POC 6.7 07/05/2021    Hemoglobin A1C 6.8 (H) 06/17/2022    Albumin/Creatinine Ratio 3.7 03/12/2022    Albumin/Creatinine Ratio  02/05/2021      Comment:  Unable to calculate due to value below lower limit of assay linearity.    Cholesterol 162 06/17/2022    HDL 59 06/17/2022    Triglycerides 158 (H) 06/17/2022     Lab Results   Component Value Date    BUN 10 06/17/2022    Creatinine 0.80 06/17/2022    Sodium 138 06/17/2022    Sodium Whole Blood 131 (L) 09/16/2017    Potassium 3.9 06/17/2022    Potassium, Bld 4.3 09/16/2017     The ASCVD Risk score (Arnett DK, et al., 2019) failed to calculate for the following reasons:    The 2019 ASCVD risk score is only valid for ages 44 to 65    Note: For patients with SBP <90 or >200, Total Cholesterol <130 or >320, HDL <20 or >100 which are outside of the allowable range, the calculator will use these upper or lower values to calculate the patient???s risk score.    There were no vitals filed for this visit.   Current Outpatient Medications   Medication Sig Dispense Refill    acetaminophen (TYLENOL) 500 MG tablet Take 2 tablets (1,000 mg total) by mouth every eight (8) hours as needed for pain. 30 tablet 0    blood sugar diagnostic Strp Use to check blood sugar four (4) times a day (before meals and nightly). 300 each 12    blood-glucose sensor (DEXCOM G6 SENSOR) Devi 1 each by Miscellaneous route every ten (10) days. 9 each 2    blood-glucose transmitter (DEXCOM G6 TRANSMITTER) Devi 1 each by Miscellaneous route Every three (3) months. 1 each 3    cetirizine (ZYRTEC) 10 MG tablet Take 1-2 tablets (10-20 mg total) by mouth two (2) times a day for hives. 120 tablet 5    DULoxetine (CYMBALTA) 30 MG capsule TAKE 1 CAPSULE BY MOUTH EVERY DAY 90 capsule 1    famotidine (PEPCID AC) 20 MG tablet Take 1 tablet (20 mg total) by mouth Two (2) times a day. 60 tablet 5    insulin glargine (LANTUS SOLOSTAR U-100 INSULIN) 100 unit/mL (3 mL) injection pen Use up to 50 units per day as per MD instructions, backup for pump failure. (Patient taking differently: 0.34 mL (34 Units total). Use up to 50 units per day as per MD instructions, backup for pump failure.) 45 mL 12    insulin lispro (HUMALOG KWIKPEN INSULIN) 100 unit/mL injection pen Use up to 50 units per day, as per MD instructions. 15 mL 12    insulin lispro (HUMALOG U-100 INSULIN) 100 unit/mL injection Use up to 100 units/day, divided into 3 times daily before meals as per MD instructions 90 mL 12    insulin pump cart,auto,BT-cntr (OMNIPOD 5 G6 INTRO KIT, GEN 5,) Crtg Started kit includes controller and 11 pods.  Change pods every 3 days or as instructed by your health care professional. 1 each 0    insulin pump cart,automated,BT (OMNIPOD 5 G6 PODS, GEN 5,) Crtg Pods to be changed every 3 days or as instructed by your health care professional. 10 each 11    lancets (ONETOUCH DELICA LANCETS) 33 gauge Misc Use to check bloof sugar 6 times daily 200 each 3    levothyroxine (SYNTHROID) 25 MCG tablet Take 1 tablet (25 mcg total) by mouth daily. 90 tablet 3    montelukast (SINGULAIR) 10 mg tablet Take 1 tablet (10 mg total) by mouth nightly for 20 days. 20 tablet 0    norethindrone-ethinyl estradiol (JUNEL 1/20, 21,) 1-20 mg-mcg per  tablet Take 1 tablet by mouth daily. 84 tablet 0    pen needle, diabetic (BD ULTRA-FINE NANO PEN NEEDLE) 32 gauge x 5/32 (4 mm) Ndle Use as directed 4 times daily. 100 each 12    pen needle, diabetic (BD ULTRA-FINE NANO PEN NEEDLE) 32 gauge x 5/32 (4 mm) Ndle Use to administer insulin 6-8x/day 200 each 3    syringe with needle 1 mL 28 gauge x 1/2 Syrg Use every 7 (seven) days.      tacrolimus (PROGRAF) 1 MG capsule Take 1 capsule (1 mg total) by mouth two (2) times a day. 60 capsule 1     No current facility-administered medications for this visit.

## 2022-07-24 NOTE — Unmapped (Signed)
[place] [facility] ([facility]    Name: Shelley Puls  Date of Birth: Mar 16, 2001  Today's Date: 07/24/2022  Age: 21 y.o.         Unable to reach patient for scheduled CAMP visit today. Please contact CAMP clinic with any further questions.          Duke Salvia, PharmD  Clinical Pharmacist- [place] [facility] ([facility])  [facility] Clinic: [phone] - Fax: [phone]

## 2022-07-25 MED ORDER — TACROLIMUS 1 MG CAPSULE, IMMEDIATE-RELEASE
ORAL_CAPSULE | Freq: Two times a day (BID) | ORAL | 1 refills | 30 days
Start: 2022-07-25 — End: ?

## 2022-07-25 NOTE — Unmapped (Signed)
[place] [facility] ([facility])  RECRUITMENT SUMMARY NOTE   Network Health Plan (NHP) Diabetes       Patient was outreached to reschedule CAMP appointment. Unable to contact- left message. Enforcement letter sent.        Jerolyn Center, CPhT   Certified Pharmacy Technician   [place] [facility] ([facility])   P [phone], F [phone]

## 2022-07-28 NOTE — Unmapped (Signed)
I saw and evaluated the patient, participating in the key portions of the service.  I reviewed the resident???s note.  I agree with the resident???s findings and plan. Sladen Plancarte H Hoyte Ziebell, MD

## 2022-07-30 NOTE — Unmapped (Addendum)
Sandra Underwood has been contacted in regards to their refill of tacrolimus 1 MG capsule (PROGRAF). At this time, they have declined refill due to  Sandra Underwood was told by Dr Tish Men she will need an appointment before the medication could be refilled. Sandra Underwood states her appointment is scheduled for October 09,2023. She asked for a call back at that time.  Marland Kitchen Refill assessment call date has been updated per the patient's request.

## 2022-08-04 ENCOUNTER — Ambulatory Visit: Admit: 2022-08-04 | Payer: BLUE CROSS/BLUE SHIELD

## 2022-08-08 ENCOUNTER — Ambulatory Visit
Admit: 2022-08-08 | Discharge: 2022-08-08 | Disposition: A | Discharge status: Home / Self Care | Payer: BLUE CROSS/BLUE SHIELD | Attending: Student in an Organized Health Care Education/Training Program

## 2022-08-08 ENCOUNTER — Emergency Department
Admit: 2022-08-08 | Discharge: 2022-08-08 | Disposition: A | Discharge status: Home / Self Care | Payer: BLUE CROSS/BLUE SHIELD | Attending: Student in an Organized Health Care Education/Training Program

## 2022-08-08 DIAGNOSIS — S0083XA Contusion of other part of head, initial encounter: Principal | ICD-10-CM

## 2022-08-08 DIAGNOSIS — S80211A Abrasion, right knee, initial encounter: Principal | ICD-10-CM

## 2022-08-08 DIAGNOSIS — S0990XA Unspecified injury of head, initial encounter: Principal | ICD-10-CM

## 2022-08-08 DIAGNOSIS — T7491XA Unspecified adult maltreatment, confirmed, initial encounter: Principal | ICD-10-CM

## 2022-08-08 DIAGNOSIS — S80212A Abrasion, left knee, initial encounter: Principal | ICD-10-CM

## 2022-08-08 MED ADMIN — acetaminophen (TYLENOL) tablet 650 mg: 650 mg | ORAL | @ 18:00:00 | Stop: 2022-08-08

## 2022-08-08 NOTE — Unmapped (Signed)
Pt given DC instructions and explanation of services provided while in the dept. Pt verbalized understanding and expressed no questions or concerns. Pt ambulatory out of dept without complication. Pt in NAD upon departure.
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
## 2022-08-08 NOTE — Unmapped (Signed)
Research therapists on [email or web address]

## 2022-08-11 MED ORDER — TACROLIMUS 1 MG CAPSULE, IMMEDIATE-RELEASE
ORAL_CAPSULE | Freq: Two times a day (BID) | ORAL | 5 refills | 30 days
Start: 2022-08-11 — End: ?

## 2022-08-14 MED ORDER — CETIRIZINE 10 MG TABLET
ORAL_TABLET | Freq: Two times a day (BID) | ORAL | 5 refills | 30 days
Start: 2022-08-14 — End: ?

## 2022-08-15 ENCOUNTER — Ambulatory Visit: Admit: 2022-08-15 | Discharge: 2022-08-16 | Payer: PRIVATE HEALTH INSURANCE

## 2022-08-15 DIAGNOSIS — F419 Anxiety disorder, unspecified: Principal | ICD-10-CM

## 2022-08-15 DIAGNOSIS — F32A Depression, unspecified depression type: Principal | ICD-10-CM

## 2022-08-15 DIAGNOSIS — T7491XS Unspecified adult maltreatment, confirmed, sequela: Principal | ICD-10-CM

## 2022-08-15 MED ORDER — DULOXETINE 60 MG CAPSULE,DELAYED RELEASE
ORAL_CAPSULE | Freq: Every day | ORAL | 1 refills | 90 days | Status: CP
Start: 2022-08-15 — End: 2023-08-15

## 2022-08-15 NOTE — Unmapped (Signed)
[facility] Refill Coordination Note    Specialty Medication(s) to be Shipped:   CF/Pulmonary/Asthma: tacrolimus 1mg     Other medication(s) to be shipped:  Humalog Kwikpen, BD Ultra Fine Nano pen needle, cetirizine 10mg      Sandra Underwood, DOB: [birth year]  Phone: [phone] (home)       All above HIPAA information was verified with patient.     Was a Nurse, learning disability used for this call? No    Completed refill call assessment today to schedule patient's medication shipment from the [facility] [phone]).  All relevant notes have been reviewed.     Specialty medication(s) and dose(s) confirmed: Regimen is correct and unchanged.   Changes to medications: Sandra Underwood reports no changes at this time.  Changes to insurance: No  New side effects reported not previously addressed with a pharmacist or physician: None reported  Questions for the pharmacist: No    Confirmed patient received a Conservation officer, historic buildings and a Surveyor, mining with first shipment. The patient will receive a drug information handout for each medication shipped and additional FDA Medication Guides as required.       DISEASE/MEDICATION-SPECIFIC INFORMATION        N/A    SPECIALTY MEDICATION ADHERENCE     Medication Adherence    Patient reported X missed doses in the last month: 0  Specialty Medication: tacrolimus 1 MG capsule (PROGRAF)  Patient is on additional specialty medications: No  Patient is on more than two specialty medications: No  Any gaps in refill history greater than 2 weeks in the last 3 months: no  Demonstrates understanding of importance of adherence: yes  Informant: patient  Reliability of informant: reliable  Provider-estimated medication adherence level: good  Patient is at risk for Non-Adherence: No  Reasons for non-adherence: no problems identified                                Were doses missed due to medication being on hold? No    tacrolimus 1 MG capsule (PROGRAF)  : 7+ days of medicine on hand       REFERRAL TO PHARMACIST     Referral to the pharmacist: Not needed      [facility]     Shipping address confirmed in Epic.     Delivery Scheduled: Yes, Expected medication delivery date: 08/20/22.     Medication will be delivered via Same Day Courier to the prescription address in Epic WAM.    Marciana Uplinger' [address] [facility] Specialty Technician
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
## 2022-08-18 MED ORDER — CETIRIZINE 10 MG TABLET
ORAL_TABLET | Freq: Two times a day (BID) | ORAL | 5 refills | 30 days
Start: 2022-08-18 — End: ?

## 2022-08-19 NOTE — Unmapped (Signed)
Patient was sent vECM Referral for Patient to Self-Schedule [facility] appointment. Message was sent to patient MyChart.  Abstraction Result Flowsheet Data    This patient's last AWV date: [facility] Last Medicare Wellness Visit Date: Not Found  This patients last WCC/CPE date: : 06/17/2022      Reason for Encounter  Reason for Encounter: Outreach  Primary Reason for Outreach: Behavioral Health Follow Up  Text Message: No  MyChart Message: Yes  Outreach Call Outcome: Other Human resources officer)

## 2022-08-20 ENCOUNTER — Institutional Professional Consult (permissible substitution): Admit: 2022-08-20 | Discharge: 2022-08-20 | Payer: PRIVATE HEALTH INSURANCE

## 2022-08-20 DIAGNOSIS — T7491XD Unspecified adult maltreatment, confirmed, subsequent encounter: Principal | ICD-10-CM

## 2022-08-20 DIAGNOSIS — L508 Other urticaria: Principal | ICD-10-CM

## 2022-08-20 MED FILL — CETIRIZINE 10 MG TABLET: ORAL | 30 days supply | Qty: 120 | Fill #0

## 2022-08-20 MED FILL — BD ULTRA-FINE NANO PEN NEEDLE 32 GAUGE X 5/32" (4 MM): 25 days supply | Qty: 100 | Fill #4

## 2022-08-20 MED FILL — TACROLIMUS 1 MG CAPSULE, IMMEDIATE-RELEASE: ORAL | 30 days supply | Qty: 60 | Fill #0

## 2022-08-20 MED FILL — HUMALOG KWIKPEN (U-100) INSULIN 100 UNIT/ML SUBCUTANEOUS: SUBCUTANEOUS | 30 days supply | Qty: 15 | Fill #3
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
## 2022-08-20 NOTE — Unmapped (Signed)
[facility] Appointment Note  Study: INHALE-3  Description:  A 17-Week Randomized Trial and a 13-Week Extension, Evaluating the Efficacy and Safety of Inhaled Insulin (Afrezza??) Combined with Insulin Degludec Versus Usual Care in Adults with Type 1 Diabetes   [place] IRB#: [id number]  PI: [name], FNP-C  Visit: Screening          Patient arrived for screening visit. ICF and HIPAA forms signed; copies provided. eCRFs completed per protocol. POC A1c completed, result 7.6%. Pregnancy test completed, negative result; LOT [id number]. Exp date 02/25/2024. [name], FNP-C performed physical exam and is responsible investigator for appointment. Patient placed CGM on abdomen and was provided with CGM to take home. Place second CGM on 28Oct2023. Randomization set for 07Nov2023 at 900. Patient is agreeable, has no questions or concerns and is willing to continue participation at this time.      [name], RDN LDN CDCES

## 2022-08-22 DIAGNOSIS — N926 Irregular menstruation, unspecified: Principal | ICD-10-CM

## 2022-08-22 MED ORDER — JUNEL 1/20 (21) 1 MG-20 MCG TABLET
ORAL_TABLET | Freq: Every day | ORAL | 1 refills | 84.00000 days | Status: CP
Start: 2022-08-22 — End: ?

## 2022-08-26 NOTE — Unmapped (Signed)
[facility] ([facility]-- [employer] (NHP)      Name: Sandra Underwood  Date of Birth: 03/31/2001  MRN: [id number]      Patient does not currently meet eligibility requirements for continued enrollment in the [facility]. Please contact the [facility] Clinic to discuss requirements for re-enrollment.Marland Kitchen VBBD Status: Inactive        Tenna Delaine, CPhT   Certified Pharmacy Technician   [facility] ([facility])   P [phone], F [phone]

## 2022-08-27 ENCOUNTER — Ambulatory Visit: Admit: 2022-08-27 | Discharge: 2022-08-28 | Payer: PRIVATE HEALTH INSURANCE

## 2022-08-27 NOTE — Unmapped (Signed)
1) DM type I without retinopathy OD/OS  -- patient apprised of findings  -- educated patient to continue close monitoring of blood sugar with PCP  -- RTC in 1 year with Dr. Sheppard Evens for diabetic eye exam    2) Duane's syndrome OS - abduction deficit, normal ocular alignment in primary gaze  -- monitor    3) Myopia OD/OS  -- new spec Rx given  -- finalized CL Rx - trials dispensed  -- follow-up via MyChart if issues with trials  -- patient re-educated on proper lens care and hygiene  -- reassess yearly

## 2022-09-01 NOTE — Unmapped (Signed)
Outreached to patient to schedule Behavioral Health Visit.  Patient was unable to be contacted. Left Message.    Virtual Behavioral Health Visit outreach letter was sent to patient through Mychart.    Sent Patient Text Message via Engineer, civil (consulting) system.    Abstraction Result Flowsheet Data    This patient's last AWV date: [facility] Last Medicare Wellness Visit Date: Not Found  This patients last WCC/CPE date: : 06/17/2022      Reason for Encounter  Reason for Encounter: Outreach  Primary Reason for Outreach: Behavioral Health Follow Up  Text Message: No  MyChart Message: No  Outreach Call Outcome: Left message (Text Message Sent)

## 2022-09-02 NOTE — Unmapped (Signed)
Scheduled NHP Virtual Behavioral Health Appointment at Nov 13th for [facility], [place].    Abstraction Result Flowsheet Data    This patient's last AWV date: [facility] Last Medicare Wellness Visit Date: Not Found  This patients last WCC/CPE date: : 06/17/2022      Reason for Encounter  Reason for Encounter: Outreach  Primary Reason for Outreach: Behavioral Health Follow Up (NHP-Do Not Bill//Behavioral Health [facility]))  Text Message: No  MyChart Message: No  Outreach Call Outcome: Scheduled Video

## 2022-09-09 ENCOUNTER — Institutional Professional Consult (permissible substitution): Admit: 2022-09-09 | Discharge: 2022-09-10 | Payer: PRIVATE HEALTH INSURANCE

## 2022-09-09 DIAGNOSIS — Z006 Encounter for examination for normal comparison and control in clinical research program: Principal | ICD-10-CM

## 2022-09-09 NOTE — Unmapped (Signed)
[facility] Appointment Note  Study: INHALE-3  Description:  A 17-Week Randomized Trial and a 13-Week Extension, Evaluating the Efficacy and Safety of Inhaled Insulin (Afrezza??) Combined with Insulin Degludec Versus Usual Care in Adults with Type 1 Diabetes   [place] IRB#: [id number]  PI: [name], FNP-C  Visit: Randomization          Patient arrived for randomization visit. Eligibility confirmed by [name], FNP-C. CGM data reviewed and validated 10 days of data. Labs drawn at 941 am. [name] performed physical exam and is the investigator responsible for visit. Participant is willing to continue, and was randomized. Meal challenge completed per protocol. eCRFs completed per protocol. No questions or concerns, she is willing to continue participation.      [name], RDN LDN CDCES

## 2022-09-12 NOTE — Unmapped (Signed)
[facility] Appointment Note  Study: INHALE-3  Description:  A 17-Week Randomized Trial and a 13-Week Extension, Evaluating the Efficacy and Safety of Inhaled Insulin (Afrezza??) Combined with Insulin Degludec Versus Usual Care in Adults with Type 1 Diabetes   [place] IRB#: [id number]  PI: [name], FNP-C  Visit: PV day 4          Patient contacted for phone visit day 4. No changes. No questions or concerns. She is willing to continue participation at this time.    [name], RDN LDN CDCES

## 2022-09-15 ENCOUNTER — Telehealth: Admit: 2022-09-15 | Discharge: 2022-09-16 | Payer: PRIVATE HEALTH INSURANCE | Attending: Clinical | Primary: Clinical

## 2022-09-15 DIAGNOSIS — T7491XS Unspecified adult maltreatment, confirmed, sequela: Principal | ICD-10-CM

## 2022-09-15 DIAGNOSIS — F419 Anxiety disorder, unspecified: Principal | ICD-10-CM

## 2022-09-15 DIAGNOSIS — F32A Depression, unspecified depression type: Principal | ICD-10-CM
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
## 2022-09-16 NOTE — Unmapped (Signed)
[facility] Refill Coordination Note    Specialty Medication(s) to be Shipped:   Transplant: tacrolimus 1mg     Other medication(s) to be shipped:  humalog and pen needles     Sandra Underwood, DOB: [birth year]  Phone: [phone] (home)       All above HIPAA information was verified with patient.     Was a Nurse, learning disability used for this call? No    Completed refill call assessment today to schedule patient's medication shipment from the [facility] [phone]).  All relevant notes have been reviewed.     Specialty medication(s) and dose(s) confirmed: Regimen is correct and unchanged.   Changes to medications: Sandra Underwood reports no changes at this time.  Changes to insurance: No  New side effects reported not previously addressed with a pharmacist or physician: None reported  Questions for the pharmacist: No    Confirmed patient received a Conservation officer, historic buildings and a Surveyor, mining with first shipment. The patient will receive a drug information handout for each medication shipped and additional FDA Medication Guides as required.       DISEASE/MEDICATION-SPECIFIC INFORMATION        N/A    SPECIALTY MEDICATION ADHERENCE     Medication Adherence    Patient reported X missed doses in the last month: 0  Specialty Medication: tacrolimus 1 MG capsule (PROGRAF)  Patient is on additional specialty medications: No  Informant: patient                                Were doses missed due to medication being on hold? No    tacrolimus 1 mg: 10 days of medicine on hand        REFERRAL TO PHARMACIST     Referral to the pharmacist: Not needed      [facility]     Shipping address confirmed in Epic.     Delivery Scheduled: Yes, Expected medication delivery date: 09/23/22.     Medication will be delivered via Same Day Courier to the prescription address in Epic WAM.    [name]   [facility] Specialty Technician

## 2022-09-22 ENCOUNTER — Telehealth: Admit: 2022-09-22 | Discharge: 2022-09-22 | Payer: PRIVATE HEALTH INSURANCE | Attending: Clinical | Primary: Clinical

## 2022-09-22 ENCOUNTER — Ambulatory Visit: Admit: 2022-09-22 | Discharge: 2022-09-22 | Payer: PRIVATE HEALTH INSURANCE

## 2022-09-22 DIAGNOSIS — F32A Depression, unspecified depression type: Principal | ICD-10-CM

## 2022-09-22 DIAGNOSIS — F419 Anxiety disorder, unspecified: Principal | ICD-10-CM

## 2022-09-22 DIAGNOSIS — T7491XD Unspecified adult maltreatment, confirmed, subsequent encounter: Principal | ICD-10-CM
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
## 2022-09-22 NOTE — Unmapped (Signed)
RETURN BEHAVIORAL HEALTH VISIT    45 minutes Duration, Face-to-Face Time    Date of Service:  09/22/2022     Service: Individual therapy with patient, Face-to-Face  Individuals Present: Patient, Therapy Provider      PHQ-9 was not completed during this session. Defer to future session.     Did you complete Depression Screening Review Today (if copying forward your note you must delete and re-enter smartphrase): No    PHQ-9 Score:        PHQ-9 PHQ-9 TOTAL SCORE   09/22/2022  11:00 AM 14   09/15/2022  10:00 AM 20   08/15/2022   1:00 PM 18   06/24/2022  11:00 AM 20   06/17/2022  10:00 AM 19         GAD-7 was not completed during this session. Defer to future session.     GAD7 Total Score GAD-7 Total Score   09/22/2022  11:00 AM 7   09/15/2022  10:00 AM 12   08/15/2022   1:00 PM 14   06/24/2022  11:00 AM 15           Goals:  PT will work on managing anxiety and trauma sx's by decreasing intrusive thoughts/worries from several times per week to once per week or less over the next 6-12 sessions  Pt manage depressive sx's by decreasing self-critical thoughts from several times per week to once per week or less over the next 6-12 sessions       Intervention:  LCSW and pt will explore coping strategies and relationship patterns.  LCSW will provide interactive feedback, supportive reflection and psychoeducation  LCSW will provide suggestions for skill building using CBT and solution focused techniques to manage pt's anxiety, depression and self esteem development      Effectiveness:  Pt reports that since last session she broke up with her bf and has been working to detach from her.  Explore feelings of anger and sadness.  Reports she has a good support system who are encouraging her to not be in contact with her ex.          Plan: Pt says she wants to discuss self esteem and patterns she's noticed in herself in previous romantic relps during the next few sessions.         Safety Screening    Suicide Risk Factors   EMB Suicide Risk: A suicide risk assessment was performed during this evaluation. This patient is not deemed to be at current risk for suicide.   Denies SI    Violence Risk Factors  A violence risk assessment was performed during this assessment. This patient was not deemed to be at elevated risk for violence.  Violence Risk: Denies HI    Mitigating Factors  These risk factors are mitigated by the following factors:  No known access to weapons, No history of previous suicide attempts, No history of violence, Motivation for treatment, Utilization of positive coping skills, Supportive family, Presence of any significant relationship, Presence of an available support system, Employment, Functioning in a Veterinary surgeon setting, Hobbies, Effective problem solving skills, Safe housing, and Stable housing    [employer] Needs  Patient was given local [facility] contact information and a brief explanation of the [facility] system.       Emergency Resources  L[place]/[facility] county specific crisis resources have been added to the AVS when necessary    Additional Resources Available 24 Hours a Day:  National Suicide Prevention Hotline 315-861-4910  [facility] [place]  346-841-0493    If patient doesn't engage in counseling here and/or local supports are indicated, please give patient information on linkage to local services, 24 hour crisis, mobile crisis supports, and their local crisis assessment center.    Number of behavioral health visits in the last 18 months : 0    If patient has UnitedHealthcare or UMR (if applicable), billing was switched to Optum: N/A        PCP: Amie Portland, FNP  Address on File: [address], [place] [address], [facility] [place]      Verified as Current Location: Yes  Extended Emergency Contact Information  Primary Emergency Contact: [facility]  Address: [address]           [place], [place] [address] Darden Amber of [place] Phone: [phone]  Relation: Mother  Secondary Emergency Contact: Herbert,Christopher  Address: [address]           [place], [place] [address] Darden Amber of [place]  Home Phone: [phone]  Mobile Phone: [phone]  Relation: Father     Verified Behavioral Health Emergency Contact: Primary      The patient reports they are physically located in [place] and is currently: at home. I conducted a audio/video visit. I spent  57m 22s on the video call with the patient. I spent an additional 15 minutes on pre- and post-visit activities on the date of service .           ----------------  Therapy Session Provided by Behavioral Health Staff:  Lavone Orn, LCSW

## 2022-09-22 NOTE — Unmapped (Signed)
Thank you for attending your visit today.  Please contact me if needed before our next session        Jacquelynn Cree, DSW, LCSW, LCAS - Care Manager   Virtual Embedded Care Management    P: [phone]

## 2022-09-23 MED FILL — TACROLIMUS 1 MG CAPSULE, IMMEDIATE-RELEASE: ORAL | 30 days supply | Qty: 60 | Fill #1

## 2022-09-23 MED FILL — HUMALOG KWIKPEN (U-100) INSULIN 100 UNIT/ML SUBCUTANEOUS: SUBCUTANEOUS | 30 days supply | Qty: 15 | Fill #4

## 2022-09-23 MED FILL — BD ULTRA-FINE NANO PEN NEEDLE 32 GAUGE X 5/32" (4 MM): 25 days supply | Qty: 200 | Fill #0

## 2022-09-23 NOTE — Unmapped (Addendum)
[facility] Clinical Research Unit Appointment Note  Study: INHALE-3  Description:  A 17-Week Randomized Trial and a 13-Week Extension, Evaluating the Efficacy and Safety of Inhaled Insulin (Afrezza??) Combined with Insulin Degludec Versus Usual Care in Adults with Type 1 Diabetes   [place] IRB#: [id number]  PI: [name], FNP-C  Visit: PV day 8 and15          Patient contacted for PV day 8 on 13Nov2023. No questions or concerns, she is willing to continue participation.    Called patient for day 15 PV. No questions or concerns. No changes. She is willing to continue participation at this time.      [name], RDN LDN CDCES

## 2022-09-30 MED ORDER — LEVOTHYROXINE 25 MCG TABLET
ORAL_TABLET | Freq: Every day | ORAL | 3 refills | 90 days
Start: 2022-09-30 — End: 2023-09-30

## 2022-10-01 NOTE — Unmapped (Signed)
[facility] Clinical Research Unit Appointment Note  Study: INHALE-3  Description:  A 17-Week Randomized Trial and a 13-Week Extension, Evaluating the Efficacy and Safety of Inhaled Insulin (Afrezza??) Combined with Insulin Degludec Versus Usual Care in Adults with Type 1 Diabetes   [place] IRB#: [id number]  PI: [name], FNP-C  Visit: PV day 22          Patient contacted for PV day 22 on 28Nov2023. No questions or concerns. She is willing to continue participation at this time.    [name], RDN LDN CDCES

## 2022-10-02 MED ORDER — LEVOTHYROXINE 25 MCG TABLET
ORAL_TABLET | Freq: Every day | ORAL | 3 refills | 90 days | Status: CP
Start: 2022-10-02 — End: 2023-10-02
  Filled 2022-10-13: qty 90, 90d supply, fill #0

## 2022-10-03 ENCOUNTER — Telehealth: Admit: 2022-10-03 | Discharge: 2022-10-03 | Attending: Clinical | Primary: Clinical

## 2022-10-03 ENCOUNTER — Ambulatory Visit: Admit: 2022-10-03 | Discharge: 2022-10-03
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
## 2022-10-13 MED FILL — LANTUS SOLOSTAR U-100 INSULIN 100 UNIT/ML (3 ML) SUBCUTANEOUS PEN: 90 days supply | Qty: 45 | Fill #3

## 2022-10-13 MED FILL — CETIRIZINE 10 MG TABLET: ORAL | 30 days supply | Qty: 120 | Fill #1

## 2022-10-13 MED FILL — BD ULTRA-FINE NANO PEN NEEDLE 32 GAUGE X 5/32" (4 MM): 25 days supply | Qty: 100 | Fill #5

## 2022-10-13 NOTE — Unmapped (Signed)
Thank you for attending your visit today.  Please contact me if needed before our next session        Ladye Macnaughton, DSW, LCSW, LCAS - Care Manager   Virtual Embedded Care Management    P: [phone]
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
## 2022-10-17 NOTE — Unmapped (Signed)
LCSW called pt when she didn't arrive for scheduled appt.  Pt asked to r/s bc something came up.  Appt r/s as she requested for 12/22 at 2pm.

## 2022-10-17 NOTE — Unmapped (Signed)
[facility] Appointment Note  Study: INHALE-3  Description:  A 17-Week Randomized Trial and a 13-Week Extension, Evaluating the Efficacy and Safety of Inhaled Insulin (Afrezza??) Combined with Insulin Degludec Versus Usual Care in Adults with Type 1 Diabetes   [facility] IRB#: [id number]  PI: [name], FNP-C  Visit: unscheduled          Contacted patient to remind to change dexcom g6 blinded sensor and to mail back to site.      [name], RDN LDN CDCES

## 2022-10-21 MED FILL — TACROLIMUS 1 MG CAPSULE, IMMEDIATE-RELEASE: ORAL | 30 days supply | Qty: 60 | Fill #2

## 2022-10-21 MED FILL — HUMALOG KWIKPEN (U-100) INSULIN 100 UNIT/ML SUBCUTANEOUS: SUBCUTANEOUS | 30 days supply | Qty: 15 | Fill #5

## 2022-10-24 NOTE — Unmapped (Signed)
Patient did not show for today's behavioral health appointment. LCSW sent mssg to pt in MyChart asking if she's interested in rescheduling. Treatment is now discontinued until patient returns call.

## 2022-11-18 MED FILL — ULTICARE PEN NEEDLE 32 GAUGE X 5/32" (4 MM): 25 days supply | Qty: 100 | Fill #6

## 2022-11-18 MED FILL — CETIRIZINE 10 MG TABLET: ORAL | 30 days supply | Qty: 120 | Fill #2

## 2022-11-20 NOTE — Unmapped (Signed)
[facility] Refill Coordination Note    Specialty Medication(s) to be Shipped:   CF/Pulmonary/Asthma: tacrolimus 1mg      Other medication(s) to be shipped:  Humalog Sandra Underwood, DOB: 29-Oct-2001  Phone: [phone] (home)       All above HIPAA information was verified with patient.     Was a Nurse, learning disability used for this call? No    Completed refill call assessment today to schedule patient's medication shipment from the [facility] [phone]).  All relevant notes have been reviewed.     Specialty medication(s) and dose(s) confirmed: Regimen is correct and unchanged.   Changes to medications: Mileydi reports no changes at this time.  Changes to insurance: No  New side effects reported not previously addressed with a pharmacist or physician: None reported  Questions for the pharmacist: No    Confirmed patient received a Conservation officer, historic buildings and a Surveyor, mining with first shipment. The patient will receive a drug information handout for each medication shipped and additional FDA Medication Guides as required.       DISEASE/MEDICATION-SPECIFIC INFORMATION        N/A    SPECIALTY MEDICATION ADHERENCE     Medication Adherence    Patient reported X missed doses in the last month: 0  Specialty Medication: tacrolimus 1 MG capsule (PROGRAF)  Patient is on additional specialty medications: No  Patient is on more than two specialty medications: No  Any gaps in refill history greater than 2 weeks in the last 3 months: no  Demonstrates understanding of importance of adherence: yes  Informant: patient  Reliability of informant: reliable  Provider-estimated medication adherence level: good  Patient is at risk for Non-Adherence: No  Reasons for non-adherence: no problems identified                                Were doses missed due to medication being on hold? No    tacrolimus 1 MG capsule (PROGRAF)  : 10 days of medicine on hand         REFERRAL TO PHARMACIST Referral to the pharmacist: Not needed      [facility]     Shipping address confirmed in Epic.     Delivery Scheduled: Yes, Expected medication delivery date: 11/25/22.     Medication will be delivered via Same Day Courier to the prescription address in Epic WAM.    Vaneza Pickart[facility] Specialty Technician

## 2022-11-25 MED FILL — TACROLIMUS 1 MG CAPSULE, IMMEDIATE-RELEASE: ORAL | 30 days supply | Qty: 60 | Fill #3

## 2022-11-25 MED FILL — HUMALOG KWIKPEN (U-100) INSULIN 100 UNIT/ML SUBCUTANEOUS: SUBCUTANEOUS | 30 days supply | Qty: 15 | Fill #6

## 2022-12-18 DIAGNOSIS — F32A Depression, unspecified depression type: Principal | ICD-10-CM

## 2022-12-18 DIAGNOSIS — F419 Anxiety disorder, unspecified: Principal | ICD-10-CM

## 2022-12-18 DIAGNOSIS — N926 Irregular menstruation, unspecified: Principal | ICD-10-CM

## 2022-12-18 MED ORDER — DULOXETINE 60 MG CAPSULE,DELAYED RELEASE
ORAL_CAPSULE | Freq: Every day | ORAL | 1 refills | 90 days
Start: 2022-12-18 — End: 2023-12-18

## 2022-12-18 MED ORDER — NORETHINDRONE ACETATE 1 MG-ETHINYL ESTRADIOL 20 MCG TABLET
ORAL_TABLET | Freq: Every day | ORAL | 1 refills | 84 days
Start: 2022-12-18 — End: 2023-12-18

## 2022-12-19 MED ORDER — CHLORHEXIDINE GLUCONATE 0.12 % MOUTHWASH
1 refills | 0 days
Start: 2022-12-19 — End: ?

## 2022-12-19 MED ORDER — HYDROCODONE 5 MG-ACETAMINOPHEN 325 MG TABLET
ORAL_TABLET | 0 refills | 0 days
Start: 2022-12-19 — End: ?

## 2022-12-19 MED ORDER — CLINDAMYCIN HCL 300 MG CAPSULE
ORAL_CAPSULE | Freq: Four times a day (QID) | ORAL | 0 refills | 7 days
Start: 2022-12-19 — End: ?

## 2022-12-19 NOTE — Unmapped (Signed)
Patient is requesting the following refill  Requested Prescriptions     Pending Prescriptions Disp Refills    DULoxetine (CYMBALTA) 60 MG capsule 90 capsule 1     Sig: Take 1 capsule (60 mg total) by mouth daily.    norethindrone-ethinyl estradiol (JUNEL 1/20, 21,) 1-20 mg-mcg per tablet 84 tablet 1     Sig: Take 1 tablet by mouth daily.       Recent Visits  Date Type Provider Dept   09/22/22 Office Visit Coralee North, Magnus Ivan, FNP [facility] [address] [facility]   08/15/22 Office Visit Clapp, Magnus Ivan, FNP [facility] [address] [facility]   06/24/22 Office Visit Coralee North, Magnus Ivan, FNP [facility] [address] [facility]   06/17/22 Office Visit Clapp, Magnus Ivan, FNP [facility] [address] [facility]   06/02/22 Office Visit Clapp, Magnus Ivan, FNP [facility] [address] [facility]   Showing recent visits within past 365 days with a meds authorizing provider and meeting all other requirements  Future Appointments  Date Type Provider Dept   12/24/22 Appointment Coralee North, Magnus Ivan, FNP [facility] [address] [facility]   Showing future appointments within next 365 days with a meds authorizing provider and meeting all other requirements       Labs: GAD7:   GAD7 Total Score GAD-7 Total Score   09/22/2022  11:00 AM 7      PHQ9:   PHQ-9 PHQ-9 TOTAL SCORE   09/22/2022  11:00 AM 14

## 2022-12-19 NOTE — Unmapped (Signed)
[facility] Refill Coordination Note    Specialty Medication(s) to be Shipped:   Inflammatory Disorders: tacrolimus    Other medication(s) to be shipped:  humalog 100 units/mL, pen needles, cetirizine 10mg , and levothyroxine 25 mcg     Sandra Underwood, DOB: July 22, 2001  Phone: [phone] (home)       All above HIPAA information was verified with patient and patient's family member, mother.     Was a Nurse, learning disability used for this call? No    Completed refill call assessment today to schedule patient's medication shipment from the [facility] [phone]).  All relevant notes have been reviewed.     Specialty medication(s) and dose(s) confirmed: Regimen is correct and unchanged.   Changes to medications: Sandra Underwood reports starting the following medications: clindamycin 300mg  QID x 7 days and chlorhexidine rinse BID x 2 weeks for recent wisdom teeth extraction   Changes to insurance: No  New side effects reported not previously addressed with a pharmacist or physician: None reported  Questions for the pharmacist: No    Confirmed patient received a Conservation officer, historic buildings and a Surveyor, mining with first shipment. The patient will receive a drug information handout for each medication shipped and additional FDA Medication Guides as required.       DISEASE/MEDICATION-SPECIFIC INFORMATION        N/A    SPECIALTY MEDICATION ADHERENCE     Medication Adherence    Patient reported X missed doses in the last month: 0  Specialty Medication: tacrolimus 1 MG capsule (PROGRAF)  Patient is on additional specialty medications: No  Patient is on more than two specialty medications: No  Any gaps in refill history greater than 2 weeks in the last 3 months: no  Demonstrates understanding of importance of adherence: yes  Informant: patient          Were doses missed due to medication being on hold? No    tacrolimus 1 mg: 14 days of medicine on hand     REFERRAL TO PHARMACIST     Referral to the pharmacist: Not needed      [facility]     Shipping address confirmed in Epic.     Patient was notified of new phone menu : No    Delivery Scheduled: Yes, Expected medication delivery date: 12/23/22.     Medication will be delivered via Same Day Courier to the prescription address in Epic WAM.    Oliva Bustard, PharmD   [facility] Specialty Pharmacist

## 2022-12-20 MED ORDER — NORETHINDRONE ACETATE 1 MG-ETHINYL ESTRADIOL 20 MCG TABLET
ORAL_TABLET | Freq: Every day | ORAL | 1 refills | 84 days | Status: CP
Start: 2022-12-20 — End: 2023-12-20

## 2022-12-20 MED ORDER — DULOXETINE 60 MG CAPSULE,DELAYED RELEASE
ORAL_CAPSULE | Freq: Every day | ORAL | 1 refills | 90 days | Status: CP
Start: 2022-12-20 — End: 2023-12-20
  Filled 2023-01-09: qty 90, 90d supply, fill #0

## 2022-12-20 NOTE — Unmapped (Unsigned)
[facility] - T1DM Return Visit    Assessment and Plan:  Sandra Underwood is a 22 y.o. female with PMHx of T1DM and chronic urticaria on tacrolimus who is being seen in follow-up for T1DM.    1. Type 1 diabetes  A1c: HbA1c ***%, previously 6.8% (06/2022). Goal HbA1c <7.0% without hypoglycemia.  Brief DM History: Diagnosed with T1DM at age 53, previously poorly controlled due to anxiety/depression, wanted to be like her peers and not deal with diabetes, but has done extremely well since starting pump therapy in 2021 and feels confident in her diabetes management and incorporation into her lifestyle.  Current Regimen: Lantus 34 units nightly, 1:8 ICR, 1:50>150 ACHS  CGM Review: CGM downloaded and interpreted x *** days. Daily trends reviewed including patterns. Patterns include: MBG ***, SD ***, ***% TIR, ***% low, ***% high, ***% time active. ***  Today: ***    *The patient is currently using the therapeutic CGM as prescribed.  *The patient has diabetes mellitus.  *The patient is insulin-treated with 2 or more daily injections of insulin(MDII).   *The patient has been using a home blood glucose monitor (BGM) and has performed tests 4 times per day or more prior to starting use of a CGM.   *The patient???s insulin treatment regimen requires frequent adjustments by the patient on the basis of BGM and/or therapeutic CGM testing results.   *Within the last six months, I had an in-person visit with the beneficiary to evaluate their diabetes control and determine that the above criteria are met.   *Every six months I will continue to have an in-person visit with the beneficiary to assess adherence to their CGM regimen and diabetes treatment plan.    2. Diabetes-related complications and prevention  - Hypoglycemia: Endorses hypoglycemic awareness around high 50s-low 60s. She has unexpired glucagon.  - Eyes: Last eye exam 04/19/2021 with [facility]. No disease. Due 04/2021. She has an eye exam coming up next month.  - Kidneys: Last urine MA/C negative (02/2022) and GFR >90 (06/2022). Due 02/2023.  - Feet: No symptoms. No concerns on exam. Due 07/2023.  - BP: WNL  - Lipids/ASCVD risk: Last lipids with LDL 69, HDL 54, TChol 147, TG 118 (02/2021).  - Contraception: OCPs    No follow-ups on file.    Patient was discussed with Dr. Dell Ponto.    Rosiland Oz, MD, PhD  PGY-4, Division of Endocrinology  Phone: [phone]  Fax: [phone]    Subjective:  Sandra Underwood is a 22 y.o. female with PMHx of T1DM and chronic urticaria on tacrolimus who is being seen in follow-up for T1DM.    Initial history obtained by Patric Dykes on 03/01/2018: Ms. Straka is a senior in [employer] taking college courses at her local community college in anticipation of a attending [employer] in the Fall of 2019 for business and Social research officer, government. She is interested in becoming a Best boy.     The patient was initially diagnosed with Type 1 diabetes mellitus in November 2014.   History of hospitalizations for DKA: yes x3 in 2018, 2/2 UTI x2 and food poisoning x1.  FH of autoimmune dysfunction: yes - Mom has ulcerative colitis.  Hx of auto-immune co-morbidities: No.      Current symptoms/problems: none  Known diabetic complications: none  Current diabetic medications: Lantus pen 32u @ HS without misses doses, Novolog 1u:8g carbs and 1u:50points >150. Taking Novolog 1-2x/day.      Previous medication trials: metformin for 1  day at diagnosis.     Eye exam current (within one year): yes - Dr. Dede Query, [facility] in 07/2017. No hx of DR. No vision change. 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      Menstrual cycle: Menarche@ 22yo, OBCP @22yo , taking Junel 21d w/o placebo week per GYN. Has menses once every 6-9 months.      Current monitoring regimen: Freestyle Libre with receiver on her phone  Blood glucose meter to visit: phone  Download: Not able to download from phone at this time. Phone reviewed manually. Checking 1-2x/day. FBG 111-160mg /dl.      Any episodes of severe hypoglycemia (requiring the assistance of others)? no  Hypoglycemic awareness: 70mg /dl     ACE inhibitor or angiotensin II receptor blocker? no  Statin? no  ASA? no  Weight trend: stable  Current diet: high carb 30g -B, 90g - L, 150g - D.   Exercise: no. Barriers to exercise: none    TODAY (12/19/22): ***    She works in Educational psychologist for [employer],     Past Medical History:   Diagnosis Date    Allergic conjunctivitis     Allergic rhinitis     Anxiety     Arthritis     ankles and elbows    Depression     DKA (diabetic ketoacidoses)     DM (diabetes mellitus), type 1 (CMS-HCC)     Food allergy     lemon    Type 1 Duane's syndrome      Past Surgical History:   Procedure Laterality Date    TONSILLECTOMY AND ADENOIDECTOMY  10/2015     Family History   Problem Relation Age of Onset    Ulcerative colitis Mother     Asthma Maternal Grandfather     Hypertension Maternal Grandfather     Cataracts Neg Hx     Glaucoma Neg Hx      Social History     Socioeconomic History    Marital status: Single    Highest education level: Associate degree: occupational, Scientist, product/process development, or vocational program   Tobacco Use    Smoking status: Never     Passive exposure: Never    Smokeless tobacco: Never    Tobacco comments:     once every 2 months   Vaping Use    Vaping status: Never Used   Substance and Sexual Activity    Alcohol use: Yes     Alcohol/week: 2.0 standard drinks of alcohol     Types: 2 Standard drinks or equivalent per week     Comment: hard liquor once a month- social    Drug use: Not Currently     Comment: previous use of MDMA    Sexual activity: Yes     Partners: Male     Birth control/protection: OCP, Condom     Social Determinants of Health     Financial Resource Strain: Medium Risk (09/15/2022)    Overall Financial Resource Strain (CARDIA)     Difficulty of Paying Living Expenses: Somewhat hard   Food Insecurity: No Food Insecurity (09/15/2022)    Hunger Vital Sign     Worried About Running Out of Food in the Last Year: Never true     Ran Out of Food in the Last Year: Never true   Transportation Needs: No Transportation Needs (09/15/2022)    PRAPARE - Therapist, art (Medical): No     Lack of Transportation (Non-Medical):  No   Stress: Stress Concern Present (09/15/2022)    [employer] of Occupational Health - Occupational Stress Questionnaire     Feeling of Stress : Very much   Social Connections: Moderately Integrated (09/15/2022)    Social Connection and Isolation Panel [NHANES]     Frequency of Communication with Friends and Family: More than three times a week     Frequency of Social Gatherings with Friends and Family: Twice a week     Attends Religious Services: More than 4 times per year     Active Member of [employer] or Organizations: Yes     Attends Banker Meetings: Never     Marital Status: Never married     Allergies   Allergen Reactions    Amoxicillin-Pot Clavulanate Rash    Grass Pollen Hives    Ibuprofen Swelling    Lemon Hives    Malt Extract Hives    Metronidazole Hives    Peanut Hives    Tree And Shrub Pollen Hives    Amoxicillin Rash    Penicillins Rash     Current Outpatient Medications   Medication Instructions    blood sugar diagnostic Strp Use to check blood sugar four (4) times a day (before meals and nightly).    blood-glucose sensor (DEXCOM G6 SENSOR) Devi 1 each, Miscellaneous, Every 10 days    blood-glucose transmitter (DEXCOM G6 TRANSMITTER) Devi 1 each, Miscellaneous, Every 3 months    cetirizine (ZYRTEC) 10 MG tablet Take 1-2 tablets (10-20 mg total) by mouth two (2) times a day for hives.    DULoxetine (CYMBALTA) 60 mg, Oral, Daily (standard)    insulin glargine (LANTUS SOLOSTAR U-100 INSULIN) 100 unit/mL (3 mL) injection pen Use up to 50 units per day as per MD instructions, backup for pump failure.    insulin lispro (HUMALOG KWIKPEN INSULIN) 100 unit/mL injection pen Use up to 50 units per day, as per MD instructions.    insulin lispro (HUMALOG U-100 INSULIN) 100 unit/mL injection Use up to 100 units/day, divided into 3 times daily before meals as per MD instructions    insulin pump cart,auto,BT-cntr (OMNIPOD 5 G6 INTRO KIT, GEN 5,) Crtg Started kit includes controller and 11 pods.  Change pods every 3 days or as instructed by your health care professional.    insulin pump cart,automated,BT (OMNIPOD 5 G6 PODS, GEN 5,) Crtg Pods to be changed every 3 days or as instructed by your health care professional.    lancets (ONETOUCH DELICA LANCETS) 33 gauge Misc Use to check bloof sugar 6 times daily    levothyroxine (SYNTHROID) 25 mcg, Oral, Daily (standard)    montelukast (SINGULAIR) 10 mg, Oral, Nightly    norethindrone-ethinyl estradiol (JUNEL 1/20, 21,) 1-20 mg-mcg per tablet 1 tablet, Oral, Daily (standard)    pen needle, diabetic (BD ULTRA-FINE NANO PEN NEEDLE) 32 gauge x 5/32 (4 mm) Ndle Use as directed 4 times daily.    pen needle, diabetic (BD ULTRA-FINE NANO PEN NEEDLE) 32 gauge x 5/32 (4 mm) Ndle Use to administer insulin 6-8x/day    syringe with needle 1 mL 28 gauge x 1/2 Syrg Use every 7 (seven) days.    tacrolimus (PROGRAF) 1 mg, Oral, 2 times a day       Objective:  There were no vitals filed for this visit.    Wt Readings from Last 6 Encounters:   09/22/22 73 kg (161 lb)   08/15/22 70.8 kg (156 lb)   08/08/22 68 kg (150 lb)  07/21/22 69.1 kg (152 lb 6.4 oz)   06/24/22 70.8 kg (156 lb)   06/17/22 69.4 kg (153 lb)     Physical Exam  Data Review: I personally reviewed all available, pertinent labs, imaging, and outside records.    Pump Settings:  N/A    CGM Data:      Portions of this record may have been created using Scientist, clinical (histocompatibility and immunogenetics). Dictation errors have been sought but may not have all been identified and corrected.

## 2022-12-22 ENCOUNTER — Ambulatory Visit
Admit: 2022-12-22 | Payer: PRIVATE HEALTH INSURANCE | Attending: Student in an Organized Health Care Education/Training Program | Primary: Student in an Organized Health Care Education/Training Program

## 2022-12-22 NOTE — Unmapped (Unsigned)
Meter and pump downloaded. POC glucose and A1C done today. PP ***. *** mg/dL.

## 2022-12-23 MED FILL — CETIRIZINE 10 MG TABLET: ORAL | 30 days supply | Qty: 120 | Fill #3

## 2022-12-23 MED FILL — ULTICARE PEN NEEDLE 32 GAUGE X 5/32" (4 MM): 25 days supply | Qty: 100 | Fill #7

## 2022-12-23 MED FILL — LEVOTHYROXINE 25 MCG TABLET: ORAL | 90 days supply | Qty: 90 | Fill #1

## 2022-12-23 MED FILL — HUMALOG KWIKPEN (U-100) INSULIN 100 UNIT/ML SUBCUTANEOUS: SUBCUTANEOUS | 30 days supply | Qty: 15 | Fill #7

## 2022-12-23 MED FILL — TACROLIMUS 1 MG CAPSULE, IMMEDIATE-RELEASE: ORAL | 30 days supply | Qty: 60 | Fill #4

## 2022-12-24 ENCOUNTER — Ambulatory Visit: Admit: 2022-12-24 | Discharge: 2022-12-25 | Payer: PRIVATE HEALTH INSURANCE

## 2022-12-24 DIAGNOSIS — E1065 Type 1 diabetes mellitus with hyperglycemia: Principal | ICD-10-CM

## 2022-12-24 DIAGNOSIS — F32A Depression, unspecified depression type: Principal | ICD-10-CM

## 2022-12-24 DIAGNOSIS — E039 Hypothyroidism, unspecified: Principal | ICD-10-CM

## 2022-12-24 DIAGNOSIS — F419 Anxiety disorder, unspecified: Principal | ICD-10-CM

## 2022-12-24 LAB — HEMOGLOBIN A1C
ESTIMATED AVERAGE GLUCOSE: 163 mg/dL
HEMOGLOBIN A1C: 7.3 % — ABNORMAL HIGH (ref 4.8–5.6)

## 2022-12-24 LAB — TSH: THYROID STIMULATING HORMONE: 1.761 u[IU]/mL (ref 0.550–4.780)
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
## 2022-12-25 NOTE — Unmapped (Signed)
Good afternoon Terrisa     I have reviewed your labs     Your A1c is more elevated than 6 months ago   I will defer management of insulin to Endo   Please be sure to call and reschedule your appt   Your TSH level is normal     [name]

## 2023-01-02 DIAGNOSIS — Z006 Encounter for examination for normal comparison and control in clinical research program: Principal | ICD-10-CM

## 2023-01-02 MED ORDER — STUDY MKC-TI-193 INSULIN HUMAN INHALATION POWDER 4 UNIT/8 UNIT TITRATION PACK
PACK | 0 refills | 0 days | Status: CP
Start: 2023-01-02 — End: ?

## 2023-01-02 MED ORDER — STUDY MKC-TI-193 INSULIN DEGLUDEC (U-100) 100 UNIT/ML (3 ML) SUBCUTANEOUS PEN
PACK | 0 refills | 0 days | Status: CP
Start: 2023-01-02 — End: ?
  Filled 2023-03-11: qty 15, 30d supply, fill #8

## 2023-01-02 MED ORDER — STUDY MKC-TI-193 INSULIN HUMAN INHALATION POWDER 12 UNIT CARTRIDGE
PACK | 0 refills | 0 days | Status: CP
Start: 2023-01-02 — End: ?

## 2023-01-05 ENCOUNTER — Institutional Professional Consult (permissible substitution): Admit: 2023-01-05 | Discharge: 2023-01-06

## 2023-01-05 DIAGNOSIS — Z006 Encounter for examination for normal comparison and control in clinical research program: Principal | ICD-10-CM

## 2023-01-05 NOTE — Unmapped (Signed)
[facility] Appointment Note  Study: INHALE-3  Description:  A 17-Week Randomized Trial and a 13-Week Extension, Evaluating the Efficacy and Safety of Inhaled Insulin (Afrezza??) Combined with Insulin Degludec Versus Usual Care in Adults with Type 1 Diabetes   [place] IRB#: [id number]  PI: [name], FNP-C  Visit: Week 17          Patient arrived for Week 17/Extension visit. Fasting status assessed. Adverse events reviewed-- patient had wisdom tooth removal on 16Feb2024. 3 meds prescribed: clorhexidine, clindamycin, and hydrocodone. Patient used tylenol extra strength when she ran out of the hydrocodone. Pregnancy test completed-- negative result. Physical exam completed by Dr. Concepcion Elk. Labs were drawn at 1028 am. Meal challenge completed. Patient transitioned to Afrezza/Degludec. Week 13 extension visit scheduled for June 2024.      Supplies provided:    Afrezza 4/8s (3):  GTIN: [id number]  LOT [id number]  EXP 11/2024    Degludec (3):   Expiration: 30April2025  LOT: [id number]    Afrezza 12s (2):  GTIN: [id number]  LOT [id number]  EXP 09/2024    BluHALE VIS:  SN [id number]  PIN [id number]    Dexcom g6(5):  35J1LC  35J47K  35J1LJ  35J7D9  35J7CN    Dexcom g7 (10):  LOT [id number]  Exp 31Jan2025      [name], RDN LDN CDCES

## 2023-01-09 MED FILL — CHLORHEXIDINE GLUCONATE 0.12 % MOUTHWASH: 14 days supply | Qty: 473 | Fill #0

## 2023-01-09 MED FILL — CLINDAMYCIN HCL 300 MG CAPSULE: ORAL | 7 days supply | Qty: 28 | Fill #0

## 2023-01-21 ENCOUNTER — Institutional Professional Consult (permissible substitution): Admit: 2023-01-21 | Discharge: 2023-01-22 | Payer: PRIVATE HEALTH INSURANCE

## 2023-01-21 DIAGNOSIS — E1065 Type 1 diabetes mellitus with hyperglycemia: Principal | ICD-10-CM

## 2023-01-21 NOTE — Unmapped (Signed)
Time in/out:   1:00/2:00  Total time:   60 minutes    Assessment/Plan:   Uncontrolled Diabetes, Type 1   Sandra Underwood is participating in the Afrezza insulin study.  She is taking [place] 34 units and Afrezza with meals (see dosing below).  Glycemic control continues to be variable, with TIR 31% and 67% high or very high, 2% low or very low.  The trial ends in June.  Her plan is to move to the Omnipod 5 at that time, integrated with Dexcom G6.  Continue current regimen per trial.  Discussed strategies to improve glucose control.  Encouraged her to seek healthy choices, more vegetables, higher fiber options.  Encouraged to combine protein with her carbs.  All of this will likely reduce spikes and improve stability.  Encouraged to exercise regularly.  Consider taking a walk when glucose levels are elevated.    4.   When nearing the end of the trial, Logyn plans to contact the office for Omnipod 5 RX.    5.  Will schedule follow up with Dr. Silvestre Moment in 1-3 months per pt request (she is overdue for MD visit).  Encouraged to reach out with any questions or concerns.      PCP: Amie Portland, FNP    CC: Follow up Type 1 Diabetes        Subjective:      Sandra Underwood is a 22 y.o. female who presents for f/u of Type 1 diabetes mellitus.  Last seen by me 08/20/2022.    Diagnosed with diabetes age 73 years    Meter downloaded and reviewed. Dexcom G7               Current Diabetes Medications     Tresiba 34 units at bedtime  Afrezza - a 4 cartridge is 1 unit - Total carbs/8 = dose x 2 and then round up to nearest multiple of 4.    Diet:   Fasting in the morning and then first meal is 10:30 (snack)  This will be fruit or poptart  Lunch:  chick-fil-a  Dinner last night was chicken pasta (alfredo)  Snacks:  goldfish, cheese and crackers  No sweet drinks    Exercise: walking her dog (Jax - husky [place] shepherd mix)    DM Related Complications:  none    Eye exam last performed: 04/19/2021 (no evidence of DR)    Social History: Medical coding [place], lives alone with her dog.  She is now in a healthy relationship with a new boyfriend.  He is an Art gallery manager, retired Hotel manager and in the reserves.  He has gotten her to go back to church which she has found beneficial. Sister nearby in [place].  Parents are supportive       Objective:        LMP 12/24/2021         Past Medical History:   Diagnosis Date    Allergic conjunctivitis     Allergic rhinitis     Anxiety     Arthritis     ankles and elbows    Depression     Disease of thyroid gland     DKA (diabetic ketoacidoses)     DM (diabetes mellitus), type 1 (CMS-HCC)     Food allergy     lemon    Type 1 Duane's syndrome          Current Outpatient Medications:     acetaminophen (TYLENOL) 500 MG tablet, Take by mouth.,  Disp: , Rfl:     blood sugar diagnostic Strp, Use to check blood sugar four (4) times a day (before meals and nightly)., Disp: 300 each, Rfl: 12    blood-glucose sensor (DEXCOM G6 SENSOR) Devi, 1 each by Miscellaneous route every ten (10) days., Disp: 9 each, Rfl: 2    blood-glucose transmitter (DEXCOM G6 TRANSMITTER) Devi, 1 each by Miscellaneous route Every three (3) months., Disp: 1 each, Rfl: 3    cetirizine (ZYRTEC) 10 MG tablet, Take 1-2 tablets (10-20 mg total) by mouth two (2) times a day for hives., Disp: 120 tablet, Rfl: 5    chlorhexidine (PERIDEX) 0.12 % solution, Rinse 5-73ml by mouth twice daily for two weeks, Disp: 473 mL, Rfl: 1    clindamycin (CLEOCIN) 300 MG capsule, Take 1 capsule (300 mg total) by mouth every six (6) hours., Disp: , Rfl:     clindamycin (CLEOCIN) 300 MG capsule, Take 1 capsule (300 mg total) by mouth four (4) times a day., Disp: 28 capsule, Rfl: 0    DULoxetine (CYMBALTA) 60 MG capsule, Take 1 capsule (60 mg total) by mouth daily., Disp: 90 capsule, Rfl: 1    HYDROcodone-acetaminophen (NORCO) 5-325 mg per tablet, Take one tablet by mouth every 4-6 hours as needed for pain., Disp: 5 tablet, Rfl: 0    insulin glargine (LANTUS SOLOSTAR U-100 INSULIN) 100 unit/mL (3 mL) injection pen, Use up to 50 units per day as per MD instructions, backup for pump failure. (Patient taking differently: 0.34 mL (34 Units total). Use up to 50 units per day as per MD instructions, backup for pump failure.), Disp: 45 mL, Rfl: 12    insulin lispro (HUMALOG KWIKPEN INSULIN) 100 unit/mL injection pen, Use up to 50 units per day, as per MD instructions., Disp: 15 mL, Rfl: 12    insulin lispro (HUMALOG U-100 INSULIN) 100 unit/mL injection, Use up to 100 units/day, divided into 3 times daily before meals as per MD instructions, Disp: 90 mL, Rfl: 12    insulin pump cart,auto,BT-cntr (OMNIPOD 5 G6 INTRO KIT, GEN 5,) Crtg, Started kit includes controller and 11 pods.  Change pods every 3 days or as instructed by your health care professional., Disp: 1 each, Rfl: 0    insulin pump cart,automated,BT (OMNIPOD 5 G6 PODS, GEN 5,) Crtg, Pods to be changed every 3 days or as instructed by your health care professional., Disp: 10 each, Rfl: 11    lancets (ONETOUCH DELICA LANCETS) 33 gauge Misc, Use to check bloof sugar 6 times daily, Disp: 200 each, Rfl: 3    levothyroxine (SYNTHROID) 25 MCG tablet, Take 1 tablet (25 mcg total) by mouth daily., Disp: 90 tablet, Rfl: 3    norethindrone-ethinyl estradiol (JUNEL 1/20, 21,) 1-20 mg-mcg per tablet, Take 1 tablet by mouth daily., Disp: 84 tablet, Rfl: 1    pen needle, diabetic (BD ULTRA-FINE NANO PEN NEEDLE) 32 gauge x 5/32 (4 mm) Ndle, Use as directed 4 times daily., Disp: 100 each, Rfl: 12    pen needle, diabetic (BD ULTRA-FINE NANO PEN NEEDLE) 32 gauge x 5/32 (4 mm) Ndle, Use to administer insulin 6-8x/day, Disp: 200 each, Rfl: 3    STUDY MKC-TI-193 insulin degludec U-100 100 unit/mL (3 mL) injection pen, Inject subcutaneously in the thigh, upper arm, or abdomen as directed by study team.  Target fasting glucose: 90 - 120 mg/dL., Disp: 3 kit, Rfl: 0    STUDY MKC-TI-193 Insulin Human Inhalation Powder 12 unit cartridge, Inhale dose prior to each meal as  instructed by study team.  Inhale correction dosing as instructed by study team.  Open blistercards must be used within 3 days., Disp: 2 kit, Rfl: 0    STUDY MKC-TI-193 Insulin Human Inhalation Powder 4 Unit/8 Unit titration pack, Inhale dose prior to each meal as instructed by study team.  Inhale correction dosing as instructed by study team.  Open blistercards must be used within 3 days., Disp: 3 kit, Rfl: 0    syringe with needle 1 mL 28 gauge x 1/2 Syrg, Use every 7 (seven) days., Disp: , Rfl:     tacrolimus (PROGRAF) 1 MG capsule, Take 1 capsule (1 mg total) by mouth two (2) times a day., Disp: 60 capsule, Rfl: 5    Allergies   Allergen Reactions    Amoxicillin-Pot Clavulanate Rash    Grass Pollen Hives    Ibuprofen Swelling    Lemon Hives    Malt Extract Hives    Metronidazole Hives    Peanut Hives    Tree And Shrub Pollen Hives    Amoxicillin Rash    Penicillins Rash       Family History   Problem Relation Age of Onset    Ulcerative colitis Mother     Asthma Maternal Grandfather     Hypertension Maternal Grandfather     Cancer Maternal Grandmother         Lung Cancer    Diabetes Paternal Grandmother         Type 2    Cataracts Neg Hx     Glaucoma Neg Hx          Lab Results   Component Value Date    A1C 7.3 (H) 12/24/2022    A1C 6.8 (H) 06/17/2022    A1C 6.5 01/21/2022    A1C 6.5 10/23/2021       Wt Readings from Last 3 Encounters:   12/24/22 75.8 kg (167 lb)   09/22/22 73 kg (161 lb)   08/15/22 70.8 kg (156 lb)       Lab Review    Lab Results   Component Value Date    NA 138 06/17/2022    K 3.9 06/17/2022    CL 105 06/17/2022    CO2 26.0 06/17/2022    BUN 10 06/17/2022    CREATININE 0.80 06/17/2022    GFRNONAA >=60 03/29/2018    GFRAA >=60 03/29/2018    CALCIUM 9.4 06/17/2022    ALBUMIN 3.9 06/17/2022    PHOS 4.1 05/17/2020       Lab Results   Component Value Date    CHOL 162 06/17/2022     Lab Results   Component Value Date    LDL 71 06/17/2022     Lab Results   Component Value Date    HDL 59 06/17/2022     Lab Results   Component Value Date    TRIG 158 (H) 06/17/2022     Lab Results   Component Value Date    ALT 16 06/17/2022     Lab Results   Component Value Date    TSH 1.761 12/24/2022     Lab Results   Component Value Date    CREATUR 300.6 03/12/2022     Lab Results   Component Value Date    Albumin Quantitative, Urine 1.1 03/12/2022    Albumin/Creatinine Ratio 3.7 03/12/2022     No results found for: LABCREA, MICROALBQTUR, MSHCGMOM, MALBCRERAT  No results found for: PROTEINUR, LABCREA, PCRATIOUR  Alexia Freestone, MSN, AGCNS-BC, APRN, CDCES

## 2023-01-22 NOTE — Unmapped (Signed)
[facility] Refill Coordination Note    Specialty Medication(s) to be Shipped:   CF/Pulmonary/Asthma: tacrolimus 1    Other medication(s) to be shipped:  cetirizine 101mg      Sandra Underwood, DOB: 2001-01-11  Phone: [phone] (home)       All above HIPAA information was verified with patient.     Was a Nurse, learning disability used for this call? No    Completed refill call assessment today to schedule patient's medication shipment from the [facility] [phone]).  All relevant notes have been reviewed.     Specialty medication(s) and dose(s) confirmed: Regimen is correct and unchanged.   Changes to medications: Sandra Underwood reports no changes at this time.  Changes to insurance: No  New side effects reported not previously addressed with a pharmacist or physician: None reported  Questions for the pharmacist: No    Confirmed patient received a Conservation officer, historic buildings and a Surveyor, mining with first shipment. The patient will receive a drug information handout for each medication shipped and additional FDA Medication Guides as required.       DISEASE/MEDICATION-SPECIFIC INFORMATION        N/A    SPECIALTY MEDICATION ADHERENCE     Medication Adherence    Patient reported X missed doses in the last month: 0  Specialty Medication: tacrolimus 1 MG capsule (PROGRAF)  Patient is on additional specialty medications: No  Patient is on more than two specialty medications: No  Any gaps in refill history greater than 2 weeks in the last 3 months: no  Demonstrates understanding of importance of adherence: yes  Informant: patient  Reliability of informant: reliable  Provider-estimated medication adherence level: good  Patient is at risk for Non-Adherence: No  Reasons for non-adherence: no problems identified              Were doses missed due to medication being on hold? No    tacrolimus 1 MG capsule (PROGRAF)  : 10 days of medicine on hand       REFERRAL TO PHARMACIST     Referral to the pharmacist: Not needed      [facility]     Shipping address confirmed in Epic.     Patient was notified of new phone menu : No    Delivery Scheduled: Yes, Expected medication delivery date: 01/28/23.     Medication will be delivered via UPS to the prescription address in Epic WAM.    Sandra Underwood' W Danae Chen Shared [facility] Specialty Technician

## 2023-01-27 MED FILL — TACROLIMUS 1 MG CAPSULE, IMMEDIATE-RELEASE: ORAL | 30 days supply | Qty: 60 | Fill #5

## 2023-01-27 MED FILL — CETIRIZINE 10 MG TABLET: ORAL | 30 days supply | Qty: 120 | Fill #4

## 2023-01-27 NOTE — Unmapped (Signed)
[facility] [facility] Appointment Note  Study: INHALE-3  Description:  A 17-Week Randomized Trial and a 13-Week Extension, Evaluating the Efficacy and Safety of Inhaled Insulin (Afrezza??) Combined with Insulin Degludec Versus Usual Care in Adults with Type 1 Diabetes   [place] IRB#: [id number]  PI: [name], FNP-C  Visit: Extension Phase contacts          Patient moved forward into extension phase of INHALE-3 study.      Extension day 4 07Mar2024.  Extension day 8 12Mar2024.  Extension day 15 19Mar2024.  Extension day 22 26Mar2024.  Extension day 29 scheduled for 02April2024.    She is willing to continue participation.      [name], RDN LDN CDCES

## 2023-02-09 DIAGNOSIS — N926 Irregular menstruation, unspecified: Principal | ICD-10-CM

## 2023-02-09 MED ORDER — NORETHINDRONE ACETATE 1 MG-ETHINYL ESTRADIOL 20 MCG TABLET
ORAL_TABLET | Freq: Every day | ORAL | 1 refills | 84 days | Status: CP
Start: 2023-02-09 — End: 2024-02-09

## 2023-02-17 MED ORDER — TACROLIMUS 1 MG CAPSULE, IMMEDIATE-RELEASE
ORAL_CAPSULE | Freq: Two times a day (BID) | ORAL | 5 refills | 30 days
Start: 2023-02-17 — End: ?

## 2023-02-19 MED ORDER — TACROLIMUS 1 MG CAPSULE, IMMEDIATE-RELEASE
ORAL_CAPSULE | Freq: Two times a day (BID) | ORAL | 5 refills | 30 days
Start: 2023-02-19 — End: ?

## 2023-02-27 NOTE — Unmapped (Signed)
[place] Assessment of Medications Program (CAMP)  RECRUITMENT SUMMARY NOTE   Network Health Plan (NHP) Diabetes       CAMP is a team of pharmacists and pharmacy technicians within [facility] that supports patients and providers.     Patient was identified for CAMP services based on criteria including, 23 years of age or older, with a recent managing provider visit, and   A1c >= 7% (NHP)     A letter has been sent explaining program services.        Dessire Grimes, CPhT   Certified Pharmacy Technician   [place] Assessment of Medications Program (CAMP)   P [phone], F [phone]

## 2023-02-27 NOTE — Unmapped (Signed)
[facility] Refill Coordination Note    Specialty Medication(s) to be Shipped:   CF/Pulmonary/Asthma: tacrolimus 1 MG capsule (PROGRAF)    Other medication(s) to be shipped:  Ulticare Pen needle, cetirizine, Humalog Juliaette Agate, DOB: 2000/12/17  Phone: [phone] (home)       All above HIPAA information was verified with patient.     Was a Nurse, learning disability used for this call? No    Completed refill call assessment today to schedule patient's medication shipment from the [facility] [phone]).  All relevant notes have been reviewed.     Specialty medication(s) and dose(s) confirmed: Regimen is correct and unchanged.   Changes to medications: Denece reports no changes at this time.  Changes to insurance: No  New side effects reported not previously addressed with a pharmacist or physician: None reported  Questions for the pharmacist: No    Confirmed patient received a Conservation officer, historic buildings and a Surveyor, mining with first shipment. The patient will receive a drug information handout for each medication shipped and additional FDA Medication Guides as required.       DISEASE/MEDICATION-SPECIFIC INFORMATION        For CF patients: CF [facility] Active? No-not enrolled    SPECIALTY MEDICATION ADHERENCE     Medication Adherence    Patient reported X missed doses in the last month: 0  Specialty Medication: tacrolimus 1 MG capsule (PROGRAF)  Patient is on additional specialty medications: No  Patient is on more than two specialty medications: No  Any gaps in refill history greater than 2 weeks in the last 3 months: no  Demonstrates understanding of importance of adherence: yes  Informant: patient  Reliability of informant: reliable  Provider-estimated medication adherence level: good  Patient is at risk for Non-Adherence: No  Reasons for non-adherence: no problems identified              Were doses missed due to medication being on hold? No    tacrolimus 1 MG capsule (PROGRAF)   : 14 days of medicine on hand       REFERRAL TO PHARMACIST     Referral to the pharmacist: Not needed      [facility]     Shipping address confirmed in Epic.       Delivery Scheduled: Yes, Expected medication delivery date: 03/03/23.     Medication will be delivered via Same Day Courier to the prescription address in Epic WAM.    Joleena Weisenburger' W Danae Chen Shared [facility] Specialty Technician

## 2023-03-03 DIAGNOSIS — E1065 Type 1 diabetes mellitus with hyperglycemia: Principal | ICD-10-CM

## 2023-03-03 MED FILL — TACROLIMUS 1 MG CAPSULE, IMMEDIATE-RELEASE: ORAL | 30 days supply | Qty: 60 | Fill #0

## 2023-03-03 MED FILL — CETIRIZINE 10 MG TABLET: ORAL | 30 days supply | Qty: 120 | Fill #5

## 2023-03-03 MED FILL — ULTICARE PEN NEEDLE 32 GAUGE X 5/32" (4 MM): 25 days supply | Qty: 100 | Fill #8

## 2023-03-09 NOTE — Unmapped (Signed)
[place] Assessment of Medications Program (CAMP)  RECRUITMENT SUMMARY NOTE   Network Health Plan (NHP) Diabetes       Patient was outreached for CAMP Services. Unable to contact- left message.        Toney Difatta, CPhT   Certified Pharmacy Technician   [place] Assessment of Medications Program (CAMP)   P [phone], F [phone]

## 2023-03-11 NOTE — Unmapped (Unsigned)
[place] Assessment of Medications Program (CAMP)  RECRUITMENT SUMMARY NOTE   Network Health Plan (NHP) Diabetes       Patient was outreached for [employer]. {CAMP Outreach Outcomes:73163}    7.3    Jerolyn Center, CPhT   Certified Administrator, [employer] of Medications Program (CAMP)   P [phone], F [phone]

## 2023-03-24 ENCOUNTER — Ambulatory Visit: Admit: 2023-03-24 | Discharge: 2023-03-25

## 2023-03-24 NOTE — Unmapped (Signed)
[place] Assessment of Medications Program (CAMP) Clinic      Dear Dr. Silvestre Moment,     I recently spoke with our shared patient and recommendations are as follows. See note 03/24/2023 for rationale supporting recommendation(s).      Action Item: Please respond within 5 business days should you agree with the BELOW medication changes to serve as a verbal order.     Patient's insurance formulary has changed and Lantus is no longer covered. Recommend prescriber continue insulin degludec Evaristo Bury) after study completion, rather than switch to Lantus.     Patient can get copay reductions if medications filled at [facility].  Recommend prescriber send updated prescription for Dexcom to [facility].  May also require PA for insurance.    Should you agree and wish to implement the above medication changes, I can take a verbal order and communicate the changes to the patient and patient's pharmacy. See below for possible orders:    Dexcom G7 sensors.  Use 1 each, every 10 days.  #9, 1R.     Future Appointments   Date Time Provider Department Center   04/09/2023 10:00 AM [facility]   06/16/2023 10:00 AM Philis Fendt, CPP [facility]   06/24/2023  9:00 AM Clapp, Magnus Ivan, FNP [facility]            Duke Salvia, CPP, PharmD   Clinical Pharmacist- [place] Assessment of Medications Program (CAMP)   CAMP Clinic: [phone] - Fax: [phone]

## 2023-03-24 NOTE — Unmapped (Signed)
[place] [facility] ([facility]  Diabetes Targeted - Initial Visit     Name: Sandra Underwood  Date of Birth: 03-03-2001  Today's Date: 03/24/2023  Age: 22 y.o.    To patients reading this note: Please be advised that the primary purpose of this note is for [place] to keep track of your care and communicate with other members of your medical team. These suggested changes are intended to enhance the overall care you receive.  Approved changes will be communicated to you by your care team.     Provider Recommendations    Patient's insurance formulary has changed and Lantus is no longer covered.   Recommend prescriber continue insulin degludec Evaristo Bury) after study completion, rather than switch to Lantus.      Patient can get copay reductions if medications filled at [facility].  Recommend prescriber send updated prescription for Dexcom to [facility].  May also require PA for insurance.       Patient Summary      Diabetes  Patient reported treatment: On study until June 6 - insulin degludec, insulin Human inhalation powder AC/correction; then plan for change to insulin glargine (Lantus/Toujeo) long acting, insulin lispro (Humalog) AC/correction; considering pump in Fall/Winter such as Omnipod 5 or Tandem    Home blood glucose monitoring: Yes. Patient using CGM, Dexcom G7, likes discreetness (obtaining through study)  TIR:  >250 - 27%  180-250 - 27%  70-180 - 44%  Low - 2%  <1% - very low  GMI - 8.1% (14 day)  90 day - 8.1 GMI  Highs late afternoon 3-4 or 5 pm; works 8 to 4:30 and mostly sitting in afternoon  Hypoglycemia: No  Hyperglycemia: Yes. Symptoms: none, from eating and timing of insulin  Diet: working to [employer] intake to insulin and have routine use/timing of insulin.  Has met with variety of diabetes educators; no recent designated dietician  Exercise: walks dog  Of note, patient with active NHP PA for Humalog through 03/02/24; filled per claims 03/11/23  No PA requests/approvals seen for Omnipod or Dexcom as of 03/23/24    Assessment / Plan  Lab Results   Component Value Date    A1C 7.3 (H) 12/24/2022    A1C 6.8 (H) 06/17/2022    A1C 6.5 01/21/2022     Currently uncontrolled. Goal A1c <7% per ADA guidelines.    Specific recommendations to provider noted above  Educated patient on insurance updates with new plan year (while on study) and potential changes/authorizations.    Would consider continuation of Tresiba (instead of switch to Lantus).    Will need Dexcom PA and Omnipod PA prior to coverage. Encouraged to obtain updated prescription and fill prior to running out of supplies.  Educated to ensure correct version of Omnipod 5 is filled to match her preferred Dexcom   Discussed benefits of pump/Omnipod therapy to tailor and adjust insulin doses  Reviewed symptoms and treatment of hypoglycemia  Recommend routine blood glucose monitoring  Counseled regarding lifestyle modifications  Discussed dietitian. Will defer to endocrine office at this time; patient will check on their available resources.   Due for A1c (will likely have at study conclusion in ~2 weeks  _____________________________________________________     Hypertension  Patient reported treatment: none   Hypotension symptoms: No  Hypertension symptoms: No    Assessment / Plan  BP Readings from Last 3 Encounters:   12/24/22 98/60   09/22/22 102/68   08/15/22 98/54  Pulse Readings from Last 3 Encounters:   12/24/22 110   09/22/22 106   08/15/22 90      Lab Results   Component Value Date    BUN 10 06/17/2022    Creatinine 0.80 06/17/2022    Sodium 138 06/17/2022    Sodium Whole Blood 131 (L) 09/16/2017    Potassium 3.9 06/17/2022    Potassium, Bld 4.3 09/16/2017       Currently controlled. Goal blood pressure of 130/80 per 2017 ACC/AHA HTN Guidelines.   Continue monitoring  _____________________________________________________    Medication Management   Medications reviewed with patient & education provided regarding medications and [facility]. Reviewed the indication, dose, frequency, and affordability concerns (if applicable).      Notes and recommendations from today's visit have been sent to the appropriate provider(s)      Duke Salvia, CPP, PharmD  Clinical Pharmacist- [place] [facility] ([facility])  [facility]: [phone] - Fax: [phone]    The patient reports they are physically located in [place] and is currently: at home. I conducted a phone visit.  I spent 30 minutes on the phone call with the patient on the date of service .     Future Appointments   Date Time Provider Department Center   04/09/2023 10:00 AM [facility]   06/16/2023 10:00 AM Shanon Brow Lyn, CPP [facility]   06/24/2023  9:00 AM Clapp, Magnus Ivan, FNP [facility]      Current Medications:     Current Outpatient Medications on File Prior to Visit   Medication Sig Dispense Refill    acetaminophen (TYLENOL) 500 MG tablet Take by mouth.      blood sugar diagnostic Strp Use to check blood sugar four (4) times a day (before meals and nightly). 300 each 12    blood-glucose sensor (DEXCOM G6 SENSOR) Devi 1 each by Miscellaneous route every ten (10) days. 9 each 2    blood-glucose transmitter (DEXCOM G6 TRANSMITTER) Devi 1 each by Miscellaneous route Every three (3) months. 1 each 3    cetirizine (ZYRTEC) 10 MG tablet Take 1-2 tablets (10-20 mg total) by mouth two (2) times a day for hives. 120 tablet 5    chlorhexidine (PERIDEX) 0.12 % solution Rinse 5-45ml by mouth twice daily for two weeks 473 mL 1    clindamycin (CLEOCIN) 300 MG capsule Take 1 capsule (300 mg total) by mouth every six (6) hours.      clindamycin (CLEOCIN) 300 MG capsule Take 1 capsule (300 mg total) by mouth four (4) times a day. 28 capsule 0    DULoxetine (CYMBALTA) 60 MG capsule Take 1 capsule (60 mg total) by mouth daily. 90 capsule 1    HYDROcodone-acetaminophen (NORCO) 5-325 mg per tablet Take one tablet by mouth every 4-6 hours as needed for pain. 5 tablet 0    insulin glargine (LANTUS SOLOSTAR U-100 INSULIN) 100 unit/mL (3 mL) injection pen Use up to 50 units per day as per MD instructions, backup for pump failure. (Patient taking differently: 0.34 mL (34 Units total). Use up to 50 units per day as per MD instructions, backup for pump failure.) 45 mL 12    insulin lispro (HUMALOG KWIKPEN INSULIN) 100 unit/mL injection pen Use up to 50 units per day, as per MD instructions. 15 mL 12    insulin lispro (HUMALOG U-100 INSULIN) 100 unit/mL injection Use up to 100 units/day, divided into 3  times daily before meals as per MD instructions 90 mL 12    insulin pump cart,auto,BT-cntr (OMNIPOD 5 G6 INTRO KIT, GEN 5,) Crtg Started kit includes controller and 11 pods.  Change pods every 3 days or as instructed by your health care professional. 1 each 0    insulin pump cart,automated,BT (OMNIPOD 5 G6 PODS, GEN 5,) Crtg Pods to be changed every 3 days or as instructed by your health care professional. 10 each 11    lancets (ONETOUCH DELICA LANCETS) 33 gauge Misc Use to check bloof sugar 6 times daily 200 each 3    levothyroxine (SYNTHROID) 25 MCG tablet Take 1 tablet (25 mcg total) by mouth daily. 90 tablet 3    norethindrone-ethinyl estradiol (JUNEL 1/20, 21,) 1-20 mg-mcg per tablet Take 1 tablet by mouth daily. 84 tablet 1    pen needle, diabetic (BD ULTRA-FINE NANO PEN NEEDLE) 32 gauge x 5/32 (4 mm) Ndle Use as directed 4 times daily. 100 each 12    pen needle, diabetic (BD ULTRA-FINE NANO PEN NEEDLE) 32 gauge x 5/32 (4 mm) Ndle Use to administer insulin 6-8x/day 200 each 3    STUDY MKC-TI-193 insulin degludec U-100 100 unit/mL (3 mL) injection pen Inject subcutaneously in the thigh, upper arm, or abdomen as directed by study team.  Target fasting glucose: 90 - 120 mg/dL. 3 kit 0    STUDY MKC-TI-193 Insulin Human Inhalation Powder 12 unit cartridge Inhale dose prior to each meal as instructed by study team.  Inhale correction dosing as instructed by study team.  Open blistercards must be used within 3 days. 2 kit 0    STUDY MKC-TI-193 Insulin Human Inhalation Powder 4 Unit/8 Unit titration pack Inhale dose prior to each meal as instructed by study team.  Inhale correction dosing as instructed by study team.  Open blistercards must be used within 3 days. 3 kit 0    syringe with needle 1 mL 28 gauge x 1/2 Syrg Use every 7 (seven) days.      tacrolimus (PROGRAF) 1 MG capsule Take 1 capsule (1 mg total) by mouth two (2) times a day. 60 capsule 5     No current facility-administered medications on file prior to visit.

## 2023-03-25 MED ORDER — DEXCOM G7 SENSOR DEVICE
1 refills | 0 days | Status: CP
Start: 2023-03-25 — End: ?

## 2023-03-25 NOTE — Unmapped (Signed)
Addended by: Duke Salvia on: 03/25/2023 04:09 PM     Modules accepted: Orders

## 2023-03-25 NOTE — Unmapped (Signed)
[place] [facility] ([facility]-- [employer] Plan - Diabetes Targeted    Name: Sandra Underwood  Date of Birth: 25-Jun-2001  MRN: [id number]      Called pt to discuss medication changes approved by Dr. Velda Shell (covering for Dr. Silvestre Moment).      Medication refill as follows:  Dexcom G7 sensors.  Use 1 each, every 10 days.  #9, 1R.     The above adjustments were reviewed with the patient. Education was provided regarding indication, dose, route, frequency, and possible side effects. Product will need requested from pharmacy and may need prior authorization. Pt verbalized understanding. Medication list updated.  Encouraged to discuss insulin at future visit.     Prescription(s) were e-scribed to: [facility]      Sandra Underwood, PharmD, CPP  Clinical Pharmacist, [place] [facility] ([facility])  [facility] Clinic: [phone] - Fax: [phone]

## 2023-04-06 DIAGNOSIS — Z006 Encounter for examination for normal comparison and control in clinical research program: Principal | ICD-10-CM

## 2023-04-06 MED ORDER — INSULIN LISPRO (U-100) 100 UNIT/ML SUBCUTANEOUS PEN
Freq: Every day | SUBCUTANEOUS | 12 refills | 30 days
Start: 2023-04-06 — End: ?

## 2023-04-06 MED ORDER — CETIRIZINE 10 MG TABLET
ORAL_TABLET | Freq: Two times a day (BID) | ORAL | 0 refills | 30 days
Start: 2023-04-06 — End: ?

## 2023-04-06 MED ORDER — STUDY MKC-TI-193 INSULIN HUMAN INHALATION POWDER 4 UNIT/8 UNIT TITRATION PACK
PACK | 0 refills | 0 days | Status: CP
Start: 2023-04-06 — End: ?

## 2023-04-06 NOTE — Unmapped (Signed)
[facility] Refill Coordination Note    Specialty Medication(s) to be Shipped:   Inflammatory Disorders: tacrolimus    Other medication(s) to be shipped:  duloxetine 60mg , levothyroxine , cetirizine 10mg  and humalog 100 units/mL     Sandra Underwood, DOB: 05-21-2001  Phone: [phone] (home)       All above HIPAA information was verified with patient.     Was a Nurse, learning disability used for this call? No    Completed refill call assessment today to schedule patient's medication shipment from the [facility] [phone]).  All relevant notes have been reviewed.     Specialty medication(s) and dose(s) confirmed: Regimen is correct and unchanged.   Changes to medications: Sandra Underwood reports no changes at this time.  Changes to insurance: No  New side effects reported not previously addressed with a pharmacist or physician: None reported  Questions for the pharmacist: No    Confirmed patient received a Conservation officer, historic buildings and a Surveyor, mining with first shipment. The patient will receive a drug information handout for each medication shipped and additional FDA Medication Guides as required.       DISEASE/MEDICATION-SPECIFIC INFORMATION        N/A    SPECIALTY MEDICATION ADHERENCE     Medication Adherence    Patient reported X missed doses in the last month: 0  Specialty Medication: tacrolimus 1 MG capsule (PROGRAF)  Patient is on additional specialty medications: No  Patient is on more than two specialty medications: No  Any gaps in refill history greater than 2 weeks in the last 3 months: no  Demonstrates understanding of importance of adherence: yes  Informant: patient          Were doses missed due to medication being on hold? No    tacrolimus 1 mg: 10 days of medicine on hand     REFERRAL TO PHARMACIST     Referral to the pharmacist: Not needed      [facility]     Shipping address confirmed in Epic.       Delivery Scheduled: Yes, Expected medication delivery date: 04/09/23. Medication will be delivered via Same Day Courier to the prescription address in Epic WAM.    Oliva Bustard, PharmD   [facility] Specialty Pharmacist

## 2023-04-07 MED ORDER — INSULIN LISPRO (U-100) 100 UNIT/ML SUBCUTANEOUS PEN
Freq: Every day | SUBCUTANEOUS | 0 refills | 90 days | Status: CP
Start: 2023-04-07 — End: ?
  Filled 2023-04-09: qty 45, 90d supply, fill #0

## 2023-04-09 MED FILL — CETIRIZINE 10 MG TABLET: ORAL | 30 days supply | Qty: 120 | Fill #0

## 2023-04-09 MED FILL — LEVOTHYROXINE 25 MCG TABLET: ORAL | 90 days supply | Qty: 90 | Fill #2

## 2023-04-09 MED FILL — TACROLIMUS 1 MG CAPSULE, IMMEDIATE-RELEASE: ORAL | 30 days supply | Qty: 60 | Fill #1

## 2023-04-09 MED FILL — DULOXETINE 60 MG CAPSULE,DELAYED RELEASE: ORAL | 90 days supply | Qty: 90 | Fill #1

## 2023-04-09 MED FILL — DEXCOM G7 SENSOR DEVICE: 90 days supply | Qty: 9 | Fill #0

## 2023-04-15 DIAGNOSIS — F32A Depression, unspecified depression type: Principal | ICD-10-CM

## 2023-04-15 DIAGNOSIS — F419 Anxiety disorder, unspecified: Principal | ICD-10-CM

## 2023-04-15 MED ORDER — DULOXETINE 60 MG CAPSULE,DELAYED RELEASE
ORAL_CAPSULE | Freq: Every day | ORAL | 1 refills | 90 days | Status: CP
Start: 2023-04-15 — End: 2024-04-14

## 2023-04-15 NOTE — Unmapped (Signed)
Patient is requesting the following refill  Requested Prescriptions     Pending Prescriptions Disp Refills    DULoxetine (CYMBALTA) 60 MG capsule [Pharmacy Med Name: DULOXETINE HCL DR 60 MG CAP] 90 capsule 1     Sig: Take 1 capsule (60 mg total) by mouth daily.       Recent Visits  Date Type Provider Dept   12/24/22 Office Visit Coralee North, Magnus Ivan, FNP [facility] [address] [facility]   09/22/22 Office Visit Clapp, Magnus Ivan, FNP [facility] [address] [facility]   08/15/22 Office Visit Clapp, Magnus Ivan, FNP [facility] [address] [facility]   06/24/22 Office Visit Clapp, Magnus Ivan, FNP [facility] [address] [facility]   06/17/22 Office Visit Clapp, Magnus Ivan, FNP [facility] [address] At [facility]   06/02/22 Office Visit Clapp, Magnus Ivan, FNP [facility] [address] [facility]   Showing recent visits within past 365 days with a meds authorizing provider and meeting all other requirements  Future Appointments  Date Type Provider Dept   06/24/23 Appointment Coralee North, Magnus Ivan, FNP [facility] [address] At [facility]   Showing future appointments within next 365 days with a meds authorizing provider and meeting all other requirements       Labs: GAD7:   GAD7 Total Score GAD-7 Total Score   12/24/2022  10:00 AM 2      PHQ9:   PHQ-9 PHQ-9 TOTAL SCORE   12/24/2022  10:00 AM 5

## 2023-04-16 ENCOUNTER — Institutional Professional Consult (permissible substitution): Admit: 2023-04-16 | Discharge: 2023-04-17

## 2023-04-16 DIAGNOSIS — E1065 Type 1 diabetes mellitus with hyperglycemia: Principal | ICD-10-CM

## 2023-04-16 NOTE — Unmapped (Signed)
[facility] Appointment Note  Study: INHALE-3  Description:  A 17-Week Randomized Trial and a 13-Week Extension, Evaluating the Efficacy and Safety of Inhaled Insulin (Afrezza??) Combined with Insulin Degludec Versus Usual Care in Adults with Type 1 Diabetes   [facility] IRB#: [id number]  PI: [name], FNP-C  Visit: Extension Week 13 visit          Patient seen for Extension week 13 visit. She will continue to wear the blinded CGM through the end of the day and will mail back to site. Patient will transition back to MDI using Humalog and Lantus. She did not return supplies to site, aside from [place] VIS. [name] FNP-C performed physical exam. Labs were drawn at 1052. Pregnancy test completed- negative result. She has completed the [facility].       [name], RDN LDN CDCES

## 2023-04-29 NOTE — Unmapped (Signed)
[facility] Refill Coordination Note    Specialty Medication(s) to be Shipped:   CF/Pulmonary/Asthma: tacrolimus 1 MG capsule (PROGRAF)    Other medication(s) to be shipped: No additional medications requested for fill at this time     Sandra Underwood, DOB: July 14, 2001  Phone: [phone] (home)       All above HIPAA information was verified with patient.     Was a Nurse, learning disability used for this call? No    Completed refill call assessment today to schedule patient's medication shipment from the [facility] [phone]).  All relevant notes have been reviewed.     Specialty medication(s) and dose(s) confirmed: Regimen is correct and unchanged.   Changes to medications: Sandra Underwood reports no changes at this time.  Changes to insurance: No  New side effects reported not previously addressed with a pharmacist or physician: None reported  Questions for the pharmacist: No    Confirmed patient received a Conservation officer, historic buildings and a Surveyor, mining with first shipment. The patient will receive a drug information handout for each medication shipped and additional FDA Medication Guides as required.       DISEASE/MEDICATION-SPECIFIC INFORMATION        N/A    SPECIALTY MEDICATION ADHERENCE     Medication Adherence    Patient reported X missed doses in the last month: 0  Specialty Medication: tacrolimus 1 MG capsule (PROGRAF)  Patient is on additional specialty medications: No  Patient is on more than two specialty medications: No  Any gaps in refill history greater than 2 weeks in the last 3 months: no  Demonstrates understanding of importance of adherence: yes  Informant: patient  Reliability of informant: reliable  Provider-estimated medication adherence level: good  Patient is at risk for Non-Adherence: No  Reasons for non-adherence: no problems identified              Were doses missed due to medication being on hold? No    tacrolimus 1 MG capsule (PROGRAF)   : 10 days of medicine on hand       REFERRAL TO PHARMACIST     Referral to the pharmacist: Not needed      [facility]     Shipping address confirmed in Epic.       Delivery Scheduled: Yes, Expected medication delivery date: 05/04/23.     Medication will be delivered via Same Day Courier to the prescription address in Epic WAM.    Sandra Underwood' W Sandra Underwood Shared [facility] Specialty Technician

## 2023-05-04 MED FILL — TACROLIMUS 1 MG CAPSULE, IMMEDIATE-RELEASE: ORAL | 30 days supply | Qty: 60 | Fill #2

## 2023-06-05 NOTE — Unmapped (Signed)
[facility] Refill Coordination Note    Specialty Medication(s) to be Shipped:   CF/Pulmonary/Asthma: tacrolimus 1 MG capsule (PROGRAF)    Other medication(s) to be shipped: No additional medications requested for fill at this time     Sandra Underwood, DOB: 12-14-2000  Phone: [phone] (home)       All above HIPAA information was verified with patient.     Was a Nurse, learning disability used for this call? No    Completed refill call assessment today to schedule patient's medication shipment from the [facility] [phone]).  All relevant notes have been reviewed.     Specialty medication(s) and dose(s) confirmed: Regimen is correct and unchanged.   Changes to medications: Charmeka reports no changes at this time.  Changes to insurance: No  New side effects reported not previously addressed with a pharmacist or physician: None reported  Questions for the pharmacist: No    Confirmed patient received a Conservation officer, historic buildings and a Surveyor, mining with first shipment. The patient will receive a drug information handout for each medication shipped and additional FDA Medication Guides as required.       DISEASE/MEDICATION-SPECIFIC INFORMATION        N/A    SPECIALTY MEDICATION ADHERENCE     Medication Adherence    Patient reported X missed doses in the last month: 0  Specialty Medication: tacrolimus 1 MG capsule (PROGRAF)  Patient is on additional specialty medications: No  Patient is on more than two specialty medications: No  Any gaps in refill history greater than 2 weeks in the last 3 months: no  Demonstrates understanding of importance of adherence: yes  Informant: patient  Reliability of informant: reliable  Provider-estimated medication adherence level: good  Patient is at risk for Non-Adherence: No  Reasons for non-adherence: no problems identified              Were doses missed due to medication being on hold? No    tacrolimus 1 MG capsule (PROGRAF)   : 10 days of medicine on hand       REFERRAL TO PHARMACIST     Referral to the pharmacist: Not needed      [facility]     Shipping address confirmed in Epic.       Delivery Scheduled: Yes, Expected medication delivery date: 06/09/23.     Medication will be delivered via Same Day Courier to the prescription address in Epic WAM.    Sandra Underwood' W Danae Chen Shared [facility] Specialty Technician

## 2023-06-09 MED FILL — TACROLIMUS 1 MG CAPSULE, IMMEDIATE-RELEASE: ORAL | 30 days supply | Qty: 60 | Fill #3

## 2023-06-16 ENCOUNTER — Ambulatory Visit: Admit: 2023-06-16 | Discharge: 2023-06-17

## 2023-06-16 NOTE — Unmapped (Signed)
[place] [facility] ([facility]      Dear Dr. Silvestre Moment    I recently spoke with our shared patient and recommendations are as follows. See note 06/16/2023 for rationale supporting recommendation(s).      Action Item: Please respond within 5 business days should you agree with the BELOW medication changes to serve as a verbal order.     Patient's insurance does not cover Lantus brand insulin. Recommend prescriber switch to [place] 34 units daily as formulary alternative.     Patient 's blood glucose is elevated. Recommended the patient call and schedule earlier follow up, given transition off study and potential interest in change to Omnipod or pump.      Should you agree and wish to implement the above medication changes, I can take a verbal order and communicate the changes to the patient and patient's pharmacy. See below for possible orders:    Tresiba U-100 insulin (100 units/mL).  Inject up to 50 units per day as per MD instructions.  45 mL, 1 R.      Future Appointments   Date Time Provider Department Center   06/24/2023  9:00 AM Amie [place], FNP [facility]   10/05/2023  8:00 AM Rosiland Oz, MD [facility]            Duke Salvia, CPP, PharmD   Clinical Pharmacist- [place] [facility] ([facility])   [facility]AMP Clinic: [phone] - Fax: [phone]

## 2023-06-16 NOTE — Unmapped (Signed)
[facility]  Diabetes Targeted - Follow Up Visit     Name: Sandra Underwood  Date of Birth: August 19, 2001  Today's Date: 06/16/2023  Age: 22 y.o.    To patients reading this note: Please be advised that the primary purpose of this note is for [place] to keep track of your care and communicate with other members of your medical team. These suggested changes are intended to enhance the overall care you receive.  Approved changes will be communicated to you by your care team.     Provider Recommendations    Patient's insurance does not cover Lantus brand insulin.  Recommend prescriber switch to [place] 34 units daily as formulary alternative.      Patient 's blood glucose is elevated.  Recommended the patient call and schedule earlier follow up, given transition off study and potential interest in change to Omnipod or pump.         Patient Summary      Diabetes  Patient reported treatment: completed study and transitioned back to prior meds (with prior supplies) - insulin glargine (Lantus/Toujeo) has ~1 box left, 34 units, insulin lispro (Humalog) based on carbs and correction before meals; may do additional afternoon correction - seeing slower response than inhaled insulin on study.  Still considering pump after things slow down with summer travel, such as Omnipod 5 or Tandem    Home blood glucose monitoring: Yes. Patient using CGM - Dexcom G7  TIR - 37%; high 33%, very high 30%  Hypoglycemia: No  Hyperglycemia: No    Assessment / Plan  Lab Results   Component Value Date    A1C 7.3 (H) 12/24/2022    A1C 6.8 (H) 06/17/2022    A1C 6.5 01/21/2022   Reports A1c was checked at end of study (June) however she did not receive results.     Currently due for A1c recheck. Goal A1c <7% per ADA guidelines.    Specific recommendations to provider noted above  Reviewed timing of insulin injections given recent change in agents.   Reviewed symptoms and treatment of hypoglycemia  Recommend routine blood glucose monitoring  Counseled regarding lifestyle modifications  Due for A1c and BMP  _____________________________________________________      Medication Management   Medications reviewed with patient & education provided regarding medications and [facility]. Reviewed the indication, dose, frequency, and affordability concerns (if applicable).  Patient to request cetirizine refills from PCP or obtain OTC when needed.     Notes and recommendations from today's visit have been sent to the appropriate provider(s)      Duke Salvia, CPP, PharmD  Clinical Pharmacist- [facility]  [facility]: [phone] - Fax: [phone]      The patient reports they are physically located in [place] and is currently: at home. I conducted a phone visit.  I spent 25 minutes on the phone call with the patient on the date of service .     Future Appointments   Date Time Provider Department Center   06/16/2023 10:00 AM Shanon Brow Lyn, CPP [facility]   06/24/2023  9:00 AM Amie Portland, FNP [facility]   10/05/2023  8:00 AM Rosiland Oz, MD [facility]      Current Medications:     Current Outpatient Medications on File Prior to Visit   Medication Sig Dispense Refill    acetaminophen (TYLENOL) 500 MG tablet Take by mouth.  blood sugar diagnostic Strp Use to check blood sugar four (4) times a day (before meals and nightly). 300 each 12    blood-glucose sensor (DEXCOM G7 SENSOR) Devi Use to monitor blood glucose levels continuously. Change sensor every 10 days. 9 each 1    cetirizine (ZYRTEC) 10 MG tablet Take 1-2 tablets (10-20 mg total) by mouth two (2) times a day for hives. 120 tablet 0    chlorhexidine (PERIDEX) 0.12 % solution Rinse 5-48ml by mouth twice daily for two weeks 473 mL 1    clindamycin (CLEOCIN) 300 MG capsule Take 1 capsule (300 mg total) by mouth every six (6) hours.      clindamycin (CLEOCIN) 300 MG capsule Take 1 capsule (300 mg total) by mouth four (4) times a day. 28 capsule 0    DULoxetine (CYMBALTA) 60 MG capsule Take 1 capsule (60 mg total) by mouth daily. 90 capsule 1    HYDROcodone-acetaminophen (NORCO) 5-325 mg per tablet Take one tablet by mouth every 4-6 hours as needed for pain. 5 tablet 0    insulin glargine (LANTUS SOLOSTAR U-100 INSULIN) 100 unit/mL (3 mL) injection pen Use up to 50 units per day as per MD instructions, backup for pump failure. (Patient taking differently: 0.34 mL (34 Units total). Use up to 50 units per day as per MD instructions, backup for pump failure.) 45 mL 12    insulin lispro (HUMALOG KWIKPEN INSULIN) 100 unit/mL injection pen Inject 0-50 Units under the skin daily. Needs an appointment for further refills. [phone] 45 mL 0    insulin lispro (HUMALOG U-100 INSULIN) 100 unit/mL injection Use up to 100 units/day, divided into 3 times daily before meals as per MD instructions 90 mL 12    insulin pump cart,auto,BT-cntr (OMNIPOD 5 G6 INTRO KIT, GEN 5,) Crtg Started kit includes controller and 11 pods.  Change pods every 3 days or as instructed by your health care professional. 1 each 0    insulin pump cart,automated,BT (OMNIPOD 5 G6 PODS, GEN 5,) Crtg Pods to be changed every 3 days or as instructed by your health care professional. 10 each 11    lancets (ONETOUCH DELICA LANCETS) 33 gauge Misc Use to check bloof sugar 6 times daily 200 each 3    levothyroxine (SYNTHROID) 25 MCG tablet Take 1 tablet (25 mcg total) by mouth daily. 90 tablet 3    norethindrone-ethinyl estradiol (JUNEL 1/20, 21,) 1-20 mg-mcg per tablet Take 1 tablet by mouth daily. 84 tablet 1    pen needle, diabetic (BD ULTRA-FINE NANO PEN NEEDLE) 32 gauge x 5/32 (4 mm) Ndle Use as directed 4 times daily. 100 each 12    pen needle, diabetic (BD ULTRA-FINE NANO PEN NEEDLE) 32 gauge x 5/32 (4 mm) Ndle Use to administer insulin 6-8x/day 200 each 3    STUDY MKC-TI-193 insulin degludec U-100 100 unit/mL (3 mL) injection pen Inject subcutaneously in the thigh, upper arm, or abdomen as directed by study team.  Target fasting glucose: 90 - 120 mg/dL. 3 kit 0    STUDY MKC-TI-193 Insulin Human Inhalation Powder 12 unit cartridge Inhale dose prior to each meal as instructed by study team.  Inhale correction dosing as instructed by study team.  Open blistercards must be used within 3 days. 2 kit 0    STUDY MKC-TI-193 Insulin Human Inhalation Powder 4 Unit/8 Unit titration pack Inhale dose prior to each meal as instructed by study team.  Inhale correction dosing as instructed by study team.  Open blistercards  must be used within 3 days. 3 kit 0    STUDY MKC-TI-193 Insulin Human Inhalation Powder 4 Unit/8 Unit titration pack Inhale dose prior to each meal as instructed by study team.  Inhale correction dosing as instructed by study team.  Open blistercards must be used within 3 days. 1 kit 0    syringe with needle 1 mL 28 gauge x 1/2 Syrg Use every 7 (seven) days.      tacrolimus (PROGRAF) 1 MG capsule Take 1 capsule (1 mg total) by mouth two (2) times a day. 60 capsule 5     No current facility-administered medications on file prior to visit.

## 2023-06-17 MED ORDER — INSULIN DEGLUDEC (U-100) 100 UNIT/ML (3 ML) SUBCUTANEOUS PEN
1 refills | 0 days | Status: CP
Start: 2023-06-17 — End: ?

## 2023-06-17 NOTE — Unmapped (Signed)
Addended by: Duke Salvia on: 06/17/2023 04:05 PM     Modules accepted: Orders

## 2023-06-17 NOTE — Unmapped (Signed)
Addended by: Duke Salvia on: 06/17/2023 04:07 PM     Modules accepted: Orders

## 2023-06-17 NOTE — Unmapped (Signed)
[facility]    Name: Sandra Underwood  Date of Birth: 08-20-2001  MRN: [id number]      Called pt to discuss medication changes approved by Dr. Silvestre Moment.      Medication changes are as follows:  Evaristo Bury U-100 insulin (100 units/mL). Inject up to 50 units per day as per MD instructions. 45 mL, 1 R.     The above adjustments were reviewed with the patient. Education was provided regarding indication, dose, route, frequency, and possible side effects. Pt verbalized understanding. Medication list updated.  May continue with current supply of Lantus and switch when ready for refill.      Prescription(s) were e-scribed to: [facility]      Shanon Brow, PharmD, CPP  Clinical Pharmacist, [facility]  [facility]: [phone] - Fax: [phone]

## 2023-07-01 ENCOUNTER — Institutional Professional Consult (permissible substitution): Admit: 2023-07-01 | Discharge: 2023-07-02

## 2023-07-01 NOTE — Unmapped (Signed)
[facility] Appointment Note  Study:  OPTI-2  Description: A Phase 2b Randomized, Double-blind Trial Comparing HDVInsulin Lispro Versus Insulin Lispro Alone in Adults with Type 1 Diabetes Receiving Insulin Degludec   Protocol:  DP 11-2021-01   Site Number: 05  [facility] IRB#: [id number]  PI: Noelle Penner, MD, PhD  Visit: Screening  Source Version:2      Patient: Sandra Underwood  Date: 28Aug2024  DOB: February 27, 2001      Informed Consent Process  Thomas Hoff signed HIPAA and latest consent forms (main consent version dated 22Jul2024) in a private, quiet environment prior to the start of any trial procedures. Subject had received the ICF prior to this visit to review. Coordinator reviewed the consent thoroughly with the subject. Subject had time to review the consent and ask questions. All questions were answered to the subject's satisfaction. Subject was given a copy of the signed consent, and the original is secured at the site.     Time of consent: 0858    *document accordingly if a re-screen-include previous subject number*    Checklist:    X Informed consents (main, unencrypted communication)   X HIPAA authorization form   X W-9 Form   X Medical release form   X Inclusion/Exclusion   X Vital signs(height, weight, BMI)   X Demographics   X Medical Hx   X Concomitant Medications   X Physical Exam (see provider note)   X 12-Lead ECG   X Laboratory tests( A1c, Hematology, Chemistry)(C-peptide per PI discretion)   X Urine Pregnancy         Inclusion/Exclusion     Inclusion Criteria   43 to <46 years of age at time of signing informed consent Yes, 37   Able to effectively communicate with the Investigator and study personnel Yes,     Able to provide informed consent. A signed informed consent form (ICF) must be on file prior to initiating the Screening procedures. Yes,     Clinical diagnosis by the Investigator of T1D, or C-peptide ?0.6 ng/ml and using insulin for at least 6 months.   Yes, 10 years MDI insulin regimen prior to entering study. CSII subjects may enter screening and be converted to MDI therapy at the start of Run-In Period if eligible. Yes, current MDI   Willing to use study-provided insulin lispro as the only bolus insulin and insulin degludec as the only basal insulin during the study.*   *Degludec will be used once daily in either the morning or the evening, but not both, and will be used at approximately the same time of day throughout the study. Yes, states willing   Willing to use a CGM device throughout the study*  *At the end of the Run-In period and before randomization, the ability of subject to successfully deploy study CGM device (defined as >80% of possible data for at least 10 of 14 days), to use degludec and lispro as only insulin during trial, and to complete the study smartphone app will be assessed. Yes, willing   Screening A1C ?6.0% and ?10.0%. A1C may be repeated once if result is ??0.3 beyond the allowable range.  *Historical A1C collected within 28 days is acceptable for screening Pending   BMI ?18.0 kg/m2 and ?35.0 kg/m2 Yes, 28.8   Daily insulin dose ? 1.25 U/kg/d Yes, w/ max insulin dose=0.99U/kg/d   Investigator judgement that the subject cognitively can understand the protocol and can carry out all necessary study requirements.  Yes,  see provider note   Willingness to use a smartphone with app capability for duration of study; basic understanding of and general familiarity with smartphone usage and apps Yes,     Females of childbearing potential must agree, for the duration of the study, to use adequate contraceptive measures, such as intra-uterine device, oral or injectable contraceptives, or barrier method plus spermicide. Yes, oral contraceptive   Exclusion Criteria   Known hemoglobinopathy that could interfere with A1C measurement (sickle cell trait is not an exclusion) No,     Known or suspected allergy to any component of any of the study drugs in this trial or history of severe reaction to the sensor adhesive that is being used in the study No,     Pregnant or breast-feeding, or plan to become pregnant at any time during the duration of the study No,     Current use of hydroxyurea or unable to avoid hydroxyurea use during the study (interferes with accuracy of Dexcom sensor) No,     Severe hypoglycemia event meeting criteria for a Level 3 event in the prior 90 days No,     Use of noninsulin glucose-lowering medications, weight loss medications, or dietary supplements for weight loss within prior 30 days, or anticipated used during the course of the study No,     Use of systemic corticosteroids (does not include topical, inhaled, or intra-articular routes of administration) at the time of screening or anticipated use during the course of the study No,     Received any investigational drug within prior 30 days No,     Liver enzymes greater than 1.5 x ULN of central laboratory's normal range, unless an exception is granted by the investigator and medical monitor Pending   Clinically significant abnormalities on Screening laboratory testing in the judgment of the investigator Pending   Presence of a medical condition or use of a medication that, in the judgment of the investigator, could compromise the results of the study or the safety of the subject.  Conditions to be considered by the investigator may include the following:  Alcohol or drug abuse  Use of prescription drugs that may dull the sensorium, reduce sensitivity to symptoms of hypoglycemia, hinder decision making; or interfere with insulin action, glucose utilization, or recovery from hypoglycemia   Severe neuropathy (in particular autonomic neuropathy or gastroparesis), as determined by the investigator  Uncontrolled hypertension defined as SBP>160 mmHg on medication or DBP>100 mmHg on medication  History of or findings on electrocardiogram (ECG) of cardiac arrhythmia or conduction defect that is clinically significant in the judgment of the investigator  Coronary artery disease that is not stable with medical management, including unstable angina, angina that prevents moderate exercise (e.g., climbing a flight of stairs) despite medical management, or within the last 12 months before screening a history of myocardial infarction, percutaneous coronary intervention, enzymatic lysis of a presumed coronary occlusion, or coronary artery bypass grafting   Congestive heart failure with New York Heart Association (NYHA) Functional Classification III or IV   History of transient ischemic attack or stroke in the last 12 months  Severe liver disease such as cirrhosis  Renal failure requiring dialysis or known eGFR <30  Untreated or inadequately treated mental illness in the opinion of the investigator   History of untreated or inadequately treated eating disorder within the last 2 years, such as anorexia,bulimia, or diabulimia or omission of insulin to manipulate weight  History of intentional or inappropriate administration of insulin leading to severe hypoglycemia  requiring treatment No, see provider note   Employed by, or having immediate family members employed by [employer]; or being directly involved in conducting the clinical trial or having a International aid/development worker at place of employment who is also directly involved in conducting the clinical trial (as a study International aid/development worker, coordinator, etc.); or having a first-degree relative who is directly involved in conducting the clinical trial No,     Current or previous (within the past 6 months) Child-Pugh (CP) score ? 7, unless due to an alternative etiology such as Gilbert's syndrome or therapeutic anticoagulation  Note: If subject does not have a medical history of liver disease including cirrhosis, and in the opinion of the Investigator does not show signs of cirrhosis or indicative of a CP score ?7, the Investigator may indicate that the liver disease exclusion has not been met.  If in the opinion of the Investigator the subject does appear to be at risk for cirrhosis or having a CP score ?7, a local lab may be used for the collection and resulting of the subject's INR value to determine the full CP score. Please include local lab results in the subject's source record if indicated. Pending       Demographics  DOB: 2001/04/30  Sex: female  Race: White  Ethnicity: Not Hispanic, Latino/a, or Spanish origin      Medical history  Medical History is any condition that is ongoing when the subject signs Informed Consent form. Enter all relevant (as judged by the Investigator) medical, surgical, and allergic conditions from the subject???s medical history.        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 Medications that the patient started and ended before the first dose of study drug will be noted as prior medications.       Medication Name  (Generic / Trade Name) Dose Frequency Start Date  (dd/mmm/yyyy) Stop Date  (dd/mmm/yyyy) Primary Indication  Route of Administration   cetirizine (ZYRTEC)  10 MG tablet 2 tab BID 08/2020  Medical history - allergies oral   DULoxetine (CYMBALTA)  60 MG capsule Daily 06/2022  Medical history - depression, anxiety oral   insulin glargine (LANTUS SOLOSTAR U-100 INSULIN) 100 unit/mL (3 mL) injection pen 34U 34U  2018  Medical history - T1D subcutaneous   insulin lispro (HUMALOG KWIKPEN INSULIN) 100 unit/mL injection pen Sliding scale  Up to 50 units daily take w/meals 2018  Medical history - T1D subcutaneous   levothyroxine (SYNTHROID)     25 MCG tablet 1 tab Daily 2014  Medical history - hypothyroidism oral   norethindrone-ethinyl estradiol (JUNEL 1/20, 21,)  1-20 mg-mcg per tablet 1 tab daily 2015  Prophylaxis oral   tacrolimus (PROGRAF)     1 MG capsule  BID  08/2020  Medical history - Urticaria oral   neomycin  4 drops daily for 7 days 22Aug2024  Medical history - L outer ear infection otic              Does the subject have a history or current usage of any prohibited medications noted in section 11.2 of the protocol?    11.2:  Use of the following medications during the study is prohibited: Any insulin other than the study-provided insulins is prohibited  Any non-insulin glucose-lowering medication used for either glucose control or weight loss, other than metformin Weight loss medications Systemic corticosteroids and anabolic steroids (does not include topical, inhaled, or intraarticular routes of administration)  []  Yes*           [x]  No  *If usage is current, the subject is a screen failure. If the subject is willing to suspend usage for protocol required timeframes, they may rescreen at that time. If the subject has suspended use, please record the medication/therapy and stop dates below.   [x]  N/A  Name of Restricted Medication/Therapy Date Stopped  (dd/mmm/yyyy)                     Anthropometrics    Height : 167.6cm   Weight : 80.9kg   BMI calculated: 28.8  BMI must be ?18.0 kg/m2 and ?33.0 kg/m2 to be included in the study    Vital signs  Subject must rest 5 minutes before reading BP.     Time: 0914  BP: 103/76  Pulse(beats/min): 97  Oral temperature: 36.8C  Respiratory rate(breaths/min): 18    Physical:  Was physical exam completed? Yes (see provider note)    ECG:  Was ECG completed? Time: 0934  (Any CS diagnosis must be listed as medical hx)    Fasting status  Is subject fasting? Yes  If the participant is not fasting as required, the participant should be called in for a new site visit within the visit window to have the fasting assessments done.    Labs  Were all labs collected (A1c, c-peptide, hematology, chemistry)? Yes(no C-peptide, not needed per provider) Time: 0272  Note: Historical A1C measurement can be used within the 28 days prior to screening    Was historical HbA1c collected? No  What was the date of collection?    What was the result?     Is the subject of childbearing potential?     [x]  Yes      []  No  *If 'No', please specify:   []  Post-menopausal          Date of Last Menstrual Period:          __  __  /  __  __  __  /  __  __  __  __ (dd/mmm/yyyy)  []  Surgically Sterile (Add to Medical History page)  []  Other (please specify):       If WOCBP, was the urine pregnancy test performed? Yes Time: [phone]  What was the result? Negative    Adverse Events  Has the subject experienced any Adverse Events since signing the Informed Consent?   []   Yes (If 'Yes', please complete an Adverse Events Form)  [x]   No      Scheduling    Run-in visit: TBD  Randomization (14 days +/-3 after run-in):     Screening Period may be up to 21 days. Subjects may be rescreened once at a later date per Investigator???s discretion  and [employer] CMO approval.     Run-In may be repeated once per Investigator???s discretion. / Randomization at 14+3 days after initiation of Run-In. / End of Run-In and Randomization occur on the same day if CGM compliance is satisfactory. / Run-in phase completed within 35??3 days of Screening unless repeated (49??3 days)     Martin Majestic, CMA

## 2023-07-01 NOTE — Unmapped (Signed)
[facility] Appointment Note  Study:  OPTI-2  Description: A Phase 2b Randomized, Double-blind Trial Comparing HDVInsulin Lispro Versus Insulin Lispro Alone in Adults with Type 1 Diabetes Receiving Insulin Degludec   Protocol:  DP 11-2021-01   Site Number: 05  [place] IRB#: [id number]  PI: Noelle Penner, MD, PhD  Visit: Screening  Source Version:2      Patient: Sandra Underwood      Patient seen for screening visit. Patient signed ICF(s) with coordinator and was given opportunity to ask questions and receive answers from both coordinator and investigator.         Provider has reviewed and updated research subject's allergy, med list, and problem list.   [x]  Yes  []  Not done, reason:     Does the subject have a history or current usage of any prohibited medications noted in section 11.2 of the protocol?    11.2:  Use of the following medications during the study is prohibited: Any insulin other than the study-provided insulins is prohibited  Any non-insulin glucose-lowering medication used for either glucose control or weight loss, other than metformin Weight loss medications Systemic corticosteroids and anabolic steroids (does not include topical, inhaled, or intraarticular routes of administration)  []  Yes*           [x]  No  *If usage is current, the subject is a screen failure. If the subject is willing to suspend usage for protocol required timeframes, they may rescreen at that time. If the subject has suspended use, please record the medication/therapy and stop dates below.     Inclusion/Exclusion (investigator driven below)    Inclusion Criteria   Daily insulin dose ? 1.25 U/kg/d yes   Investigator judgement that the subject cognitively can understand the protocol and can carry out all necessary study requirements.  yes      Known hemoglobinopathy that could interfere with A1C measurement (sickle cell trait is not an exclusion) no   Known or suspected allergy to any component of any of the study drugs in this trial or history of severe reaction to the sensor adhesive that is being used in the study no   Severe hypoglycemia event meeting criteria for a Level 3 event in the prior 90 days no   Use of noninsulin glucose-lowering medications, weight loss medications, or dietary supplements for weight loss within prior 30 days, or anticipated used during the course of the study no   Liver enzymes greater than 1.5 x ULN of central laboratory's normal range, unless an exception is granted by the investigator and medical monitor pending   Clinically significant abnormalities on Screening laboratory testing in the judgment of the investigator pending   Presence of a medical condition or use of a medication that, in the judgment of the investigator, could compromise the results of the study or the safety of the subject.  Conditions to be considered by the investigator may include the following:  Alcohol or drug abuse  Use of prescription drugs that may dull the sensorium, reduce sensitivity to symptoms of hypoglycemia, hinder decision making; or interfere with insulin action, glucose utilization, or recovery from hypoglycemia   Severe neuropathy (in particular autonomic neuropathy or gastroparesis), as determined by the investigator  Uncontrolled hypertension defined as SBP>160 mmHg on medication or DBP>100 mmHg on medication  History of or findings on electrocardiogram (ECG) of cardiac arrhythmia or conduction defect that is clinically significant in the judgment of the investigator  Coronary artery disease that is not  stable with medical management, including unstable angina, angina that prevents moderate exercise (e.g., climbing a flight of stairs) despite medical management, or within the last 12 months before screening a history of myocardial infarction, percutaneous coronary intervention, enzymatic lysis of a presumed coronary occlusion, or coronary artery bypass grafting   Congestive heart failure with New York Heart Association (NYHA) Functional Classification III or IV   History of transient ischemic attack or stroke in the last 12 months  Severe liver disease such as cirrhosis  Renal failure requiring dialysis or known eGFR <30  Untreated or inadequately treated mental illness in the opinion of the investigator   History of untreated or inadequately treated eating disorder within the last 2 years, such as anorexia,bulimia, or diabulimia or omission of insulin to manipulate weight  History of intentional or inappropriate administration of insulin leading to severe hypoglycemia requiring treatment no   Current or previous (within the past 6 months) Child-Pugh (CP) score ? 7, unless due to an alternative etiology such as Gilbert's syndrome or therapeutic anticoagulation. Can use chart history and medical judgement for Child-Pugh Exclusion no     I reviewed the inclusion/exclusion criteria (investigator driven listed above).  [x]  Yes  []  Not done, reason:       Body System Findings If Abnormal, comment   General Appearance [x]  Normal     []  Abnormal  []  Not Assessed  []  Clinically significant  []  Not clinically significant   Skin []  Normal     [x]  Abnormal  []  Not Assessed tattoos []  Clinically significant  [x]  Not clinically significant   Lymphatics [x]  Normal     []  Abnormal  []  Not Assessed  []  Clinically significant  []  Not clinically significant   HEENT (head, ears, eyes, nose, throat) [x]  Normal      []  Abnormal  []  Not Assessed  []  Clinically significant  []  Not clinically significant   Cardiovascular [x]  Normal      []  Abnormal  []  Not Assessed  []  Clinically significant  []  Not clinically significant   Respiratory [x]  Normal      []  Abnormal  []  Not Assessed  []  Clinically significant  []  Not clinically significant   Abdominal [x]  Normal      []  Abnormal  []  Not Assessed  []  Clinically significant  []  Not clinically significant   Musculoskeletal   [x]  Normal      []  Abnormal  []  Not Assessed  []  Clinically significant  []  Not clinically significant   Neurological [x]  Normal      []  Abnormal  []  Not Assessed  []  Clinically significant  []  Not clinically significant   Other, specify:  ________________   []  Normal      []  Abnormal  []  Not Assessed  []  Clinically significant  []  Not clinically significant     ECG  Was ECG reviewed by provider?    [x]   Normal     []  Abnormal, Clinically Significant (CS)    []  Abnormal, Not Clinically Significant (NCS)    Thompson Grayer, MD

## 2023-07-04 DIAGNOSIS — E1065 Type 1 diabetes mellitus with hyperglycemia: Principal | ICD-10-CM

## 2023-07-17 NOTE — Unmapped (Signed)
[facility] Appointment Note  Study:  OPTI-2  Description: A Phase 2b Randomized, Double-blind Trial Comparing HDVInsulin Lispro Versus Insulin Lispro Alone in Adults with Type 1 Diabetes Receiving Insulin Degludec   Protocol:  DP 11-2021-01   Site Number: 05  [place] IRB#: [id number]  PI: Noelle Penner, MD, PhD  Visit: Run-In  Source Version:1      Patient: Rheba Minyard  Date: 16Sep2024  DOB: 07-22-2001    Checklist:    X Concomitant Medication    X Adverse Events    X Eligibility Assessment   X Transition Into Study: Review/Educate Carb:Insulin Factors  (see provider note)   X Educate Subject on Carbohydrate counting (see provider note)   X Dexcom G7 App Set-Up     X Review Event Entry in G7 App    X Smartphone App Review    X Mean Subject Basal Dose (Recorded from Screening Week / Week prior to Visit)   X Vital Signs & Weight   X Provide Hypoglycemic Symptoms Log to subject     X Notify PCP of patient participation (if signed in consent to do this)     Inclusion/Exclusion    Pending from screening:    Screening A1C >=6.0% and <=10.0%  *Historical A1C collected within 28 days is acceptable for screening Yes, 7.7     Liver enzymes greater than 1.5 x ULN of central laboratory's normal range, unless an exception is granted by the investigator and medical monitor C-peptide must be <0.6 nmol/L  No, ALT:19 AST:18   Clinically significant abnormalities on Screening laboratory testing in the judgment of the investigator No,       Current or previous (within the past 6 months) Child-Pugh (CP) score >= 7, unless due to an alternative etiology such as Gilbert's syndrome or therapeutic anticoagulation. Can use chart history and medical judgement for Child-Pugh Exclusion No,       Adverse Events  Has the subject experienced any Adverse Events since signing the Informed Consent?   []   Yes (If 'Yes', please complete an Adverse Events Form)  [x]   No    Has the subject taken any new medications or changed any medication usage since the last visit? No.(If ???Yes???, please complete a Concomitant Medications Form)   Medication Name  (Generic / Trade Name) Dose and Frequency Start Date  (dd/mmm/yyyy) Stop Date  (dd/mmm/yyyy) Class of Medication  Primary Indication     Does the subject continue to meet all eligibility criteria to participate in the study? Yes. (If ???No???, please ensure the Inclusion and Exclusion criteria is updated within the Screening Visit in the [facility] and document the subject as a Screen Failure in the End of Study Visit)    Review/Educate  (See provider note)  Subjects must record:  ? Time of food intake (entry of carbohydrate estimates) ? Time and dosage of basal insulin ? Time and dosage of prandial insulin. Doses taken within 20 min before or after a main meal will be considered prandial doses; those taken at times more distant from a main meal will be defined as corrective doses.   Patient notified of this? Yes    Dexcom G7 App Set-Up    Confirm the Dexcom G7 sensor was provided to subject and linked to G7 App?  Yes.   Confirm that an identified account was set up for the subject in the Dexcom G7 App and linked to the site's Clarity software account?   Yes.   Was  Subject Dexcom account linked to the Sites Clarity viewing software using printed (not emailed link) invitation?  Yes.   Confirm that it was verified that the Dexcom G7 App and Clarity software are communicating before the subject leaves the site? Yes.  * For detail on linking subjects Clarity account to sites Clarity account, please see Site FAQ and Reference Guide.      Dexcom / Glooko Linking     Is it confirmed that Glooko link is working properly?*   *For technical support with Glooko linking, reference Glooko startup guide and/or please call Glooko helpdesk at [phone] as well as notify your CRA regarding the issue and its resolution.  Yes.     Subject Basal Dose  (See provider note)    Vital signs  Subject must rest 5 minutes before reading BP.     Time: 1104    BP: 105/79    Pulse(beats/min): 95    Oral temperature: 36.9C    Respiratory rate(breaths/min): 18    Weight 177.8lb      Insulin Dispensing - Visit    Please ensure to capture dispensing information in the Drug Accountability Log.       Dispensed? Date   Was Basal Insulin dispensed?   Yes. 16Sep2024   Was Bolus Insulin dispensed?   Yes. 16Sep2024     ID/Quantity  Dispensing Date Return Date ML   [id number] 16Sep2024     [id number] C 16Sep2024             Device and Supply Dispensing Details     Number of Dexcom G7 sensors dispensed to subject (as needed): 5     Sensor Serial Number(s):  [id number]     Number of insulin syringes dispensed to subject:  Starter 70-month bag   Number of pen needles dispensed to subject:  Starter 73-month bag   Number of BGM test strips dispensed to subject:  Starter 67-month bag   Hypoglycemic Logs (give one today) 3     Was PCP notified of study participation?    No. Reason: will notify at randomization.    Other:    Scheduling    Randomization (14 days +/-3 after run-in): 30Sep2024@1130   End of Run-In and Randomization occur on the same day if CGM compliance is satisfactory. / Run-in phase completed within 35??3 days of Screening unless repeated (49??3 days) Greater than 80% CGM data for at least 10 of 14 days, adherence with the prescribed insulin regimen, and use of the smartphone app. If minimum use criteria are not met, the run-in may be repeated one time.If participant is using insulins other than degludec and lispro then switch to these for run-in and provide dosing recommendations for the switchover.       Martin Majestic, CMA

## 2023-07-20 ENCOUNTER — Institutional Professional Consult (permissible substitution): Admit: 2023-07-20 | Discharge: 2023-07-21

## 2023-07-20 MED ORDER — STUDY DP-01-2023-01 INSULIN LISPRO 100 UNITS/ML INJECTION SOLUTION
0 refills | 0 days | Status: CP
Start: 2023-07-20 — End: ?

## 2023-07-20 MED ORDER — STUDY DP 01-2023-01 INSULIN DEGLUDEC 100 UNITS/ML INJECTION PEN
PEN_INJECTOR | 0 refills | 0 days | Status: CP
Start: 2023-07-20 — End: ?

## 2023-07-20 NOTE — Unmapped (Signed)
[facility] Appointment Note  Study:  OPTI-2  Description: A Phase 2b Randomized, Double-blind Trial Comparing HDVInsulin Lispro Versus Insulin Lispro Alone in Adults with Type 1 Diabetes Receiving Insulin Degludec   Protocol:  DP 11-2021-01   Site Number: 05  [place] IRB#: [id number]  PI: Noelle Penner, MD, PhD  Visit: Run-In  Source Version:1      Patient: Sandra Underwood  Date: 07/20/23   DOB: [birth year]    Education    Carbohydrate Counting Education    Confirm that the following were explained in detail with the subject:     Importance of Consistent Data Entry and Data Sufficiency (>=80% CGM values)   Specifics of App Event Data Entry: ALL insulin doses   Mealtime stamps for Main meals or snacks (>15g carbohydrates)   Carbohydrate value entry with Mealtime stamp.       Confirm that titration/onboarding to Degludec and Lispro was reviewed with the subject     Confirm that Carbohydrate Counting education was provided to the subject                         [x]  Yes            [x]  Yes          [x]  Yes          [x]  Yes                 [x]  Yes              [x]  Yes*             *List materials used: none. Currently uses 1 unit for every 8 grams of carbs. Uses myfitness pal and my calorie king. Uses CF 1:50>150   Initial insulin degludec dosage will be determined based on the subject???s current use of basal insulin, titrated on a weekly basis  to achieve fasting glucose of 80 - 110 mg/dL.   Initial insulin lispro dosage will be determined based on the subject???s current use of bolus insulin and titrated on a weekly basis  to achieve a 2-hour post prandial glucose of <140 mg/dl.    Review the Subject's dose for the week leading up to visit. This is the value that will be recorded and used to inform the dosing titration at the start of Randomization visit.   When dispensing basal insulin, please instruct Subject that the basal dose must be taken once daily in the morning or in the evening, not both, at approximately the same time of day throughout the entire period of the study (per protocol).     Date Basal Dose Time Taken   07/17/2023 34 8:30 PM   07/18/2023 34 8:30 PM   07/19/2023 34 8:30 PM      Subject Basal Dose  _34U    Will Subject Take Their Basal Dose in the AM or PM?  PM      Inclusion/Exclusion    Pending from screening:    Screening A1C >=6.0% and <=10.0%  *Historical A1C collected within 28 days is acceptable for screening Yes, 7.7     Liver enzymes greater than 1.5 x ULN of central laboratory's normal range, unless an exception is granted by the investigator and medical monitor C-peptide must be <0.6 nmol/L  No, ALT:19 AST:18    Clinically significant abnormalities on Screening laboratory testing in the judgment of the investigator No     Current or  previous (within the past 6 months) Child-Pugh (CP) score >= 7, unless due to an alternative etiology such as Gilbert's syndrome or therapeutic anticoagulation. Can use chart history and medical judgement for Child-Pugh Exclusion No       Thompson Grayer, MD

## 2023-07-30 NOTE — Unmapped (Signed)
[facility] - T1DM Return Visit    Assessment and Plan:  Sandra Underwood is a 22 y.o. female with PMHx of T1DM and chronic urticaria on tacrolimus who is being seen in follow-up for T1DM.    1. Type 1 diabetes without known complication, currently participating in clinical trial OPTI-2  A1c: HbA1c 6.6%, previously 7.3% (12/2022). I think A1c is underestimate of true glycemic control given TIR ~50% and GMI in high 7s. Once she completes OPTI-2 trial participation, we will begin working toward goal HbA1c <6.5% without hypoglycemia given interest in conception in the future.  Brief DM History: Diagnosed with T1DM at age 75. She has been on and off of pump therapy recently and participated in a couple of clinical trials, including INHALE and now OPTI-2.  Current Regimen: Tresiba 34 units daily, Humalog ICR 1:8, 1:50>150  CGM Review: CGM downloaded and interpreted x 14 days. Daily trends reviewed including patterns. Patterns include: MBG 165, SD 63, 58% TIR, 3% low, 39% high, 99% time active.  Today: Since last visit, Sandra Underwood concluded participation in INHALE and is about to be randomized in OPTI-2, therefore she has been on an MDI regimen to allow for participation in these studies. The OPTI-2 trial team will be managing her insulin doses while she is in the trial, therefore we will pick up once her participation in the trial concludes. She anticipates an engagement to her boyfriend soon and is thinking about conception/pregnancy in the next couple of years. Today, we briefly discussed pre-conception A1c goal of 6.5%, which we can start working toward together after OPTI-2. I anticipate that we would likely try to move back toward pump, but we can see.    *The patient is currently using the therapeutic CGM as prescribed.  *The patient has diabetes mellitus.  *The patient is insulin-treated with 2 or more daily injections of insulin(MDII).   *The patient has been using a home blood glucose monitor (BGM) and has performed tests 4 times per day or more prior to starting use of a CGM.   *The patient???s insulin treatment regimen requires frequent adjustments by the patient on the basis of BGM and/or therapeutic CGM testing results.   *Within the last six months, I had an in-person visit with the beneficiary to evaluate their diabetes control and determine that the above criteria are met.   *Every six months I will continue to have an in-person visit with the beneficiary to assess adherence to their CGM regimen and diabetes treatment plan.    2. Diabetes-related complications and prevention  - Hypoglycemia: Endorses hypoglycemic awareness around high 50s-low 60s. She has unexpired glucagon.  - Eyes: Last eye exam 08/2022 with [facility]. No retinopathy. Due 08/2023. Encouraged her to schedule.  - Kidneys: Last urine MA/C negative (02/2022) and GFR >90 (06/2022). Due 02/2023. I believe she has these checked at OPTI-2 screening, will check in with study coordinator.  - Feet: No symptoms. No concerns on exam. Due 07/2023.  - BP: WNL  - Lipids/ASCVD risk: Last lipids with LDL 69, HDL 54, TChol 147, TG 118 (02/2021).  - TSH: 1.761 in 12/2022. Due 12/2023.  - Contraception: OCPs     Return in about 3 months (around 11/02/2023).    Patient was discussed with Dr. Tiburcio Pea.    Rosiland Oz, MD, PhD  PGY-5, Division of Endocrinology  Phone: [phone]  Fax: [phone]    Subjective:  Sandra Underwood is a 22 y.o. female with PMHx of T1DM and chronic urticaria  on tacrolimus who is being seen in follow-up for T1DM.    Initial history obtained by Patric Dykes on 03/01/2018: Sandra Underwood is a senior in [employer] taking college courses at her local community college in anticipation of a attending [employer] in the Fall of 2019 for business and Social research officer, government. She is interested in becoming a Best boy.     The patient was initially diagnosed with Type 1 diabetes mellitus in November 2014.   History of hospitalizations for DKA: yes x3 in 2018, 2/2 UTI x2 and food poisoning x1.  FH of autoimmune dysfunction: yes - Mom has ulcerative colitis.  Hx of auto-immune co-morbidities: No.      Current symptoms/problems: none  Known diabetic complications: none  Current diabetic medications: Lantus pen 32u @ HS without misses doses, Novolog 1u:8g carbs and 1u:50points >150. Taking Novolog 1-2x/day.      Previous medication trials: metformin for 1 day at diagnosis.     Eye exam current (within one year): yes - Dr. Dede Query, [facility] in 07/2017. No hx of DR. No vision change. 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      Menstrual cycle: Menarche@ 22yo, OBCP @22yo , taking Junel 21d w/o placebo week per GYN. Has menses once every 6-9 months.      Current monitoring regimen: Freestyle Libre with receiver on her phone  Blood glucose meter to visit: phone  Download: Not able to download from phone at this time. Phone reviewed manually. Checking 1-2x/day. FBG 111-160mg /dl.      Any episodes of severe hypoglycemia (requiring the assistance of others)? no  Hypoglycemic awareness: 70mg /dl     ACE inhibitor or angiotensin II receptor blocker? no  Statin? no  ASA? no  Weight trend: stable  Current diet: high carb 30g -B, 90g - L, 150g - D.   Exercise: no. Barriers to exercise: none    TODAY (08/04/23): She completed participation in INHALE, really liked inhaled insulin and would be interested in using it if it were better covered by insurance. She is participating in a new clinical trial, will go in for randomization this week to be randomized to control group or test group that has an additive in Humalog. She thinks she's getting proposed to this weekend. Dating for about a year.    Past Medical History:   Diagnosis Date    Allergic conjunctivitis     Allergic rhinitis     Anxiety     Arthritis     ankles and elbows    Depression     Disease of thyroid gland     DKA (diabetic ketoacidoses)     DM (diabetes mellitus), type 1 (CMS-HCC)     Food allergy     lemon    Type 1 Duane's syndrome      Past Surgical History:   Procedure Laterality Date    TONSILLECTOMY AND ADENOIDECTOMY  10/2015     Family History   Problem Relation Age of Onset    Ulcerative colitis Mother     Asthma Maternal Grandfather     Hypertension Maternal Grandfather     Cancer Maternal Grandmother         Lung Cancer    Diabetes Paternal Grandmother         Type 2    Cataracts Neg Hx     Glaucoma Neg Hx      Social History     Socioeconomic History    Marital status: Single  Highest education level: Associate degree: occupational, Scientist, product/process development, or vocational program   Tobacco Use    Smoking status: Never     Passive exposure: Never    Smokeless tobacco: Never    Tobacco comments:     once every 2 months   Vaping Use    Vaping status: Never Used   Substance and Sexual Activity    Alcohol use: Not Currently     Alcohol/week: 2.0 standard drinks of alcohol     Comment: hard liquor once a month- social    Drug use: Never     Comment: previous use of MDMA    Sexual activity: Yes     Partners: Male     Birth control/protection: Other     Comment: pill     Social Determinants of Health     Financial Resource Strain: Low Risk  (12/24/2022)    Overall Financial Resource Strain (CARDIA)     Difficulty of Paying Living Expenses: Not hard at all   Food Insecurity: No Food Insecurity (12/24/2022)    Hunger Vital Sign     Worried About Running Out of Food in the Last Year: Never true     Ran Out of Food in the Last Year: Never true   Transportation Needs: No Transportation Needs (12/24/2022)    PRAPARE - Transportation     Lack of Transportation (Medical): No     Lack of Transportation (Non-Medical): No   Stress: Stress Concern Present (09/15/2022)    [employer] of Occupational Health - Occupational Stress Questionnaire     Feeling of Stress : Very much   Social Connections: Moderately Integrated (09/15/2022)    Social Connection and Isolation Panel [NHANES]     Frequency of Communication with Friends and Family: More than three times a week     Frequency of Social Gatherings with Friends and Family: Twice a week     Attends Religious Services: More than 4 times per year     Active Member of [employer] or Organizations: Yes     Attends Banker Meetings: Never     Marital Status: Never married     Allergies   Allergen Reactions    Amoxicillin-Pot Clavulanate Rash    Grass Pollen Hives    Ibuprofen Swelling    Lemon Hives    Malt Extract Hives    Metronidazole Hives    Peanut Hives    Tree And Shrub Pollen Hives    Amoxicillin Rash    Penicillins Rash     Current Outpatient Medications   Medication Instructions    acetaminophen (TYLENOL) 500 MG tablet Oral    blood sugar diagnostic Strp Use to check blood sugar four (4) times a day (before meals and nightly).    blood-glucose sensor (DEXCOM G7 SENSOR) Devi Use to monitor blood glucose levels continuously. Change sensor every 10 days.    cetirizine (ZYRTEC) 10 MG tablet Take 1-2 tablets (10-20 mg total) by mouth two (2) times a day for hives.    chlorhexidine (PERIDEX) 0.12 % solution Rinse 5-56ml by mouth twice daily for two weeks    clindamycin (CLEOCIN) 300 mg, Oral, 4 times a day    DULoxetine (CYMBALTA) 60 mg, Oral, Daily (standard)    HumaLOG KwikPen Insulin 0-50 Units, Subcutaneous, Daily (standard), Needs an appointment for further refills. [phone]    HYDROcodone-acetaminophen (NORCO) 5-325 mg per tablet Take one tablet by mouth every 4-6 hours as needed for pain.    insulin degludec (TRESIBA  FLEXTOUCH U-100) 100 unit/mL (3 mL) InPn Inject 0.5 mL (50 Units total) under the skin daily as per MD instructions..    insulin lispro (HUMALOG U-100 INSULIN) 100 unit/mL injection Use up to 100 units/day, divided into 3 times daily before meals as per MD instructions    insulin pump cart,auto,BT-cntr (OMNIPOD 5 G6 INTRO KIT, GEN 5,) Crtg Started kit includes controller and 11 pods.  Change pods every 3 days or as instructed by your health care professional.    insulin pump cart,automated,BT (OMNIPOD 5 G6 PODS, GEN 5,) Crtg Pods to be changed every 3 days or as instructed by your health care professional.    JUNEL 1/20, 21, 1-20 mg-mcg per tablet 1 tablet, Oral, Daily (standard)    lancets (ONETOUCH DELICA LANCETS) 33 gauge Misc Use to check bloof sugar 6 times daily    levothyroxine (SYNTHROID) 25 mcg, Oral, Daily (standard)    neomycin-polymyxin-hydrocortisone (CORTISPORIN) 3.5-10,000-1 mg/mL-unit/mL-% otic suspension ADMINISTER 4 DROPS INTO THE LEFT EAR 4 (FOUR) TIMES A DAY FOR 7 DAYS FOR 7-10 DAYS.    pen needle, diabetic (BD ULTRA-FINE NANO PEN NEEDLE) 32 gauge x 5/32 (4 mm) Ndle Use as directed 4 times daily.    STUDY DP-11-2021-01 insulin degludec U-100 100 unit/mL (3 mL) injection pen Inject 34 units subcutaneously once daily as directed at the same time of day (morning or evening) throughout the study duration <BR>REFRIGERATE    STUDY DS-11-2021-01 insulin lispro (HUMALOG) 100 units/mL injection solution Inject subcutaneously three times daily before meals per investigator's instructions to target a 2-hour post prandial blood sugar of 140mg /dL.<BR>REFRIGERATE    STUDY MKC-TI-193 Insulin Human Inhalation Powder 12 unit cartridge Inhale dose prior to each meal as instructed by study team.  Inhale correction dosing as instructed by study team.  Open blistercards must be used within 3 days.    STUDY MKC-TI-193 Insulin Human Inhalation Powder 4 Unit/8 Unit titration pack Inhale dose prior to each meal as instructed by study team.  Inhale correction dosing as instructed by study team.  Open blistercards must be used within 3 days.    STUDY MKC-TI-193 Insulin Human Inhalation Powder 4 Unit/8 Unit titration pack Inhale dose prior to each meal as instructed by study team.  Inhale correction dosing as instructed by study team.  Open blistercards must be used within 3 days.    syringe with needle 1 mL 28 gauge x 1/2 Syrg Use every 7 (seven) days. tacrolimus (PROGRAF) 1 mg, Oral, 2 times a day       Objective:  Vitals:    08/03/23 0807   BP: 99/65   Pulse: 101   Temp: 36.3 ??C (97.3 ??F)       Wt Readings from Last 6 Encounters:   08/03/23 81.6 kg (180 lb)   12/24/22 75.8 kg (167 lb)   09/22/22 73 kg (161 lb)   08/15/22 70.8 kg (156 lb)   08/08/22 68 kg (150 lb)   07/21/22 69.1 kg (152 lb 6.4 oz)     Physical Exam  Constitutional:       General: She is not in acute distress.     Appearance: She is not ill-appearing.   Cardiovascular:      Rate and Rhythm: Normal rate and regular rhythm.   Pulmonary:      Effort: Pulmonary effort is normal. No respiratory distress.      Breath sounds: Normal breath sounds.   Musculoskeletal:      Right lower leg: No edema.  Left lower leg: No edema.   Skin:     General: Skin is warm and dry.      Comments: Wearing Dexcom G7 on RUE. Site clean and intact. No lipohypertrophy on abdomen.   Neurological:      Mental Status: She is alert. Mental status is at baseline.   Psychiatric:         Mood and Affect: Mood normal.         Behavior: Behavior normal.       Data Review: I personally reviewed all available, pertinent labs, imaging, and outside records.    Pump Settings:  N/A    CGM Data:      Portions of this record may have been created using Scientist, clinical (histocompatibility and immunogenetics). Dictation errors have been sought but may not have all been identified and corrected.

## 2023-07-30 NOTE — Unmapped (Unsigned)
duplicate data within the last week prior to visit, confirm with subject that those days are complete data - a complete day should have one basal dose, multiple bolus doses and carbohydrate events; do not use any day with fewer than two bolus doses)   Adherence of once daily basal insulin dosing. Subject may administer basal insulin once daily, in the morning or the evening and at approximately the same time each day   Review Hypoglycemic Log for these events    Investigator review: Satisfactory assessment of smartphone app use         Days with CGM Data: _______%     Randomization     Re-confirm subject eligibility (see provider note)  Inclusion/Exclusion          Inclusion Criteria   59 to <59 years of age at time of signing informed consent Yes, 77   Able to effectively communicate with the Investigator and study personnel Yes,     Able to provide informed consent. A signed informed consent form (ICF) must be on file prior to initiating the Screening procedures. Yes,     Clinical diagnosis by the Investigator of T1D, or C-peptide <=0.6 ng/ml and using insulin for at least 6 months.    Yes, 10 years   MDI insulin regimen prior to entering study. CSII subjects may enter screening and be converted to MDI therapy at the start of Run-In Period if eligible. Yes, current MDI   Willing to use study-provided insulin lispro as the only bolus insulin and insulin degludec as the only basal insulin during the study.*   *Degludec will be used once daily in either the morning or the evening, but not both, and will be used at approximately the same time of day throughout the study. Yes, states willing   Willing to use a CGM device throughout the study*  *At the end of the Run-In period and before randomization, the ability of subject to successfully deploy study CGM device (defined as >80% of possible data for at least 10 of 14 days), to use degludec and lispro as only insulin during trial, and to complete the study smartphone app will be assessed. Yes, willing   Screening A1C >=6.0% and <=10.0%. A1C may be repeated once if result is ??0.3 beyond the allowable range.  *Historical A1C collected within 28 days is acceptable for screening Yes, 7.7    BMI >=18.0 kg/m2 and <=35.0 kg/m2 Yes, 28.8   Daily insulin dose <= 1.25 U/kg/d Yes, w/ max insulin dose=0.99U/kg/d   Investigator judgement that the subject cognitively can understand the protocol and can carry out all necessary study requirements.  Yes, see provider note   Willingness to use a smartphone with app capability for duration of study; basic understanding of and general familiarity with smartphone usage and apps Yes,     Females of childbearing potential must agree, for the duration of the study, to use adequate contraceptive measures, such as intra-uterine device, oral or injectable contraceptives, or barrier method plus spermicide. Yes, oral contraceptive   Exclusion Criteria   Known hemoglobinopathy that could interfere with A1C measurement (sickle cell trait is not an exclusion) No,     Known or suspected allergy to any component of any of the study drugs in this trial or history of severe reaction to the sensor adhesive that is being used in the study No,     Pregnant or breast-feeding, or plan to become pregnant at any time during the duration of the study  No,     Current use of hydroxyurea or unable to avoid hydroxyurea use during the study (interferes with accuracy of Dexcom sensor) No,     Severe hypoglycemia event meeting criteria for a Level 3 event in the prior 90 days No,     Use of noninsulin glucose-lowering medications, weight loss medications, or dietary supplements for weight loss within prior 30 days, or anticipated used during the course of the study No,     Use of systemic corticosteroids (does not include topical, inhaled, or intra-articular routes of administration) at the time of screening or anticipated use during the course of the study No,     Received any investigational drug within prior 30 days No,     Liver enzymes greater than 1.5 x ULN of central laboratory's normal range, unless an exception is granted by the investigator and medical monitor No, ALT:19 AST:18    Clinically significant abnormalities on Screening laboratory testing in the judgment of the investigator No   Presence of a medical condition or use of a medication that, in the judgment of the investigator, could compromise the results of the study or the safety of the subject.  Conditions to be considered by the investigator may include the following:  Alcohol or drug abuse  Use of prescription drugs that may dull the sensorium, reduce sensitivity to symptoms of hypoglycemia, hinder decision making; or interfere with insulin action, glucose utilization, or recovery from hypoglycemia   Severe neuropathy (in particular autonomic neuropathy or gastroparesis), as determined by the investigator  Uncontrolled hypertension defined as SBP>160 mmHg on medication or DBP>100 mmHg on medication  History of or findings on electrocardiogram (ECG) of cardiac arrhythmia or conduction defect that is clinically significant in the judgment of the investigator  Coronary artery disease that is not stable with medical management, including unstable angina, angina that prevents moderate exercise (e.g., climbing a flight of stairs) despite medical management, or within the last 12 months before screening a history of myocardial infarction, percutaneous coronary intervention, enzymatic lysis of a presumed coronary occlusion, or coronary artery bypass grafting   Congestive heart failure with New York Heart Association (NYHA) Functional Classification III or IV   History of transient ischemic attack or stroke in the last 12 months  Severe liver disease such as cirrhosis  Renal failure requiring dialysis or known eGFR <30  Untreated or inadequately treated mental illness in the opinion of the investigator   History of untreated or inadequately treated eating disorder within the last 2 years, such as anorexia,bulimia, or diabulimia or omission of insulin to manipulate weight  History of intentional or inappropriate administration of insulin leading to severe hypoglycemia requiring treatment No, see provider note   Employed by, or having immediate family members employed by [employer]; or being directly involved in conducting the clinical trial or having a International aid/development worker at place of employment who is also directly involved in conducting the clinical trial (as a study International aid/development worker, coordinator, etc.); or having a first-degree relative who is directly involved in conducting the clinical trial No,     Current or previous (within the past 6 months) Child-Pugh (CP) score >= 7, unless due to an alternative etiology such as Gilbert's syndrome or therapeutic anticoagulation  Note: If subject does not have a medical history of liver disease including cirrhosis, and in the opinion of the Investigator does not show signs of cirrhosis or indicative of a CP score >=7, the Investigator may indicate that the liver disease exclusion has not  been met.  If in the opinion of the Investigator the subject does appear to be at risk for cirrhosis or having a CP score >=7, a local lab may be used for the collection and resulting of the subject's INR value to determine the full CP score. Please include local lab results in the subject's source record if indicated. No            Randomize Subject in IWRS (*print randomization screen screenshot to file in Subject source binder*)    Randomization number: ***    Anthropometrics    Height(m)2 : ***   Weight(kg): ***   BMI calculated: ***  BMI must be >=18.0 kg/m2 and <=33.0 kg/m2 to be included in the study    Vital signs  Subject must rest 5 minutes before reading BP.     Time: ***  BP: ***  Pulse(beats/min): ***  Oral temperature: ***  Respiratory rate(breaths/min): ***    Hypoglycemic Symptoms Log     Was the Hypoglycemic Symptoms Log dispensed to the subject?      {yes; if no, list ZOX:[id number]}   Was the Hypoglycemic Symptoms Log reviewed?   {yes; if no, list VWU:98119}   Were any Hypoglycemic Symptoms experienced?   {yes/why:[phone]}     Physical:  Was physical exam completed? {YES/NO:29867} (see provider note)    Labs  Were all labs collected (A1c, hematology, chemistry)? {YES /NO:21308} Time: ***    If WOCBP, was the urine pregnancy test performed? {YES /NO:21308} Time: ***  What was the result? {Pos/Neg:37645}    Patient-Reported Outcomes Survey   Was PRO survey completed and filed in Subject binder? {YES /NO:21308}     Insulin Dispensing - Visit       Was Basal Insulin dispensed?   {yes; if no, list JYN:82956}   Was Bolus Insulin dispensed?   {yes; if no, list why:54328}   Was HDV-Lispro / sWFI-Lispro prepared as per drug preparation instructions? (0.80mL HDV or sWFI into 10mL vial of Lispro)         {yes; if no, list OZH:08657}     ID Dispensing Date Return Date ML                         Device and Supply Dispensing Details     Number of Dexcom G7 sensors dispensed to subject (as needed): ***     Sensor Serial Number(s):  ***     Number of insulin syringes dispensed to subject:  ***   Number of pen needles dispensed to subject:  ***   Number of BGM test strips dispensed to subject:  ***   Hypoglycemic Logs give one today (additional as needed): ***     Bolus/Basal Insulin Administration for 3 days leading into visit:    Day Date Time of Basal Basal Dose (U) Meal Meal Time Carb Value(g) BolusTime Bolus Dose(U) 2 hour post-prandial BS   1         Breakfast            Lunch            Dinner          Day Date Time of Basal Basal Dose Meal Meal Time Carb Value(g) BolusTime Bolus Dose(U) 2 hour post-prandial BS   2         Breakfast            Lunch  Dinner          Day Date Time of Basal Basal Dose Meal Meal Time Carb Value(g) Bolus Time Bolus Dose(U) 2 hour post-prandial BS   3         Breakfast Lunch            Dinner          Other  ***    Scheduling     Dose Optimization Visit Week 1 (+/-4 days)Phone:  Dose Optimization Visit Week 2 (+/-4 days)Phone:  Dose Optimization Visit Week 3 (+/-4 days)Phone:  Dose Optimization Visit Week 4 (+/-4 days)In-Person:  Dose Optimization Visit Week 5 (+/-4 days)Phone:  Dose Optimization Visit Week 6 (+/-4 days)Phone:  Dose Optimization Visit Week 7 (+/-4 days)Phone:  Dose Optimization Visit Week 8 (+/-4 days)In-Person:  Dose Optimization Visit Week 9 (+/-4 days)Phone:  Dose Optimization Visit Week 10 (+/-4 days)Phone:  Dose Optimization Visit Week 11 (+/-4 days)Phone:  Dose Optimization Visit Week 12 (+/-4 days)In-Person:  Maintenance Period Week 16 (+/-4 days) In-Person:  Maintenance Period Week 20 (+/-4 days) In-Person:  Maintenance Period Week 24 (+/-4 days) In-Person:  Maintenance Period Week 25 (+/-4 days) In-Person:  Transition Period/Follow up Week 27(+/-4 days)Phone:  End of treatment In-person:     Martin Majestic, CMA

## 2023-08-02 DIAGNOSIS — N926 Irregular menstruation, unspecified: Principal | ICD-10-CM

## 2023-08-02 MED ORDER — JUNEL 1/20 (21) 1 MG-20 MCG TABLET
ORAL_TABLET | Freq: Every day | ORAL | 1 refills | 0 days
Start: 2023-08-02 — End: ?

## 2023-08-03 ENCOUNTER — Institutional Professional Consult (permissible substitution): Admit: 2023-08-03 | Discharge: 2023-08-04 | Payer: PRIVATE HEALTH INSURANCE

## 2023-08-03 ENCOUNTER — Ambulatory Visit
Admit: 2023-08-03 | Discharge: 2023-08-04 | Payer: PRIVATE HEALTH INSURANCE | Attending: Student in an Organized Health Care Education/Training Program | Primary: Student in an Organized Health Care Education/Training Program

## 2023-08-03 DIAGNOSIS — E1065 Type 1 diabetes mellitus with hyperglycemia: Principal | ICD-10-CM

## 2023-08-03 MED ORDER — JUNEL 1/20 (21) 1 MG-20 MCG TABLET
ORAL | 1 refills | 84 days | Status: CP
Start: 2023-08-03 — End: ?

## 2023-08-03 NOTE — Unmapped (Signed)
Patient is requesting the following refill  Requested Prescriptions     Pending Prescriptions Disp Refills    JUNEL 1/20, 21, 1-20 mg-mcg per tablet [Pharmacy Med Name: JUNEL 1 MG-20 MCG TABLET] 84 tablet 1     Sig: TAKE 1 TABLET BY MOUTH EVERY DAY       Recent Visits  Date Type Provider Dept   12/24/22 Office Visit Amie Portland, FNP [facility] [address] [facility]   09/22/22 Office Visit Clapp, Magnus Ivan, FNP [facility] [address] [facility]   08/15/22 Office Visit Clapp, Magnus Ivan, FNP [facility] [address] [facility]   Showing recent visits within past 365 days and meeting all other requirements  Future Appointments  Date Type Provider Dept   08/14/23 Appointment Coralee North, Magnus Ivan, FNP [facility] [address] At [facility]   Showing future appointments within next 365 days and meeting all other requirements       Labs:

## 2023-08-03 NOTE — Unmapped (Signed)
Dexcom downloaded to Clarity.        PP 6:45 am     BS 189 mg/dL.    12/24/2022  RESULT: 7.3

## 2023-08-03 NOTE — Unmapped (Signed)
Call Ophthalmology [phone] to schedule your eye exam.

## 2023-08-03 NOTE — Unmapped (Unsigned)
[facility] Appointment Note  Study:  OPTI-2  Description: A Phase 2b Randomized, Double-blind Trial Comparing HDVInsulin Lispro Versus Insulin Lispro Alone in Adults with Type 1 Diabetes Receiving Insulin Degludec   Protocol:  DP 11-2021-01   Site Number: 05  [place] IRB#: [id number]  PI: Noelle Penner, MD, PhD  Visit: Randomization  Source Version:1    Patient: Sandra Underwood  Date: 03Oct2024  DOB: Jun 24, 2001    Checklist:    Randomization  X Re-confirm eligibility   X Clarity review    X Randomize in IWRS (print and file)   X Record Randomization #   Post-randomization  X Vital signs/weight   X Adverse Events   X Concomitant medications   X Physical Exam   X Central Labs (A1c, Chemistry, Hematology)   X Urine Pregnancy   X PRO survey   X Insulin and supply dispensing     Visit Details:    Was the Subject???s data reviewed in the Clarity Software prior to Scheduled Visit Date?  Yes    Date: 02Oct2024  Adverse Events  Has the subject experienced any Adverse Events since signing the Informed Consent?   []   Yes (If 'Yes', please complete an Adverse Events Form)  [x]   No    Has the subject taken any new medications or changed any medication usage since the last visit? No.(If ???Yes???, please complete a Concomitant Medications Form)   Medication Name  (Generic / Trade Name) Dose and Frequency Start Date  (dd/mmm/yyyy) Stop Date  (dd/mmm/yyyy) Class of Medication  Primary Indication                                     Does the subject continue to meet all eligibility criteria to participate in the study? Yes. (If ???No???, please ensure the Inclusion and Exclusion criteria is updated within the Screening Visit in the [facility] and document the subject as a Screen Failure in the End of Study Visit)    End of Run-In Procedures (Prior to Randomization)    Review the following in the Clarity Software:   Insulin Dosages, Mealtimes, and Carbohydrate Values (identify three days of complete data within the last week prior to visit, confirm with subject that those days are complete data - a complete day should have one basal dose, multiple bolus doses and carbohydrate events; do not use any day with fewer than two bolus doses)   Adherence of once daily basal insulin dosing. Subject may administer basal insulin once daily, in the morning or the evening and at approximately the same time each day   Review Hypoglycemic Log for these events    Investigator review: Satisfactory assessment of smartphone app use         Days with CGM Data: 93%     Randomization     Re-confirm subject eligibility (see provider note)         Inclusion/Exclusion          Inclusion Criteria   69 to <83 years of age at time of signing informed consent Yes, 50   Able to effectively communicate with the Investigator and study personnel Yes,     Able to provide informed consent. A signed informed consent form (ICF) must be on file prior to initiating the Screening procedures. Yes,     Clinical diagnosis by the Investigator of T1D, or C-peptide <=0.6 ng/ml and using insulin  for at least 6 months.    Yes, 10 years   MDI insulin regimen prior to entering study. CSII subjects may enter screening and be converted to MDI therapy at the start of Run-In Period if eligible. Yes, current MDI   Willing to use study-provided insulin lispro as the only bolus insulin and insulin degludec as the only basal insulin during the study.*   *Degludec will be used once daily in either the morning or the evening, but not both, and will be used at approximately the same time of day throughout the study. Yes, states willing   Willing to use a CGM device throughout the study*  *At the end of the Run-In period and before randomization, the ability of subject to successfully deploy study CGM device (defined as >80% of possible data for at least 10 of 14 days), to use degludec and lispro as only insulin during trial, and to complete the study smartphone app will be assessed. Yes, willing Screening A1C >=6.0% and <=10.0%. A1C may be repeated once if result is ??0.3 beyond the allowable range.  *Historical A1C collected within 28 days is acceptable for screening Yes, 7.7    BMI >=18.0 kg/m2 and <=35.0 kg/m2 Yes, 28.8   Daily insulin dose <= 1.25 U/kg/d Yes, w/ max insulin dose=0.99U/kg/d   Investigator judgement that the subject cognitively can understand the protocol and can carry out all necessary study requirements.  Yes, see provider note   Willingness to use a smartphone with app capability for duration of study; basic understanding of and general familiarity with smartphone usage and apps Yes,     Females of childbearing potential must agree, for the duration of the study, to use adequate contraceptive measures, such as intra-uterine device, oral or injectable contraceptives, or barrier method plus spermicide. Yes, oral contraceptive   Exclusion Criteria   Known hemoglobinopathy that could interfere with A1C measurement (sickle cell trait is not an exclusion) No,     Known or suspected allergy to any component of any of the study drugs in this trial or history of severe reaction to the sensor adhesive that is being used in the study No,     Pregnant or breast-feeding, or plan to become pregnant at any time during the duration of the study No,     Current use of hydroxyurea or unable to avoid hydroxyurea use during the study (interferes with accuracy of Dexcom sensor) No,     Severe hypoglycemia event meeting criteria for a Level 3 event in the prior 90 days No,     Use of noninsulin glucose-lowering medications, weight loss medications, or dietary supplements for weight loss within prior 30 days, or anticipated used during the course of the study No,     Use of systemic corticosteroids (does not include topical, inhaled, or intra-articular routes of administration) at the time of screening or anticipated use during the course of the study No,     Received any investigational drug within prior 30 days No,     Liver enzymes greater than 1.5 x ULN of central laboratory's normal range, unless an exception is granted by the investigator and medical monitor No, ALT:19 AST:18    Clinically significant abnormalities on Screening laboratory testing in the judgment of the investigator No, see lab sheet   Presence of a medical condition or use of a medication that, in the judgment of the investigator, could compromise the results of the study or the safety of the subject.  Conditions to be  considered by the investigator may include the following:  Alcohol or drug abuse  Use of prescription drugs that may dull the sensorium, reduce sensitivity to symptoms of hypoglycemia, hinder decision making; or interfere with insulin action, glucose utilization, or recovery from hypoglycemia   Severe neuropathy (in particular autonomic neuropathy or gastroparesis), as determined by the investigator  Uncontrolled hypertension defined as SBP>160 mmHg on medication or DBP>100 mmHg on medication  History of or findings on electrocardiogram (ECG) of cardiac arrhythmia or conduction defect that is clinically significant in the judgment of the investigator  Coronary artery disease that is not stable with medical management, including unstable angina, angina that prevents moderate exercise (e.g., climbing a flight of stairs) despite medical management, or within the last 12 months before screening a history of myocardial infarction, percutaneous coronary intervention, enzymatic lysis of a presumed coronary occlusion, or coronary artery bypass grafting   Congestive heart failure with New York Heart Association (NYHA) Functional Classification III or IV   History of transient ischemic attack or stroke in the last 12 months  Severe liver disease such as cirrhosis  Renal failure requiring dialysis or known eGFR <30  Untreated or inadequately treated mental illness in the opinion of the investigator   History of untreated or inadequately treated eating disorder within the last 2 years, such as anorexia,bulimia, or diabulimia or omission of insulin to manipulate weight  History of intentional or inappropriate administration of insulin leading to severe hypoglycemia requiring treatment No, see provider note   Employed by, or having immediate family members employed by [employer]; or being directly involved in conducting the clinical trial or having a International aid/development worker at place of employment who is also directly involved in conducting the clinical trial (as a study International aid/development worker, coordinator, etc.); or having a first-degree relative who is directly involved in conducting the clinical trial No,     Current or previous (within the past 6 months) Child-Pugh (CP) score >= 7, unless due to an alternative etiology such as Gilbert's syndrome or therapeutic anticoagulation  Note: If subject does not have a medical history of liver disease including cirrhosis, and in the opinion of the Investigator does not show signs of cirrhosis or indicative of a CP score >=7, the Investigator may indicate that the liver disease exclusion has not been met.  If in the opinion of the Investigator the subject does appear to be at risk for cirrhosis or having a CP score >=7, a local lab may be used for the collection and resulting of the subject's INR value to determine the full CP score. Please include local lab results in the subject's source record if indicated. No, see provider note             Randomize Subject in IWRS (*print randomization screen screenshot to file in Subject source binder*)    Randomization number: [id number]     Anthropometrics    Height(m)2 : 167.6cm   Weight(kg): 83kg   BMI calculated: 29.5  BMI must be >=18.0 kg/m2 and <=33.0 kg/m2 to be included in the study    Vital signs  Subject must rest 5 minutes before reading BP.     Time: 1052  BP: 111/79  Pulse(beats/min): 102  Oral temperature: 36.8C  Respiratory rate(breaths/min): 16    Hypoglycemic Symptoms Log Was the Hypoglycemic Symptoms Log dispensed to the subject?      Yes.   Was the Hypoglycemic Symptoms Log reviewed?   Yes.   Were any Hypoglycemic Symptoms experienced?  Yes. Reason: lows at night, possibly too much long-acting insulin.     Physical:  Was physical exam completed? Yes (see provider note)    Labs  Were all labs collected (A1c, hematology, chemistry)? Yes Time: 1101    If WOCBP, was the urine pregnancy test performed? Yes Time: 1143  What was the result? Negative    Patient-Reported Outcomes Survey   Was PRO survey completed and filed in Subject binder? Yes    Insulin Dispensing - Visit       Was Basal Insulin dispensed?   Yes.   Was Bolus Insulin dispensed?   Yes.   Was HDV-Lispro / sWFI-Lispro prepared as per drug preparation instructions? (0.83mL HDV or sWFI into 10mL vial of Lispro)         Yes.     ID/Quantity  Dispensing Date Return Date ML   [id number] 16Sep2024  N/A N/A   [id number] C 16Sep2024 03Oct2024  4.4               ID Dispensing Date Return Date ML   [id number] 03Oct2024 N/A    [phone] 03Oct2024     [id number] 03Oct2024         Device and Supply Dispensing Details     Number of Dexcom G7 sensors dispensed to subject (as needed): 3     Sensor Serial Number(s):  [id number]     Number of insulin syringes dispensed to subject:  0   Number of pen needles dispensed to subject:  0   Number of BGM test strips dispensed to subject:  0   Hypoglycemic Logs give one today (additional as needed): 1     Bolus/Basal Insulin Administration for 3 days leading into visit:    Day Date Time of Basal Basal Dose (U) Meal Meal Time Carb Value(g) BolusTime Bolus Dose(U) 2 hour post-prandial BS Time of 2 hour post-prandial BS   1       26Sep2024 2057 34 Breakfast 0933 42 0932 5 283 1133       Lunch 1326 90 1326 11 74 1537       Dinner 1831 50 N/A did not bolus 0 187 2032     Day Date Time of Basal Basal Dose Meal Meal Time Carb Value(g) BolusTime Bolus Dose(U) 2 hour post-prandial BS Time of 2 hour post-prandial BS   2       30Sep2024 2051 34 Breakfast 0610 52 N/A low 0 207 0811       Lunch 1116 102 1116 14 84 1316       Dinner 2006 100 2006 7 103 2206     Day Date Time of Basal Basal Dose Meal Meal Time Carb Value(g) Bolus Time Bolus Dose(U) 2 hour post-prandial BS Time of 2 hour post-prandial BS   3       01Oct2024 2129 34 Breakfast 0902 122 0903 15 66 1101       Lunch 1602 41 1602 5 108 1801       Dinner 1939 20 2019 8 199 2221     Other      Scheduling     Dose Optimization Visit Week 1 (+/-4 days)Phone: 10Oct2024  Dose Optimization Visit Week 2 (+/-4 days)Phone:17OCt2024  Dose Optimization Visit Week 3 (+/-4 days)Phone:24Oct2024  Dose Optimization Visit Week 4 (+/-4 days)In-Person:28Oct2024 @1130       Martin Majestic, CMA Optimization Visit Week 8 (+/-4 days)In-Person:  Dose Optimization Visit Week 9 (+/-4 days)Phone:  Dose Optimization Visit Week 10 (+/-4 days)Phone:  Dose Optimization Visit Week 11 (+/-4 days)Phone:  Dose Optimization Visit Week 12 (+/-4 days)In-Person:  Maintenance Period Week 16 (+/-4 days) In-Person:  Maintenance Period Week 20 (+/-4 days) In-Person:  Maintenance Period Week 24 (+/-4 days) In-Person:  Maintenance Period Week 25 (+/-4 days) In-Person:  Transition Period/Follow up Week 27(+/-4 days)Phone:  End of treatment In-person:     Martin Majestic, CMA

## 2023-08-06 ENCOUNTER — Institutional Professional Consult (permissible substitution): Admit: 2023-08-06 | Discharge: 2023-08-07

## 2023-08-06 DIAGNOSIS — Z006 Encounter for examination for normal comparison and control in clinical research program: Principal | ICD-10-CM

## 2023-08-06 MED ORDER — STUDY DP 01-2023-01 INSULIN DEGLUDEC 100 UNITS/ML INJECTION PEN
PEN_INJECTOR | 0 refills | 0 days | Status: CP
Start: 2023-08-06 — End: ?

## 2023-08-06 MED ORDER — STUDY DP 01-2023-01 HDV-LISPRO U-100 / PLACEBO INJECTION SOLUTION
0 refills | 0 days | Status: CP
Start: 2023-08-06 — End: ?

## 2023-08-06 NOTE — Unmapped (Signed)
[facility] [place] [facility] Appointment Note  Study:  OPTI-2  Description: A Phase 2b Randomized, Double-blind Trial Comparing HDVInsulin Lispro Versus Insulin Lispro Alone in Adults with Type 1 Diabetes Receiving Insulin Degludec   Protocol:  DP 11-2021-01   Site Number: 05  [facility] IRB#: [id number]  PI: Noelle Penner, MD, PhD  Visit: Run-In  Source Version:1      Patient: Sandra Underwood  Date: 08/06/23  DOB: 2001-09-16    End of Run-In Procedures (Prior to Randomization)    Review the following in the Clarity Software:   Insulin Dosages, Mealtimes, and Carbohydrate Values (identify three days of complete data within the last week prior to visit, confirm with subject that those days are complete data - a complete day should have one basal dose, multiple bolus doses and carbohydrate events; do not use any day with fewer than two bolus doses)   Adherence of once daily basal insulin dosing. Subject may administer basal insulin once daily, in the morning or the evening and at approximately the same time each day   Review Hypoglycemic Log for these events    Investigator review: Satisfactory assessment of smartphone app use         Days with CGM Data:93%   The Days with CGM Data value is located in the Clarity Software on the Subject???s Data page, on the right side of the screen. Please enter the % value noted for ???Days with CGM Data??? for the Date Range selected. If the value is outside the expected range of 80-100%, discuss with the subject the reason for this missing data, and reinforce the importance of wearing the CGM during all study days.  Subject will need to repeat the run-in period and achieve >80% wear time before moving into randomization.      Fasting Glucose Value review   Fasting Glucose Values for previous 3 days   Fasting glucose is defined as the lowest CGM value observed between 2:00 AM and start of the breakfast meal   Confirm with subject these are true fasting values to be used in basal dose calculations below   Confirm dose amount and timing for degludec once daily    Current Carbohydrate:Insulin Factor   Confirm Carb:Insulin Factor being used by subject for meal boluses and record on worksheet below   Review recorded carbohydrates and main meal boluses over past 3 days to confirm subject is appropriately dosing with this factor   If bolus amounts do not match expected for carbohydrate:insulin factor discuss with subject as to why this has occurred   Due to concern / risk of hypoglycemia   Due to subject not able to make calculation   Other reasons   Specify  Reinforce with subject the needs to accurately and fully dose meals with carbohydrate:insulin factor, and record insulin doses and carbohydrates with main meals to accurately titration insulin during dose optimization.    Randomization    Does this participant meet randomization criteria? Yes.    Confirm and Reinforce Insulin Doses     BASAL DOSING REVIEW AND ADJUSTMENT   Subject Basal Dose from Previous Week:    This value was recommended by the PI in the previous visit.  Please check the Clarity Software to verify that basal dose is consistent and enter the Subject???s basal dose for last week below. Please note that the subject should not be changing their basal doses day-to-day, so the dose should be the same for all recorded days.  Subject Basal Dose from Previous Week (Week leading into visit)   34 U    *Subject NEW BASAL Dose entering RANDOMIZATION:   (Reduction in BASAL DOSE by 25% required)                                 25U         RECORD RANDOMIZATION VISIT CARB:INSULIN factors:?    *may be increased or decreased if clinically indicated using bolus calculation guidance below       Breakfast Lunch Dinner   8 8 8          BOLUS INSULIN ADJUSTMENT PROTOCOL: GOAL IS 2-HOUR POST-PRANDIAL BLOOD    SUGAR < 140mg /dL. Titration detail is provided below:      The following Table will be used to determine any needed change in Carb to Insulin ratio. Starting values of Carb to Insulin ratio are often between 10 and 15 grams of ingested Carb per unit of insulin, and, as noted above may differ for each meal of the day. In the table, the value of the ratio currently in use is given as ???Xcurrent???, and the adjusted value (determined based on 2-hour postprandial glucose values, assessed by inspection of CGM profiles) is given as ???Xnew???.       NOTE: the decision to change the Carb to Insulin ratio should be based on three or more days??? worth of data for each meal in question, using the MEDIAN 2-hour post prandial glucose (not the MEAN).      Bolus Insulin Degludec Dosing Algorithm:     Algorithm for Adjusting Bolus Insulin Lispro    2-hour Postprandial Glucose (MEDIAN over 3 days)  1 Unit Insulin per   X gram Carbs    <100 mg/dL  Xnew = 1.6 of Xcurrent    100-140 mg/dL  Xnew = Xcurrent    161-096 mg/dL  Xnew = 0.8 of Xcurrent    160-200 mg/dL  Xnew = 0.7 of Xcurrent    >200 mg/dL  Xnew = 0.5 of Xcurrent      2 hour post-prandial median BS    Breakfast 207    Lunch 84    Dinner 187                NEW BOLUS CARB:INSULIN FACTOR DETERMINATION AND REVIEW WITH SUBJECT   Subject CARB:INSULIN factor to be used after randomization:      Breakfast Lunch Dinner   8 8 8        discussed with patient and this is her preference as BGs are quite variable.  She has hypoglycemia 4% of the time.    I have reviewed the information collected in the source data sheet as it applies to the Subject???s dosing titration and I recommend the above dosing titration to be made.   Tarri Abernethy, FNP Yes    Date: 08/06/23     Physical Exam:    Body System Findings If Abnormal, comment   General Appearance [x]  Normal    []  Abnormal  []  Not Assessed  []  Clinically significant  []  Not clinically significant   Skin []  Normal    [x]  Abnormal  []  Not Assessed Scratches to face []  Clinically significant  [x]  Not clinically significant   Lymphatics [x]  Normal    []  Abnormal  []  Not Assessed  []  Clinically significant  []  Not clinically significant   HEENT (head, ears, eyes, nose, throat) [x]   Normal    []  Abnormal  []  Not Assessed  []  Clinically significant  []  Not clinically significant   Cardiovascular [x]  Normal    []  Abnormal  []  Not Assessed  []  Clinically significant  []  Not clinically significant   Respiratory [x]  Normal    []  Abnormal  []  Not Assessed  []  Clinically significant  []  Not clinically significant   Abdominal [x]  Normal    []  Abnormal  []  Not Assessed  []  Clinically significant  []  Not clinically significant   Musculoskeletal   [x]  Normal    []  Abnormal  []  Not Assessed  []  Clinically significant  []  Not clinically significant   Neurological [x]  Normal    []  Abnormal  []  Not Assessed  []  Clinically significant  []  Not clinically significant   Other, specify:  ________________   []  Normal    []  Abnormal Not assessed []  Clinically significant  []  Not clinically significant     Tarri Abernethy, FNP    This progress note may be timestamped after permission was given to the coordinator to proceed. Eligibility was determined by me, the investigator, prior to moving forward.

## 2023-08-12 NOTE — Unmapped (Signed)
[facility]  [employer] ([employer]) Maintenance      DM lab timeline targets updated per program requirements.   Labs are up to date.        Tenna Delaine, CPhT   Certified Pharmacy Technician   [facility] ([facility]   P [phone], F [phone]

## 2023-08-12 NOTE — Unmapped (Addendum)
[facility] Appointment Note  Study:  OPTI-2  Description: A Phase 2b Randomized, Double-blind Trial Comparing HDVInsulin Lispro Versus Insulin Lispro Alone in Adults with Type 1 Diabetes Receiving Insulin Degludec   Protocol:  DP 11-2021-01   Site Number: 05  [place] IRB#: [id number]  PI: Noelle Penner, MD, PhD  Visit: 1  Source Version:1      Patient: Sandra Underwood  Date: 10Oct2024  DOB: 01-Dec-2000    A large portion of this work will be completed and reviewed by the healthcare provider prior to the visit by using the data reviewed in the Clarity software.        Was the Subject???s data reviewed in the Clarity Software prior to Scheduled Visit Date?  [x]   Yes Date:09Oct2024  []   No     Was the Hypoglycemic Symptoms Log discussed with the subject? *Note that paper hypoglycemic symptoms log should be collected at in -person visits and data entered by site staff at that time. Please remind subject to bring the paper hypoglycemic symptoms log to the next in-person visit.      [x]   Yes   []   No    TITRATION PART 1: INSULIN DOSING AND MEALTIME REVIEW      Recording Event Data from Software into Source Documentation      Reminder: a MAIN MEAL is any meal that is >15g of carbohydrate, and NOT a ???snack??? meal or bolus associated with a snack or snack correction. In the table below, record the basal and bolus insulin doses, meal times, and carbohydrate values for the ???most complete??? 3 days leading into the visit.    Please remind the Subject not to change their carb to insulin ratios or basal insulin doses unless instructed to do so by the Investigator.     During the Visit, confirm with the Subject that the 3 days recorded are a complete record for each day (including insulin doses, times, and insulin types, as well as ???meals???/carb events--quantity of carbohydrates and time).   Note: Site staff to use best judgment as to whether data are complete.        Note: Refer to Clarity Guide for Information on Collecting Fasting and Post-Meal Glucose Values.  All Clarity-recorded glucose values used for titration should be screenshotted, initialed, and filed in the subject binder.      BASAL DOSING REVIEW AND TITRATION    Subject Basal Dose from Previous Week    This value was recommended by the PI in the previous visit.  Please check the Clarity Software to verify that basal dose is consistent and enter the Subject???s basal dose for last week below. Please note that the subject should not be changing their basal doses day-to-day, so the dose should be the same for all recorded days.    Subject Basal Dose from Previous Week (Week leading into visit)  25U        Record Basal Insulin Doses Leading into Visit in Table Below:   BASAL INSULIN DOSES (Week leading into visit)        Median Basal Dose for 3 days Leading Into Visit      Day 1  Day 2  Day 3      Date  08Oct2024     (dd/mmm/yyyy)  07Oct2024   (dd/mmm/yyyy)  06Oct2024   (dd/mmm/yyyy)  N/A    Basal Dose   Time of Day     2040  (24 hour-clock)  2107  (24 hour-clock)  2152  (24 hour-clock)  N/A     Value (Units)  25 25 25 25         Identify the Subject???s Median Fasting Blood Glucose Leading into Titration Visit:    The Median Fasting Blood Glucose Value is the middle number in a set of three values.?To find these values, review the Subject???s daily glucose plots in provided software and find the lowest fasting (pre-breakfast) glucose value for the three days within the week leading up to the visit/call day. Please refer to the software training documentation provided for further detail on where exactly to find these values.    Fasting glucose is defined as the lowest CGM value observed between 2:00 AM and start of the breakfast meal or first meal, with at least 8 hours of fasting.  Enter the three values below and sort in order of lowest to highest to determine the Median Fasting Blood Glucose Value prior to making basal dosing recommendation changes.    Fasting Blood Glucose Value Date mg/dl Time of recorded FBG   1 09Oct2024 192 0658   2 08Oct2024 82 0708   3 07Oct2024 42 1253   Please identify the Median of these Values by ordering them from low to high and selecting the middle value (see example below):    Median Fasting Blood Glucose Value   82 mg/dl      Use the median Fasting Blood Glucose from above and the algorithm below to determine the subject???s new basal dose, with the goal of achieving a fasting blood glucose between 80mg /dL and 161 mg/dL.     Algorithm for adjusting basal insulin Degludec    3-Day median fasting    glucose level  Adjustment    to insulin    Degludec  Amount    <80 mg/dL or had an unexplained episode of Level 2 fasting or pre-meal hypoglycemia (i.e., <54 mg/dL) occurs   Reduce by  2 U    80-110 mg/dL  No Change      096-045 mg/dL  Increase by  2 U    409-811 mg/dL  Increase by  4 U    914-782 mg/dL  Increase by  6 U    >956 mg/dL  Increase by  8 U      Degludec Protocol Dosing Recommendation (mark one)   (Please review dosing algorithm table above)    Decreased by 2 units []      No Change               []    Increased by 2 units []    Increased by 4 units []    Increased by 6 units []    Increased by 8 units []     New Basal Dose for Subject:                                       see provider note U      If Investigator chose to deviate from protocol recommendation, please indicate why:       Subject had a documented Level 2 Hypoglycemia (<54 mg/dL) in the last 7 days    []      Subject frequency of Level 1 Hypoglycemia (70mg /dL) has increased in the last 7 days    []    Subject Unwilling             []     Investigator Discretion           []   Explain:       Record Bolus Insulin Doses and Mealtime Carbohydrate Values Leading into Visit in Table Below:     Day 1  Day 2  Day 3  Did Subject Correctly Calculate and Follow Carb to Insulin Dosing Guidance? (comment for each meal)     Date  09Oct2024  (dd/mmm/yyyy)  08Oct2024  (dd/mmm/yyyy)  07Oct2024 (dd/mmm/yyyy)  N/A    Breakfast Bolus Dose   (Carb to Insulin Ratio from previous week)   _________  Time of Day of Breakfast Bolus 0729  (24 hour-clock)  0849  (24 hour-clock)  0942  (24 hour-clock)   see provider note    Bolus Value (Units)  15 10 12      Meal Time  0729  (24 hour-clock)  0809  (24 hour-clock)  0908  (24 hour-clock)      Meal Carb Value (g) 122 47 100     Carb:Insulin Ratio 8.1 4.7 8.3    Lunch Bolus Dose    (Carb to Insulin Ratio from previous week)   _________  Time of Day of Lunch Bolus 1221  (24 hour-clock)  N/A no lunch bolus  (24 hour-clock)  1226  (24 hour-clock)   see provider note     Bolus Value (Units)  13 0 10     Meal Time  1222  (24 hour-clock)  1027  (24 hour-clock)  1226  (24 hour-clock)      Meal Carb Value (g) 108 47 75      Current Carb:Insulin Ratio  8.3  0  7.5    Dinner Bolus Dose    (Carb to Insulin Ratio from previous week)   _________  Time of Day of Dinner Bolus N/A no dinner bolus  (24 hour-clock)  1857  (24 hour-clock)  N/A no dinner bolus  (24 hour-clock)   see provider note     Bolus Value (Units)  0 14 0     Meal Time  1915  (24 hour-clock)  1857  (24 hour-clock)  1817  (24 hour-clock)      Meal Carb Value (g) 65 85 96     Current Carb: Insulin Ratio   0  6.1 0       Identify the Subject???s 2-Hour Postprandial Glucose Values Leading into Titration Visit:      Note: To find the Median 2-hour Postprandial  Glucose (PPG) for a Meal, you must find the glucose value that occurs 2 hours after the mealtime stamp entered by the subject.        CARB:INSULIN RATIO BOLUS CALCULATIONS            Day 1  Day 2  Day 3    Date (most complete previous 3 days)  09Oct2024 (dd/mmm/yyyy)  08Oct2024  (dd/mmm/yyyy)  07Oct2024 (dd/mmm/yyyy)    Breakfast 2-Hour Postprandial Glucose    Glucose Value   128 205 210    PPG TIme  0928  (24 hour-clock)  1008  (24 hour-clock)  1108  (24 hour-clock)    Lunch 2-Hour Postprandial Glucose       Glucose Value   269 136(did not bolus!) 314    PPG Time 1423  (24 hour-clock)  1228  (24 hour-clock)  1428  (24 hour-clock)    Dinner 2-Hour Postprandial Glucose       Glucose Value  100(did not bolus!) 149 90(did not bolus!)    PPG Time     2113  (24 hour-clock)  2058  (24 hour-clock)  2018  (24 hour-clock)           Subject CARB:INSULIN RATIOS used from previous week:      Breakfast Lunch Dinner   Median 2-hour PPG (previous week) 207 84 187   Median 2-hour PPG from week leading into Visit 205 269 100   Carb:Insulin Ratios used from Previous Visit 8 8 8    New Carb:Insulin Ratios  see provider note  see provider note  see provider note           If Investigator chose to deviate from protocol recommendation, please indicate why:    Subject had a documented Level 2 Hypoglycemia (<54 mg/dL) in the last 7 days          []    Subject frequency of Level 1 Hypoglycemia (70mg /dL) has increased in the last 7 days        []    Subject Unwilling                     []    Investigator Discretion           []  Explain:      BOLUS INSULIN ADJUSTMENT PROTOCOL:    GOAL IS 2-HOUR POST-PRANDIAL BLOOD SUGAR < 140mg /dL. Titration detail is provided below:      The following Table will be used to determine any needed change in Carb to Insulin ratio.   In the table, the value of the ratio currently in use is given as ???Xcurrent???, and the adjusted value (determined based on 2-   hour postprandial glucose values, assessed by inspection of CGM profiles) is given as ???Xnew???.       NOTE: the decision to change the Carb to Insulin ratio should be based on three or more days??? worth of data for each meal in question, using the MEDIAN 2-hour post prandial glucose (not the MEAN).      Bolus Insulin Dosing Algorithm:   Algorithm for Adjusting Bolus Insulin     2-hour Postprandial Glucose (MEDIAN over 3 days)  1 Unit Insulin per   X gram Carbs    <100 mg/dL  Xnew = 1.6 of Xcurrent    100-140 mg/dL  Xnew = Xcurrent    295-621 mg/dL  Xnew = 0.8 of Xcurrent    160-200 mg/dL  Xnew = 0.7 of Xcurrent    >200 mg/dL  Xnew = 0.5 of Xcurrent

## 2023-08-12 NOTE — Unmapped (Signed)
[facility] Appointment Note  Study:  OPTI-2  Description: A Phase 2b Randomized, Double-blind Trial Comparing HDVInsulin Lispro Versus Insulin Lispro Alone in Adults with Type 1 Diabetes Receiving Insulin Degludec   Protocol:  DP 11-2021-01   Site Number: 05  [place] IRB#: [id number]  PI: Noelle Penner, MD, PhD  Visit: Dose Optimization Visits 1  Source Version:1      Patient: Sandra Underwood  Date: 10Oct2024  DOB: Dec 05, 2000    Checklist:    X AE Review   X Concomitant Medications Review   N/A Vital Signs for in-person visits (V4,V8)   N/A Pregnancy Test  for in-person visits (V4,V8)   N/A Supply dispensing as needed (V4,V8)   X Hypoglycemic Symptoms Log Review (see provider note)   X CGM Data Sufficiency Review (see provider note)   X Insulin Dose and Meal Timing Review (see provider note)   X Clarity Reviews (fasting glucose, 2-hour post prandial blood glucose, meal dose, compliance) (see provider note)   X Insulin updates (see provider note)       Adverse Events  Has the subject experienced any Adverse Events since signing the Informed Consent?   []   Yes (If 'Yes', please complete an Adverse Events Form)  [x]   No    Has the subject taken any new medications or changed any medication usage since the last visit? No.(If ???Yes???, please complete a Concomitant Medications Form)   Medication Name  (Generic / Trade Name) Dose and Frequency Start Date  (dd/mmm/yyyy) Stop Date  (dd/mmm/yyyy) Class of Medication  Primary Indication             Hypoglycemic Symptoms Log     *Note that paper hypoglycemic symptoms log should be collected at in -person visits and data entered by site staff at that time. Please remind subject to bring the paper hypoglycemic symptoms log to the next in-person visit.      Was the Hypoglycemic Symptoms Log reviewed?   No. Reason: not in person.   Were any Hypoglycemic Symptoms experienced?   Yes. Reason: pt did not report any however did mention she had a few on CGM data, patient will go back and document hypos.     CGM DATA SUFFICIENCY REVIEW    The ???Days with CGM Data??? value is located in the Clarity Software on the Subject???s Data page, on the right side of the screen. Please enter the % value noted for ???Days with CGM Data??? for the Date Range selected. Review the Clarity Software for date ranges with a value of >= 80%.       Days with CGM Data  100 %    Reminder:  There are TWO PARTS to these visits where insulin doses are titrated:    Titration Part 1 is recording data out of Clarity, reviewing it, and following both basal and bolus algorithms for titration.  This should be completed prior to the titration discussion with the subject.        Titration Part 2 is the visit with the patient, during which the titration recommendations are reviewed and implemented.        Note: These titration visits will only be completed during the Dose Optimization Period of the Study (not during the Maintenance Period, unless necessary and requested or indicated by Investigator).        Throughout the entirety of the study, including during run-in, Subjects should be recording the following Event information in real time into the G7  Phone App:   All Insulin doses (basal and bolus)   All MAIN Meal times, including carbohydrate value of meal. This is typically Breakfast, Lunch, and Dinner, all meals >15g of carbohydrate but should include at least 2 Main Meals per day   Additionally, all ???feeling low??? events must be recorded in the Dexcom G7 App and recorded in the hypoglycemic symptoms log.    Please note that subject payment will be tied to completeness of data, and please also note that if a Subject tries to back-enter/back-fill information at a later date, the date/time stamps will not be correct in the system (they are not editable), and the data will not be usable.      Please remind the Subject not to change their carb to insulin ratios or basal insulin doses unless instructed to do so by the Investigator.   Vital signs (V4,V8)  N/A    Labs(V4,V8)    N/A    Other      Scheduling    N/A    Martin Majestic, CMA

## 2023-08-13 DIAGNOSIS — N926 Irregular menstruation, unspecified: Principal | ICD-10-CM

## 2023-08-13 DIAGNOSIS — E1065 Type 1 diabetes mellitus with hyperglycemia: Principal | ICD-10-CM

## 2023-08-13 MED ORDER — PEN NEEDLE, DIABETIC 32 GAUGE X 5/32" (4 MM)
12 refills | 0 days | Status: CP
Start: 2023-08-13 — End: ?

## 2023-08-13 MED ORDER — CETIRIZINE 10 MG TABLET
ORAL_TABLET | Freq: Two times a day (BID) | ORAL | 5 refills | 30.00000 days
Start: 2023-08-13 — End: ?

## 2023-08-13 MED ORDER — NORETHINDRONE ACETATE 1 MG-ETHINYL ESTRADIOL 20 MCG TABLET
ORAL_TABLET | Freq: Every day | ORAL | 1 refills | 84 days
Start: 2023-08-13 — End: ?

## 2023-08-13 NOTE — Unmapped (Signed)
[facility] Appointment Note  Study:  OPTI-2  Description: A Phase 2b Randomized, Double-blind Trial Comparing HDVInsulin Lispro Versus Insulin Lispro Alone in Adults with Type 1 Diabetes Receiving Insulin Degludec   Protocol:  DP 11-2021-01   Site Number: 05  [place] IRB#: [id number]  PI: Sandra Penner, MD, PhD  Visit: 1  Source Version:1      Patient: Sandra Underwood  Date: 08/13/23    DOB: 09-11-2001    A large portion of this work will be completed and reviewed by the healthcare provider prior to the visit by using the data reviewed in the Clarity software.        Was the Subject???s data reviewed in the Clarity Software prior to Scheduled Visit Date?  []   Yes Date:  [x]   No     Was the Hypoglycemic Symptoms Log discussed with the subject? *Note that paper hypoglycemic symptoms log should be collected at in -person visits and data entered by site staff at that time. Please remind subject to bring the paper hypoglycemic symptoms log to the next in-person visit.      [x]   Yes   []   No    TITRATION PART 1: INSULIN DOSING AND MEALTIME REVIEW      Recording Event Data from Software into Source Documentation      Reminder: a MAIN MEAL is any meal that is >15g of carbohydrate, and NOT a ???snack??? meal or bolus associated with a snack or snack correction. In the table below, record the basal and bolus insulin doses, meal times, and carbohydrate values for the ???most complete??? 3 days leading into the visit.    Please remind the Subject not to change their carb to insulin ratios or basal insulin doses unless instructed to do so by the Investigator.     During the Visit, confirm with the Subject that the 3 days recorded are a complete record for each day (including insulin doses, times, and insulin types, as well as ???meals???/carb events--quantity of carbohydrates and time).   Note: Site staff to use best judgment as to whether data are complete.        Note: Refer to Clarity Guide for Information on Collecting Fasting and Post-Meal Glucose Values.  All Clarity-recorded glucose values used for titration should be screenshotted, initialed, and filed in the subject binder.      BASAL DOSING REVIEW AND TITRATION    Subject Basal Dose from Previous Week    This value was recommended by the PI in the previous visit.  Please check the Clarity Software to verify that basal dose is consistent and enter the Subject???s basal dose for last week below. Please note that the subject should not be changing their basal doses day-to-day, so the dose should be the same for all recorded days.    Subject Basal Dose from Previous Week (Week leading into visit)  25U        Record Basal Insulin Doses Leading into Visit in Table Below:   BASAL INSULIN DOSES (Week leading into visit)        Median Basal Dose for 3 days Leading Into Visit      Day 1  Day 2  Day 3      Date  08Oct2024     (dd/mmm/yyyy)  07Oct2024   (dd/mmm/yyyy)  06Oct2024   (dd/mmm/yyyy)  N/A    Basal Dose   Time of Day     2040  (24 hour-clock)  2107  (  24 hour-clock)  2152  (24 hour-clock)  N/A     Value (Units)  25 25 25 25         Identify the Subject???s Median Fasting Blood Glucose Leading into Titration Visit:    The Median Fasting Blood Glucose Value is the middle number in a set of three values.?To find these values, review the Subject???s daily glucose plots in provided software and find the lowest fasting (pre-breakfast) glucose value for the three days within the week leading up to the visit/call day. Please refer to the software training documentation provided for further detail on where exactly to find these values.    Fasting glucose is defined as the lowest CGM value observed between 2:00 AM and start of the breakfast meal or first meal, with at least 8 hours of fasting.  Enter the three values below and sort in order of lowest to highest to determine the Median Fasting Blood Glucose Value prior to making basal dosing recommendation changes.    Fasting Blood Glucose Value Date mg/dl Time of recorded FBG   1 09Oct2024 192 0658   2 08Oct2024 82 0708   3 07Oct2024 42 1253   Please identify the Median of these Values by ordering them from low to high and selecting the middle value (see example below):    Median Fasting Blood Glucose Value   82 mg/dl      Use the median Fasting Blood Glucose from above and the algorithm below to determine the subject???s new basal dose, with the goal of achieving a fasting blood glucose between 80mg /dL and 161 mg/dL.     Algorithm for adjusting basal insulin Degludec    3-Day median fasting    glucose level  Adjustment    to insulin    Degludec  Amount    <80 mg/dL or had an unexplained episode of Level 2 fasting or pre-meal hypoglycemia (i.e., <54 mg/dL) occurs   Reduce by  2 U    80-110 mg/dL  No Change      096-045 mg/dL  Increase by  2 U    409-811 mg/dL  Increase by  4 U    914-782 mg/dL  Increase by  6 U    >956 mg/dL  Increase by  8 U      Degludec Protocol Dosing Recommendation (mark one)   (Please review dosing algorithm table above)    Decreased by 2 units [x]      No Change               []    Increased by 2 units []    Increased by 4 units []    Increased by 6 units []    Increased by 8 units []     New Basal Dose for Subject:                                      Given such variable control per review of CGM but recent hyperglycemia, Sandra Underwood reported increasing to 30U last night. Given her hypoglycemia (42 on October 7), I asked her to reduce back to 28, though she preferred to remain over 25U given hyperglycemia. Discussed that managing prandial excursions should also help prevent lows, since she will not need to correct.    If Investigator chose to deviate from protocol recommendation, please indicate why:       Subject had a documented Level 2 Hypoglycemia (<54 mg/dL) in  the last 7 days    []      Subject frequency of Level 1 Hypoglycemia (70mg /dL) has increased in the last 7 days    []    Subject Unwilling             []     Investigator Discretion           [x] Explain: Basal dose appears appropriate when blood sugars are steady, as in the 25 unit keeps blood sugars flat. Variability appears to be due excursions, which require adjustment of bolus insulin.      Record Bolus Insulin Doses and Mealtime Carbohydrate Values Leading into Visit in Table Below:       Day 1  Day 2 Day 3  Did Subject Correctly Calculate and Follow Carb to Insulin Dosing Guidance? (comment for each meal)     Date  05Oct2024  (dd/mmm/yyyy)  04Oct2024  (dd/mmm/yyyy)  03Oct2024   (dd/mmm/yyyy)  N/A    Breakfast Bolus Dose   (Carb to Insulin Ratio from previous week)   _________  Time of Day of Breakfast Bolus 0849  (24 hour-clock)  0740  (24 hour-clock)  0736  (24 hour-clock)   Variable insulin based on noted CHO ratio, but close to target.     Bolus Value (Units)  12 7 4      Meal Time  0848  (24 hour-clock)  0739  (24 hour-clock)  0731  (24 hour-clock)      Meal Carb Value (g) 115 54 32      Carb:Insulin Ratio 9.6 7.7 8     Lunch Bolus Dose    (Carb to Insulin Ratio from previous week)   _________  Time of Day of Lunch Bolus N/A no lunch  (24 hour-clock)  1424  (24 hour-clock)  1310  (24 hour-clock)  Close to target CHO ratio.    Bolus Value (Units)  0 18 12     Meal Time  N/A no lunch  (24 hour-clock)  1424  (24 hour-clock)  1310  (24 hour-clock)      Meal Carb Value (g) 0 137 102      Current Carb:Insulin Ratio  0  7.6  8.5    Dinner Bolus Dose    (Carb to Insulin Ratio from previous week)   _________  Time of Day of Dinner Bolus 2009  (24 hour-clock)  1940   (24 hour-clock)  1737  (24 hour-clock)  Tighter CHO ratio than listed     Bolus Value (Units)  15 16 14      Meal Time  1958  (24 hour-clock)  1940  (24 hour-clock)  1737  (24 hour-clock)      Meal Carb Value (g) 100 124 84     Current Carb: Insulin Ratio   6.7  7.75  6       Identify the Subject???s 2-Hour Postprandial Glucose Values Leading into Titration Visit:      Note: To find the Median 2-hour Postprandial  Glucose (PPG) for a Meal, you must find the glucose value that occurs 2 hours after the mealtime stamp entered by the subject.        ZOX:WRUEAVW RATIO BOLUS CALCULATIONS            Day 1  Day 2  Day 3    Date (most complete previous 3 days)  05Oct2024 (dd/mmm/yyyy)  04Oct2024  (dd/mmm/yyyy)  03Oct2024 (dd/mmm/yyyy)    Breakfast 2-Hour Postprandial Glucose    Glucose Value   90 91 164  PPG TIme  1046  (24 hour-clock)  0941   (24 hour-clock)  0931  (24 hour-clock)    Lunch 2-Hour Postprandial Glucose       Glucose Value   N/A no lunch 254 231    PPG Time     N/A   (24 hour-clock)  1926   (24 hour-clock)  1511  (24 hour-clock)    Dinner 2-Hour Postprandial Glucose       Glucose Value  188 60 154    PPG Time     2158  (24 hour-clock)  2141  (24 hour-clock)  1936   (24 hour-cloc          Subject CARB:INSULIN RATIOS used from previous week:       Breakfast Lunch Dinner   Median 2-hour PPG (previous week) 207 84 187   Median 2-hour PPG from week leading into Visit 91 242.5(mean) 154   Carb:Insulin Ratios used from Previous Visit 8 8 8    New Carb:Insulin Ratios 8 6 7      Calculated Last Week           Recommended Last Week        Current:       Reported by Participant       Calculated this Week       Recommended this Week              If Investigator chose to deviate from protocol recommendation, please indicate why:    Subject had a documented Level 2 Hypoglycemia (<54 mg/dL) in the last 7 days          []    Subject frequency of Level 1 Hypoglycemia (70mg /dL) has increased in the last 7 days        []    Subject Unwilling                     []    Investigator Discretion           [x]  Explain:     After discussion with Rikiyah, she was frustrated that her blood sugars have been high all day long the last two days. Last night, she increased her long acting insulin back to 30 units. Discussed that I am concerned that will lead to hypoglycemia, but she was nervous about having persistent highs. I requested that she reduce back to 28 units. We discussed that her prandial excursions are leading to her variability (hyper and then hypoglycemia). We therefore increased her carb ratios, but Zilda and myself were nervous about following the very intensive algorithm and leading to hypoglycemia. We therefore followed a slightly more conservative algorithm, reducing lunch from 1:8 to 1:6, and dinner from 1:8 to 1:7. She was given my cell phone number to contact me with concerns. Otherwise, we will adjust again next week.     BOLUS INSULIN ADJUSTMENT PROTOCOL:    GOAL IS 2-HOUR POST-PRANDIAL BLOOD SUGAR < 140mg /dL. Titration detail is provided below:      The following Table will be used to determine any needed change in Carb to Insulin ratio.   In the table, the value of the ratio currently in use is given as ???Xcurrent???, and the adjusted value (determined based on 2-   hour postprandial glucose values, assessed by inspection of CGM profiles) is given as ???Xnew???.       NOTE: the decision to change the Carb to Insulin ratio should be based on three or more days??? worth of data for  each meal in question, using the MEDIAN 2-hour post prandial glucose (not the MEAN).      Bolus Insulin Dosing Algorithm:   Algorithm for Adjusting Bolus Insulin     2-hour Postprandial Glucose (MEDIAN over 3 days)  1 Unit Insulin per   X gram Carbs    <100 mg/dL  Xnew = 1.6 of Xcurrent    100-140 mg/dL  Xnew = Xcurrent    413-244 mg/dL  Xnew = 0.8 of Xcurrent    160-200 mg/dL  Xnew = 0.7 of Xcurrent    >200 mg/dL  Xnew = 0.5 of Xcurrent           I have reviewed the information collected in the source data sheet as it applies to the Subject???s dosing titrations. The titrations have been reviewed with the subject, and I recommend the above dosing titration to be made.         Missy Sabins, MD    This progress note may be timestamped after permission was given to the coordinator to proceed. Eligibility was determined by me, the investigator, prior to moving forward.

## 2023-08-13 NOTE — Unmapped (Signed)
Patient is requesting the following refill  Requested Prescriptions     Pending Prescriptions Disp Refills    norethindrone-ethinyl estradiol (JUNEL 1/20, 21,) 1-20 mg-mcg per tablet 84 tablet 1     Sig: Take 1 tablet by mouth daily.       Recent Visits  Date Type Provider Dept   12/24/22 Office Visit Coralee North, Magnus Ivan, FNP [facility] [address] [facility]   09/22/22 Office Visit Clapp, Magnus Ivan, FNP [facility] [address] [facility]   08/15/22 Office Visit Clapp, Magnus Ivan, FNP [facility] [address] [facility]   Showing recent visits within past 365 days and meeting all other requirements  Future Appointments  Date Type Provider Dept   09/17/23 Appointment Amie Portland, FNP [facility] [address] [facility]   Showing future appointments within next 365 days and meeting all other requirements       [facility]/23 for 6 month supply.  Renn wants to know if cheaper at [facility].  She is aware she will need to check on cost.  She states she has moved and trying to get RX's at same place.  For now [facility] [place].  She can then have RX transferred after she checks into cost.

## 2023-08-14 DIAGNOSIS — N926 Irregular menstruation, unspecified: Principal | ICD-10-CM

## 2023-08-14 MED ORDER — NORETHINDRONE ACETATE 1 MG-ETHINYL ESTRADIOL 20 MCG TABLET
ORAL_TABLET | Freq: Every day | ORAL | 1 refills | 84 days
Start: 2023-08-14 — End: ?

## 2023-08-14 NOTE — Unmapped (Signed)
I was immediately available via phone/pager or present on site.  I reviewed and discussed the case with the resident, but did not see the patient.  I agree with the assessment and plan as documented in the resident's note. Armstead Peaks, MD

## 2023-08-14 NOTE — Unmapped (Signed)
[facility] Refill Coordination Note    Specialty Medication(s) to be Shipped:   CF/Pulmonary/Asthma: tacrolimus 1 MG capsule (PROGRAF)    Other medication(s) to be shipped:  cetirizine, Ulticare pen needles, levothyroxine     Sandra Underwood, DOB: 18-Apr-2001  Phone: [phone] (home)       All above HIPAA information was verified with patient.     Was a Nurse, learning disability used for this call? No    Completed refill call assessment today to schedule patient's medication shipment from the [facility]  [phone]).  All relevant notes have been reviewed.     Specialty medication(s) and dose(s) confirmed: Regimen is correct and unchanged.   Changes to medications: Challis reports no changes at this time.  Changes to insurance: No  New side effects reported not previously addressed with a pharmacist or physician: None reported  Questions for the pharmacist: No    Confirmed patient received a Conservation officer, historic buildings and a Surveyor, mining with first shipment. The patient will receive a drug information handout for each medication shipped and additional FDA Medication Guides as required.       DISEASE/MEDICATION-SPECIFIC INFORMATION        N/A    SPECIALTY MEDICATION ADHERENCE     Medication Adherence    Patient reported X missed doses in the last month: 0  Specialty Medication: tacrolimus 1 MG capsule (PROGRAF)  Patient is on additional specialty medications: No  Patient is on more than two specialty medications: No  Any gaps in refill history greater than 2 weeks in the last 3 months: no  Demonstrates understanding of importance of adherence: yes  Informant: patient  Reliability of informant: reliable  Provider-estimated medication adherence level: good  Patient is at risk for Non-Adherence: No  Reasons for non-adherence: no problems identified              Were doses missed due to medication being on hold? No    tacrolimus 1 MG capsule (PROGRAF)  : 7 days of medicine on hand       REFERRAL TO PHARMACIST     Referral to the pharmacist: Not needed      [facility]     Shipping address confirmed in Epic.       Delivery Scheduled: Yes, Expected medication delivery date: 08/17/23.     Medication will be delivered via UPS to the prescription address in Epic WAM.    [facility]  Specialty Technician

## 2023-08-15 MED ORDER — NORETHINDRONE ACETATE 1 MG-ETHINYL ESTRADIOL 20 MCG TABLET
ORAL_TABLET | Freq: Every day | ORAL | 1 refills | 84 days | Status: CP
Start: 2023-08-15 — End: ?
  Filled 2023-08-26: qty 84, 84d supply, fill #0

## 2023-08-19 NOTE — Unmapped (Addendum)
 [facility] Appointment Note  Study:  OPTI-2  Description: A Phase 2b Randomized, Double-blind Trial Comparing HDVInsulin Lispro Versus Insulin Lispro Alone in Adults with Type 1 Diabetes Receiving Insulin Degludec   Protocol:  DP 11-2021-01   Site Number: 05  [place] IRB#: [id number]  PI: Noelle Penner, MD, PhD  Visit: 2  Source Version:1      Patient: Sandra Underwood  Date: 17Oct2024  DOB: 2001/06/05    A large portion of this work will be completed and reviewed by the healthcare provider prior to the visit by using the data reviewed in the Clarity software.        Was the Subject???s data reviewed in the Clarity Software prior to Scheduled Visit Date?  [x]   Yes Date:16Oct2024  []   No     Was the Hypoglycemic Symptoms Log discussed with the subject? *Note that paper hypoglycemic symptoms log should be collected at in -person visits and data entered by site staff at that time. Please remind subject to bring the paper hypoglycemic symptoms log to the next in-person visit.      [x]   Yes   []   No    TITRATION PART 1: INSULIN DOSING AND MEALTIME REVIEW      Recording Event Data from Software into Source Documentation      Reminder: a MAIN MEAL is any meal that is >15g of carbohydrate, and NOT a ???snack??? meal or bolus associated with a snack or snack correction. In the table below, record the basal and bolus insulin doses, meal times, and carbohydrate values for the ???most complete??? 3 days leading into the visit.    Please remind the Subject not to change their carb to insulin ratios or basal insulin doses unless instructed to do so by the Investigator.     During the Visit, confirm with the Subject that the 3 days recorded are a complete record for each day (including insulin doses, times, and insulin types, as well as ???meals???/carb events--quantity of carbohydrates and time).   Note: Site staff to use best judgment as to whether data are complete.        Note: Refer to Clarity Guide for Information on Collecting Fasting and Post-Meal Glucose Values.  All Clarity-recorded glucose values used for titration should be screenshotted, initialed, and filed in the subject binder.      BASAL DOSING REVIEW AND TITRATION    Subject Basal Dose from Previous Week    This value was recommended by the PI in the previous visit.  Please check the Clarity Software to verify that basal dose is consistent and enter the Subject???s basal dose for last week below. Please note that the subject should not be changing their basal doses day-to-day, so the dose should be the same for all recorded days.    Subject Basal Dose from Previous Week (Week leading into visit)  28U        Record Basal Insulin Doses Leading into Visit in Table Below:   BASAL INSULIN DOSES (Week leading into visit)        Median Basal Dose for 3 days Leading Into Visit      Day 1  Day 2  Day 3      Date  15Oct2024     (dd/mmm/yyyy)  14Oct2024   (dd/mmm/yyyy)  12Oct2024 (not recorded on 13Oct2024)  (dd/mmm/yyyy)  N/A    Basal Dose   Time of Day     2048  (24 hour-clock)  2145  (  24 hour-clock)  2145  (24 hour-clock)  N/A     Value (Units)  28 28 28 28         Identify the Subject???s Median Fasting Blood Glucose Leading into Titration Visit:    The Median Fasting Blood Glucose Value is the middle number in a set of three values.?To find these values, review the Subject???s daily glucose plots in provided software and find the lowest fasting (pre-breakfast) glucose value for the three days within the week leading up to the visit/call day. Please refer to the software training documentation provided for further detail on where exactly to find these values.    Fasting glucose is defined as the lowest CGM value observed between 2:00 AM and start of the breakfast meal or first meal, with at least 8 hours of fasting.  Enter the three values below and sort in order of lowest to highest to determine the Median Fasting Blood Glucose Value prior to making basal dosing recommendation changes.    Fasting Blood Glucose Value Date mg/dl Time of recorded FBG   1 15Oct2024 112 0728   2 14Oct2024 62 0448   3 13Oct2024 109 0918   Please identify the Median of these Values by ordering them from low to high and selecting the middle value (see example below):    Median Fasting Blood Glucose Value   109 mg/dl      Use the median Fasting Blood Glucose from above and the algorithm below to determine the subject???s new basal dose, with the goal of achieving a fasting blood glucose between 80mg /dL and 161 mg/dL.     Algorithm for adjusting basal insulin Degludec    3-Day median fasting    glucose level  Adjustment    to insulin    Degludec  Amount    <80 mg/dL or had an unexplained episode of Level 2 fasting or pre-meal hypoglycemia (i.e., <54 mg/dL) occurs   Reduce by  2 U    80-110 mg/dL  No Change      096-045 mg/dL  Increase by  2 U    409-811 mg/dL  Increase by  4 U    914-782 mg/dL  Increase by  6 U    >956 mg/dL  Increase by  8 U      Degludec Protocol Dosing Recommendation (mark one)   (Please review dosing algorithm table above)    Decreased by 2 units []      No Change               []    Increased by 2 units []    Increased by 4 units []    Increased by 6 units []    Increased by 8 units []     New Basal Dose for Subject:                                      see provider note U      If Investigator chose to deviate from protocol recommendation, please indicate why:       Subject had a documented Level 2 Hypoglycemia (<54 mg/dL) in the last 7 days    []      Subject frequency of Level 1 Hypoglycemia (70mg /dL) has increased in the last 7 days    []    Subject Unwilling             []     Investigator Discretion           []   Explain:       Record Bolus Insulin Doses and Mealtime Carbohydrate Values Leading into Visit in Table Below:     Day 1  Day 2  Day 3  Did Subject Correctly Calculate and Follow Carb to Insulin Dosing Guidance? (comment for each meal)     Date  15Oct2024  (dd/mmm/yyyy) 14Oct2024  (dd/mmm/yyyy)  13Oct2024   (dd/mmm/yyyy)  N/A    Breakfast Bolus Dose   (Carb to Insulin Ratio from previous week)   _________  Time of Day of Breakfast Bolus 0855  (24 hour-clock)  0744  (24 hour-clock)  1016  (24 hour-clock)   see provider note    Bolus Value (Units)  9 10 7      Meal Time  0855  (24 hour-clock)  0744  (24 hour-clock)  0950  (24 hour-clock)      Meal Carb Value (g) 72 88 52     Current Carb:Insulin Ratio 8 8.8 7.4    Lunch Bolus Dose    (Carb to Insulin Ratio from previous week)   _________  Time of Day of Lunch Bolus 1414  (24 hour-clock)  1221  (24 hour-clock)  N/A no lunch  (24 hour-clock)  see provider note     Bolus Value (Units)  16 9 0     Meal Time  57846  (24 hour-clock)  1221  (24 hour-clock)  N/A no lunch  (24 hour-clock)      Meal Carb Value (g) 63 47 0      Current Carb:Insulin Ratio  3.9  5.2  0    Dinner Bolus Dose    (Carb to Insulin Ratio from previous week)   _________  Time of Day of Dinner Bolus 2015  (24 hour-clock)  1854   (24 hour-clock)  1838  (24 hour-clock)  see provider note     Bolus Value (Units)  5 12 11      Meal Time  2015  (24 hour-clock)  1853  (24 hour-clock)  1838  (24 hour-clock)      Meal Carb Value (g) 35 82 78     Current Carb: Insulin Ratio   7  6.8  7.1       Identify the Subject???s 2-Hour Postprandial Glucose Values Leading into Titration Visit:      Note: To find the Median 2-hour Postprandial  Glucose (PPG) for a Meal, you must find the glucose value that occurs 2 hours after the mealtime stamp entered by the subject.        CARB:INSULIN RATIO BOLUS CALCULATIONS            Day 1  Day 2  Day 3    Date (most complete previous 3 days)  15Oct2024 (dd/mmm/yyyy)  14Oct2024  (dd/mmm/yyyy)  13Oct2024 (dd/mmm/yyyy)    Breakfast 2-Hour Postprandial Glucose    Glucose Value   97 134 154    PPG TIme  1053  (24 hour-clock)  0943   (24 hour-clock)  1148  (24 hour-clock)    Lunch 2-Hour Postprandial Glucose       Glucose Value   128 219 N/A no lunch    PPG Time     1703   (24 hour-clock)  1323   (24 hour-clock)  N/A no lunch  (24 hour-clock)    Dinner 2-Hour Postprandial Glucose       Glucose Value  266 201 188    PPG Time     2213  (24 hour-clock)  2053  (24  hour-clock)  2038   (24 hour-clock)           Subject CARB:INSULIN RATIOS used from previous week:      Breakfast Lunch Dinner   Median 2-hour PPG (previous week) 205 269 100   Median 2-hour PPG from week leading into Visit 134 174 201   Carb:Insulin Ratios used from Previous Visit 8 6 7    New Carb:Insulin Ratios see provider note see provider note see provider note     Calculated Last Week           Recommended Last Week        Current:       Reported by Participant       Calculated this Week       Recommended this Week              If Investigator chose to deviate from protocol recommendation, please indicate why:    Subject had a documented Level 2 Hypoglycemia (<54 mg/dL) in the last 7 days          []    Subject frequency of Level 1 Hypoglycemia (70mg /dL) has increased in the last 7 days        []    Subject Unwilling                     []    Investigator Discretion           []  Explain:      BOLUS INSULIN ADJUSTMENT PROTOCOL:    GOAL IS 2-HOUR POST-PRANDIAL BLOOD SUGAR < 140mg /dL. Titration detail is provided below:      The following Table will be used to determine any needed change in Carb to Insulin ratio.   In the table, the value of the ratio currently in use is given as ???Xcurrent???, and the adjusted value (determined based on 2-   hour postprandial glucose values, assessed by inspection of CGM profiles) is given as ???Xnew???.       NOTE: the decision to change the Carb to Insulin ratio should be based on three or more days??? worth of data for each meal in question, using the MEDIAN 2-hour post prandial glucose (not the MEAN).      Bolus Insulin Dosing Algorithm:   Algorithm for Adjusting Bolus Insulin     2-hour Postprandial Glucose (MEDIAN over 3 days)  1 Unit Insulin per   X gram Carbs    <100 mg/dL  Xnew = 1.6 of Xcurrent    100-140 mg/dL  Xnew = Xcurrent    098-119 mg/dL  Xnew = 0.8 of Xcurrent    160-200 mg/dL  Xnew = 0.7 of Xcurrent    >200 mg/dL  Xnew = 0.5 of Xcurrent

## 2023-08-19 NOTE — Unmapped (Signed)
[facility] Appointment Note  Study:  OPTI-2  Description: A Phase 2b Randomized, Double-blind Trial Comparing HDVInsulin Lispro Versus Insulin Lispro Alone in Adults with Type 1 Diabetes Receiving Insulin Degludec   Protocol:  DP 11-2021-01   Site Number: 05  [place] IRB#: [id number]  PI: Noelle Penner, MD, PhD  Visit: Dose Optimization Visits 2  Source Version:1      Patient: Sandra Underwood  Date: 17OCt2024  DOB: [birth year]    Checklist:    X AE Review   X Concomitant Medications Review   N/A Vital Signs for in-person visits (V4,V8)   N/A Pregnancy Test  for in-person visits (V4,V8)   N/A Supply dispensing as needed (V4,V8)   X Hypoglycemic Symptoms Log Review (see provider note)   X CGM Data Sufficiency Review (see provider note)   X Insulin Dose and Meal Timing Review (see provider note)   X Clarity Reviews (fasting glucose, 2-hour post prandial blood glucose, meal dose, compliance) (see provider note)   X Insulin updates (see provider note)       Adverse Events  Has the subject experienced any Adverse Events since signing the Informed Consent?   []   Yes (If 'Yes', please complete an Adverse Events Form)  [x]   No    Has the subject taken any new medications or changed any medication usage since the last visit? No.(If ???Yes???, please complete a Concomitant Medications Form)   Medication Name  (Generic / Trade Name) Dose and Frequency Start Date  (dd/mmm/yyyy) Stop Date  (dd/mmm/yyyy) Class of Medication  Primary Indication             Hypoglycemic Symptoms Log     *Note that paper hypoglycemic symptoms log should be collected at in -person visits and data entered by site staff at that time. Please remind subject to bring the paper hypoglycemic symptoms log to the next in-person visit.      Was the Hypoglycemic Symptoms Log reviewed?   No. Reason: not in person.   Were any Hypoglycemic Symptoms experienced?   No.     CGM DATA SUFFICIENCY REVIEW    The ???Days with CGM Data??? value is located in the Clarity Software on the Subject???s Data page, on the right side of the screen. Please enter the % value noted for ???Days with CGM Data??? for the Date Range selected. Review the Clarity Software for date ranges with a value of >= 80%.       Days with CGM Data  93 %    Reminder:  There are TWO PARTS to these visits where insulin doses are titrated:    Titration Part 1 is recording data out of Clarity, reviewing it, and following both basal and bolus algorithms for titration.  This should be completed prior to the titration discussion with the subject.        Titration Part 2 is the visit with the patient, during which the titration recommendations are reviewed and implemented.        Note: These titration visits will only be completed during the Dose Optimization Period of the Study (not during the Maintenance Period, unless necessary and requested or indicated by Investigator).        Throughout the entirety of the study, including during run-in, Subjects should be recording the following Event information in real time into the G7 Phone App:   All Insulin doses (basal and bolus)   All MAIN Meal times, including carbohydrate value of meal. This is  typically Breakfast, Lunch, and Dinner, all meals >15g of carbohydrate but should include at least 2 Main Meals per day   Additionally, all ???feeling low??? events must be recorded in the Dexcom G7 App and recorded in the hypoglycemic symptoms log.    Please note that subject payment will be tied to completeness of data, and please also note that if a Subject tries to back-enter/back-fill information at a later date, the date/time stamps will not be correct in the system (they are not editable), and the data will not be usable.      Please remind the Subject not to change their carb to insulin ratios or basal insulin doses unless instructed to do so by the Investigator.         Martin Majestic, CMA

## 2023-08-20 NOTE — Unmapped (Signed)
[place] [facility] Appointment Note  Study:  OPTI-2  Description: A Phase 2b Randomized, Double-blind Trial Comparing HDVInsulin Lispro Versus Insulin Lispro Alone in Adults with Type 1 Diabetes Receiving Insulin Degludec   Protocol:  DP 11-2021-01   Site Number: 05  [place] IRB#: [id number]  PI: Noelle Penner, MD, PhD  Visit: 2  Source Version:1      Patient: Sandra Underwood  Date: 08/20/23  DOB: 2001-03-03    A large portion of this work will be completed and reviewed by the healthcare provider prior to the visit by using the data reviewed in the Clarity software.        Was the Subject???s data reviewed in the Clarity Software prior to Scheduled Visit Date?  [x]   Yes Date: Reviewed by [facility] 16Oct2024  []   No     Was the Hypoglycemic Symptoms Log discussed with the subject? *Note that paper hypoglycemic symptoms log should be collected at in -person visits and data entered by site staff at that time. Please remind subject to bring the paper hypoglycemic symptoms log to the next in-person visit.      [x]   Yes   []   No    TITRATION PART 1: INSULIN DOSING AND MEALTIME REVIEW      Recording Event Data from Software into Source Documentation      Reminder: a MAIN MEAL is any meal that is >15g of carbohydrate, and NOT a ???snack??? meal or bolus associated with a snack or snack correction. In the table below, record the basal and bolus insulin doses, meal times, and carbohydrate values for the ???most complete??? 3 days leading into the visit.    Please remind the Subject not to change their carb to insulin ratios or basal insulin doses unless instructed to do so by the Investigator.     During the Visit, confirm with the Subject that the 3 days recorded are a complete record for each day (including insulin doses, times, and insulin types, as well as ???meals???/carb events--quantity of carbohydrates and time).   Note: Site staff to use best judgment as to whether data are complete.        Note: Refer to Clarity Guide for Information on Collecting Fasting and Post-Meal Glucose Values.  All Clarity-recorded glucose values used for titration should be screenshotted, initialed, and filed in the subject binder.      BASAL DOSING REVIEW AND TITRATION    Subject Basal Dose from Previous Week    This value was recommended by the PI in the previous visit.  Please check the Clarity Software to verify that basal dose is consistent and enter the Subject???s basal dose for last week below. Please note that the subject should not be changing their basal doses day-to-day, so the dose should be the same for all recorded days.    Subject Basal Dose from Previous Week (Week leading into visit)  28U        Record Basal Insulin Doses Leading into Visit in Table Below:   BASAL INSULIN DOSES (Week leading into visit)        Median Basal Dose for 3 days Leading Into Visit      Day 1  Day 2  Day 3      Date  15Oct2024     (dd/mmm/yyyy)  14Oct2024   (dd/mmm/yyyy)  12Oct2024 (not recorded on 13Oct2024)  (dd/mmm/yyyy)  N/A    Basal Dose   Time of Day     2048  (  24 hour-clock)  2145  (24 hour-clock)  2145  (24 hour-clock)  N/A     Value (Units)  28 28 28 28             Identify the Subject???s Median Fasting Blood Glucose Leading into Titration Visit:    The Median Fasting Blood Glucose Value is the middle number in a set of three values.?To find these values, review the Subject???s daily glucose plots in provided software and find the lowest fasting (pre-breakfast) glucose value for the three days within the week leading up to the visit/call day. Please refer to the software training documentation provided for further detail on where exactly to find these values.    Fasting glucose is defined as the lowest CGM value observed between 2:00 AM and start of the breakfast meal or first meal, with at least 8 hours of fasting.  Enter the three values below and sort in order of lowest to highest to determine the Median Fasting Blood Glucose Value prior to making basal dosing recommendation changes.    Fasting Blood Glucose Value Date mg/dl Time of recorded FBG   1 15Oct2024 112 0728   2 14Oct2024 62 0448   3 13Oct2024 109 0918   Please identify the Median of these Values by ordering them from low to high and selecting the middle value (see example below):    Median Fasting Blood Glucose Value   109 mg/dl        Use the median Fasting Blood Glucose from above and the algorithm below to determine the subject???s new basal dose, with the goal of achieving a fasting blood glucose between 80mg /dL and 829 mg/dL.     Algorithm for adjusting basal insulin Degludec    3-Day median fasting    glucose level  Adjustment    to insulin    Degludec  Amount    <80 mg/dL or had an unexplained episode of Level 2 fasting or pre-meal hypoglycemia (i.e., <54 mg/dL) occurs   Reduce by  2 U    80-110 mg/dL  No Change      562-130 mg/dL  Increase by  2 U    865-784 mg/dL  Increase by  4 U    696-295 mg/dL  Increase by  6 U    >284 mg/dL  Increase by  8 U      Degludec Protocol Dosing Recommendation (mark one)   (Please review dosing algorithm table above)    Decreased by 2 units []      No Change               [x]    Increased by 2 units []    Increased by 4 units []    Increased by 6 units []    Increased by 8 units []     New Basal Dose for Subject:                                      28 U      Based on review of CGM, there are days with significant overnight hyperglycemia that seems to be mediated by prandial excursions. When her blood sugar is in the normal range, it appears to be reasonably flat. We mutually decided that the protocol decision is the right one, even though there is variability.    If Investigator chose to deviate from protocol recommendation, please indicate why:  N/A  Subject had a documented Level 2 Hypoglycemia (<54 mg/dL) in the last 7 days    []      Subject frequency of Level 1 Hypoglycemia (70mg /dL) has increased in the last 7 days    []    Subject Unwilling             []  Investigator Discretion           [] Explain:       Record Bolus Insulin Doses and Mealtime Carbohydrate Values Leading into Visit in Table Below:     Day 1  Day 2  Day 3  Did Subject Correctly Calculate and Follow Carb to Insulin Dosing Guidance? (comment for each meal)     Date  15Oct2024  (dd/mmm/yyyy)  14Oct2024  (dd/mmm/yyyy)  13Oct2024   (dd/mmm/yyyy)  N/A    Breakfast Bolus Dose   (Carb to Insulin Ratio from previous week)   _________  Time of Day of Breakfast Bolus 0855  (24 hour-clock)  0744  (24 hour-clock)  1016  (24 hour-clock)   She was close to the target for breakfast.    Bolus Value (Units)  9 10 7      Meal Time  0855  (24 hour-clock)  0744  (24 hour-clock)  0950  (24 hour-clock)      Meal Carb Value (g) 72 88 52      Current Carb:Insulin Ratio 8 8.8 7.4     Lunch Bolus Dose    (Carb to Insulin Ratio from previous week)   _________  Time of Day of Lunch Bolus 1414  (24 hour-clock)  1221  (24 hour-clock)  N/A no lunch  (24 hour-clock)  She overbolused for lunch - ratio is supposed to be 6, but likely mediated by      Bolus Value (Units)  16 9 0     Meal Time  62130  (24 hour-clock)  1221  (24 hour-clock)  N/A no lunch  (24 hour-clock)      Meal Carb Value (g) 63 47 0      Current Carb:Insulin Ratio  3.9  5.2  0    Dinner Bolus Dose    (Carb to Insulin Ratio from previous week)   _________  Time of Day of Dinner Bolus 2015  (24 hour-clock)  1854   (24 hour-clock)  1838  (24 hour-clock)  Carb ratio is close to goal.     Bolus Value (Units)  5 12 11      Meal Time  2015  (24 hour-clock)  1853  (24 hour-clock)  1838  (24 hour-clock)      Meal Carb Value (g) 35 82 78     Current Carb: Insulin Ratio   7  6.8  7.1        Identify the Subject???s 2-Hour Postprandial Glucose Values Leading into Titration Visit:      Note: To find the Median 2-hour Postprandial  Glucose (PPG) for a Meal, you must find the glucose value that occurs 2 hours after the mealtime stamp entered by the subject.        CARB:INSULIN RATIO BOLUS CALCULATIONS            Day 1  Day 2  Day 3    Date (most complete previous 3 days)  15Oct2024 (dd/mmm/yyyy)  14Oct2024  (dd/mmm/yyyy)  13Oct2024 (dd/mmm/yyyy)    Breakfast 2-Hour Postprandial Glucose    Glucose Value   97 134 154    PPG TIme  1053  (24 hour-clock)  0943   (  24 hour-clock)  1148  (24 hour-clock)    Lunch 2-Hour Postprandial Glucose       Glucose Value   128 139 N/A no lunch    PPG Time     1703   (24 hour-clock)  1323   (24 hour-clock)  N/A no lunch  (24 hour-clock)    Dinner 2-Hour Postprandial Glucose       Glucose Value  266 201 188    PPG Time     2213  (24 hour-clock)  2053  (24 hour-clock)  2038   (24 hour-clock)           Subject CARB:INSULIN RATIOS used from previous week:       Breakfast Lunch Dinner   Median 2-hour PPG (previous week) 91 242.5(mean) 154   Median 2-hour PPG from week leading into Visit 134 133.5 201   Carb:Insulin Ratios used from Previous Visit 8 6 7    New Carb:Insulin Ratios 8 6 5      Calculated Last Week           Recommended Last Week        Current:       Reported by Participant       Calculated this Week       Recommended this Week              If Investigator chose to deviate from protocol recommendation, please indicate why:    Subject had a documented Level 2 Hypoglycemia (<54 mg/dL) in the last 7 days          [x]    Subject frequency of Level 1 Hypoglycemia (70mg /dL) has increased in the last 7 days        [x]    Subject Unwilling                     []    Investigator Discretion           []  Explain:     Sandra Underwood has had a number of hypoglycemic episodes including over the past week. I am concerned that if we increase her evening mealtime to 3.5, she will end up having nocturnal hypoglycemia. Given her curve at the moment (where her basal appears to keep her flat, I *think* her basal is correct, but may need to be reduced.) Further her post-prandial blood sugar is so close to 200, that going up to 3.5 seems disproportionate. We will therefore use the alogrithm for 160-200 and increase to 5. She knows to be in contact with significant hypoglycemia.    BOLUS INSULIN ADJUSTMENT PROTOCOL:    GOAL IS 2-HOUR POST-PRANDIAL BLOOD SUGAR < 140mg /dL. Titration detail is provided below:      The following Table will be used to determine any needed change in Carb to Insulin ratio.   In the table, the value of the ratio currently in use is given as ???Xcurrent???, and the adjusted value (determined based on 2-   hour postprandial glucose values, assessed by inspection of CGM profiles) is given as ???Xnew???.       NOTE: the decision to change the Carb to Insulin ratio should be based on three or more days??? worth of data for each meal in question, using the MEDIAN 2-hour post prandial glucose (not the MEAN).      Bolus Insulin Dosing Algorithm:   Algorithm for Adjusting Bolus Insulin     2-hour Postprandial Glucose (MEDIAN over 3 days)  1 Unit Insulin per   X  gram Carbs    <100 mg/dL  Xnew = 1.6 of Xcurrent    100-140 mg/dL  Xnew = Xcurrent    578-469 mg/dL  Xnew = 0.8 of Xcurrent    160-200 mg/dL  Xnew = 0.7 of Xcurrent    >200 mg/dL  Xnew = 0.5 of Xcurrent           I have reviewed the information collected in the source data sheet as it applies to the Subject???s dosing titrations. The titrations have been reviewed with the subject, and I recommend the above dosing titration to be made.         Missy Sabins, MD    This progress note may be timestamped after permission was given to the coordinator to proceed. Eligibility was determined by me, the investigator, prior to moving forward.

## 2023-08-26 MED FILL — LEVOTHYROXINE 25 MCG TABLET: ORAL | 90 days supply | Qty: 90 | Fill #3

## 2023-08-26 MED FILL — ULTICARE PEN NEEDLE 32 GAUGE X 5/32" (4 MM): 25 days supply | Qty: 100 | Fill #0

## 2023-08-26 MED FILL — CETIRIZINE 10 MG TABLET: ORAL | 30 days supply | Qty: 120 | Fill #0

## 2023-08-26 MED FILL — TACROLIMUS 1 MG CAPSULE, IMMEDIATE-RELEASE: ORAL | 30 days supply | Qty: 60 | Fill #4

## 2023-08-26 NOTE — Unmapped (Addendum)
[place] [facility] Appointment Note  Study:  OPTI-2  Description: A Phase 2b Randomized, Double-blind Trial Comparing HDVInsulin Lispro Versus Insulin Lispro Alone in Adults with Type 1 Diabetes Receiving Insulin Degludec   Protocol:  DP 11-2021-01   Site Number: 05  [place] IRB#: [id number]  PI: Noelle Penner, MD, PhD  Visit: 3  Source Version:1      Patient: Sandra Underwood  Date: 24Oct2024  DOB: 10-20-2001    A large portion of this work will be completed and reviewed by the healthcare provider prior to the visit by using the data reviewed in the Clarity software.        Was the Subject???s data reviewed in the Clarity Software prior to Scheduled Visit Date?  [x]   Yes Date:23Oct2024  []   No     Was the Hypoglycemic Symptoms Log discussed with the subject? *Note that paper hypoglycemic symptoms log should be collected at in -person visits and data entered by site staff at that time. Please remind subject to bring the paper hypoglycemic symptoms log to the next in-person visit.      [x]   Yes   []   No    TITRATION PART 1: INSULIN DOSING AND MEALTIME REVIEW      Recording Event Data from Software into Source Documentation      Reminder: a MAIN MEAL is any meal that is >15g of carbohydrate, and NOT a ???snack??? meal or bolus associated with a snack or snack correction. In the table below, record the basal and bolus insulin doses, meal times, and carbohydrate values for the ???most complete??? 3 days leading into the visit.    Please remind the Subject not to change their carb to insulin ratios or basal insulin doses unless instructed to do so by the Investigator.     During the Visit, confirm with the Subject that the 3 days recorded are a complete record for each day (including insulin doses, times, and insulin types, as well as ???meals???/carb events--quantity of carbohydrates and time).   Note: Site staff to use best judgment as to whether data are complete.        Note: Refer to Clarity Guide for Information on Collecting Fasting and Post-Meal Glucose Values.  All Clarity-recorded glucose values used for titration should be screenshotted, initialed, and filed in the subject binder.      BASAL DOSING REVIEW AND TITRATION    Subject Basal Dose from Previous Week    This value was recommended by the PI in the previous visit.  Please check the Clarity Software to verify that basal dose is consistent and enter the Subject???s basal dose for last week below. Please note that the subject should not be changing their basal doses day-to-day, so the dose should be the same for all recorded days.    Subject Basal Dose from Previous Week (Week leading into visit)  28U        Record Basal Insulin Doses Leading into Visit in Table Below:   BASAL INSULIN DOSES (Week leading into visit)        Median Basal Dose for 3 days Leading Into Visit      Day 1  Day 2  Day 3      Date  23Oct2024     (dd/mmm/yyyy)  22Oct2024   (dd/mmm/yyyy)  21Oct2024   (dd/mmm/yyyy)  N/A    Basal Dose   Time of Day     2007  (24 hour-clock)  2105  (24 hour-clock)  2031  (24 hour-clock)  N/A     Value (Units)  28 28 28 28         Identify the Subject???s Median Fasting Blood Glucose Leading into Titration Visit:    The Median Fasting Blood Glucose Value is the middle number in a set of three values.?To find these values, review the Subject???s daily glucose plots in provided software and find the lowest fasting (pre-breakfast) glucose value for the three days within the week leading up to the visit/call day. Please refer to the software training documentation provided for further detail on where exactly to find these values.    Fasting glucose is defined as the lowest CGM value observed between 2:00 AM and start of the breakfast meal or first meal, with at least 8 hours of fasting.  Enter the three values below and sort in order of lowest to highest to determine the Median Fasting Blood Glucose Value prior to making basal dosing recommendation changes.    Fasting Blood Glucose Value Date mg/dl Time of recorded FBG   1 23Oct2024 178 0741   2 22Oct2024 199 0246   3 21Oct2024 46 0401   Please identify the Median of these Values by ordering them from low to high and selecting the middle value (see example below):    Median Fasting Blood Glucose Value   178 mg/dl      Use the median Fasting Blood Glucose from above and the algorithm below to determine the subject???s new basal dose, with the goal of achieving a fasting blood glucose between 80mg /dL and 161 mg/dL.     Algorithm for adjusting basal insulin Degludec    3-Day median fasting    glucose level  Adjustment    to insulin    Degludec  Amount    <80 mg/dL or had an unexplained episode of Level 2 fasting or pre-meal hypoglycemia (i.e., <54 mg/dL) occurs   Reduce by  2 U    80-110 mg/dL  No Change      096-045 mg/dL  Increase by  2 U    409-811 mg/dL  Increase by  4 U    914-782 mg/dL  Increase by  6 U    >956 mg/dL  Increase by  8 U      Degludec Protocol Dosing Recommendation (mark one)   (Please review dosing algorithm table above)    Decreased by 2 units []      No Change               []    Increased by 2 units []    Increased by 4 units []    Increased by 6 units []    Increased by 8 units []     New Basal Dose for Subject:                                      see provider note U      If Investigator chose to deviate from protocol recommendation, please indicate why:       Subject had a documented Level 2 Hypoglycemia (<54 mg/dL) in the last 7 days    []      Subject frequency of Level 1 Hypoglycemia (70mg /dL) has increased in the last 7 days    []    Subject Unwilling             []     Investigator Discretion           []   Explain:       Record Bolus Insulin Doses and Mealtime Carbohydrate Values Leading into Visit in Table Below:     Day 1  Day 2  Day 3  Did Subject Correctly Calculate and Follow Carb to Insulin Dosing Guidance? (comment for each meal)     Date  22Oct2024  (dd/mmm/yyyy)  21Oct2024  (dd/mmm/yyyy)  19Oct2024 (dd/mmm/yyyy)  N/A    Breakfast Bolus Dose   (Carb to Insulin Ratio from previous week)   _________  Time of Day of Breakfast Bolus 0735  (24 hour-clock)  N/A no bolus (was low)  (24 hour-clock)  1016  (24 hour-clock)     see provider note    Bolus Value (Units)  8 0 10     Meal Time  0735  (24 hour-clock)  0747  (24 hour-clock)  1016  (24 hour-clock)      Meal Carb Value (g) 54 60 81     Current Carb:Insulin Ratio 6.75 0 8.1    Lunch Bolus Dose    (Carb to Insulin Ratio from previous week)   _________  Time of Day of Lunch Bolus N/A no lunch  (24 hour-clock)  1057  (24 hour-clock)  N/A no lunch  (24 hour-clock)    see provider note     Bolus Value (Units)  0 15 0     Meal Time  N/A  (24 hour-clock)  1056  (24 hour-clock)  N/A  (24 hour-clock)      Meal Carb Value (g) 0 81 0      Current Carb:Insulin Ratio  0  5.4  0    Dinner Bolus Dose    (Carb to Insulin Ratio from previous week)   _________  Time of Day of Dinner Bolus 1830  (24 hour-clock)  1835   (24 hour-clock)  1828  (24 hour-clock)    see provider note     Bolus Value (Units)  16 16 10      Meal Time  1829  (24 hour-clock)  1834  (24 hour-clock)  1910  (24 hour-clock)      Meal Carb Value (g) 80 71 50     Current Carb: Insulin Ratio   5  4.4  5       Identify the Subject???s 2-Hour Postprandial Glucose Values Leading into Titration Visit:      Note: To find the Median 2-hour Postprandial  Glucose (PPG) for a Meal, you must find the glucose value that occurs 2 hours after the mealtime stamp entered by the subject.        CARB:INSULIN RATIO BOLUS CALCULATIONS            Day 1  Day 2  Day 3    Date (most complete previous 3 days)  22Oct2024 (dd/mmm/yyyy)  21Oct2024  (dd/mmm/yyyy)  19Oct2024 (dd/mmm/yyyy)    Breakfast 2-Hour Postprandial Glucose    Glucose Value   78 266 (no bolus!) 116    PPG TIme  0936  (24 hour-clock)  0946   (24 hour-clock)  1216  (24 hour-clock)    Lunch 2-Hour Postprandial Glucose       Glucose Value   N/A no lunch 122 N/A no lunch    PPG Time     N/A   (24 hour-clock)  1256   (24 hour-clock)  N/A  (24 hour-clock)    Dinner 2-Hour Postprandial Glucose       Glucose Value  161 181 206    PPG Time  2046  (24 hour-clock)  2036  (24 hour-clock)  2111   (24 hour-clock)           Subject CARB:INSULIN RATIOS used from previous week:      Breakfast Lunch Dinner   Median 2-hour PPG (previous week) 134 134 201   Median 2-hour PPG from week leading into Visit 116 122(only lunch) 181   Carb:Insulin Ratios used from Previous Visit 8 6 5    New Carb:Insulin Ratios   see provider note   see provider note   see provider note           If Investigator chose to deviate from protocol recommendation, please indicate why:    Subject had a documented Level 2 Hypoglycemia (<54 mg/dL) in the last 7 days          []    Subject frequency of Level 1 Hypoglycemia (70mg /dL) has increased in the last 7 days        []    Subject Unwilling                     []    Investigator Discretion           []  Explain:      BOLUS INSULIN ADJUSTMENT PROTOCOL:    GOAL IS 2-HOUR POST-PRANDIAL BLOOD SUGAR < 140mg /dL. Titration detail is provided below:      The following Table will be used to determine any needed change in Carb to Insulin ratio.   In the table, the value of the ratio currently in use is given as ???Xcurrent???, and the adjusted value (determined based on 2-   hour postprandial glucose values, assessed by inspection of CGM profiles) is given as ???Xnew???.       NOTE: the decision to change the Carb to Insulin ratio should be based on three or more days??? worth of data for each meal in question, using the MEDIAN 2-hour post prandial glucose (not the MEAN).      Bolus Insulin Dosing Algorithm:   Algorithm for Adjusting Bolus Insulin     2-hour Postprandial Glucose (MEDIAN over 3 days)  1 Unit Insulin per   X gram Carbs    <100 mg/dL  Xnew = 1.6 of Xcurrent    100-140 mg/dL  Xnew = Xcurrent    161-096 mg/dL  Xnew = 0.8 of Xcurrent    160-200 mg/dL  Xnew = 0.7 of Xcurrent    >200 mg/dL Xnew = 0.5 of Xcurrent

## 2023-08-26 NOTE — Unmapped (Signed)
[facility] Appointment Note  Study:  OPTI-2  Description: A Phase 2b Randomized, Double-blind Trial Comparing HDVInsulin Lispro Versus Insulin Lispro Alone in Adults with Type 1 Diabetes Receiving Insulin Degludec   Protocol:  DP 11-2021-01   Site Number: 05  [employer] IRB#: [id number]  PI: Noelle Penner, MD, PhD  Visit: Dose Optimization Visits 3  Source Version:1      Patient: Sandra Underwood  Date: 23Oct2024  DOB: 2001/01/01    Checklist:    X AE Review   X Concomitant Medications Review   N/A Vital Signs for in-person visits (V4,V8)   N/A Pregnancy Test  for in-person visits (V4,V8)   N/A Supply dispensing as needed (V4,V8)   X Hypoglycemic Symptoms Log Review (see provider note)   X CGM Data Sufficiency Review (see provider note)   X Insulin Dose and Meal Timing Review (see provider note)   X Clarity Reviews (fasting glucose, 2-hour post prandial blood glucose, meal dose, compliance) (see provider note)   X Insulin updates (see provider note)       Adverse Events  Has the subject experienced any Adverse Events since signing the Informed Consent?   []   Yes (If 'Yes', please complete an Adverse Events Form)  [x]   No    Has the subject taken any new medications or changed any medication usage since the last visit? No.(If ???Yes???, please complete a Concomitant Medications Form)   Medication Name  (Generic / Trade Name) Dose and Frequency Start Date  (dd/mmm/yyyy) Stop Date  (dd/mmm/yyyy) Class of Medication  Primary Indication             Hypoglycemic Symptoms Log     *Note that paper hypoglycemic symptoms log should be collected at in -person visits and data entered by site staff at that time. Please remind subject to bring the paper hypoglycemic symptoms log to the next in-person visit.      Was the Hypoglycemic Symptoms Log reviewed?   No. Reason: not in person.   Were any Hypoglycemic Symptoms experienced?   Yes. Reason: incorrect carb /insulin ratios.     CGM DATA SUFFICIENCY REVIEW    The ???Days with CGM Data??? value is located in the Clarity Software on the Subject???s Data page, on the right side of the screen. Please enter the % value noted for ???Days with CGM Data??? for the Date Range selected. Review the Clarity Software for date ranges with a value of >= 80%.       Days with CGM Data  100 %    Reminder:  There are TWO PARTS to these visits where insulin doses are titrated:    Titration Part 1 is recording data out of Clarity, reviewing it, and following both basal and bolus algorithms for titration.  This should be completed prior to the titration discussion with the subject.        Titration Part 2 is the visit with the patient, during which the titration recommendations are reviewed and implemented.        Note: These titration visits will only be completed during the Dose Optimization Period of the Study (not during the Maintenance Period, unless necessary and requested or indicated by Investigator).        Throughout the entirety of the study, including during run-in, Subjects should be recording the following Event information in real time into the G7 Phone App:   All Insulin doses (basal and bolus)   All MAIN Meal times, including carbohydrate  value of meal. This is typically Breakfast, Lunch, and Dinner, all meals >15g of carbohydrate but should include at least 2 Main Meals per day   Additionally, all ???feeling low??? events must be recorded in the Dexcom G7 App and recorded in the hypoglycemic symptoms log.    Please note that subject payment will be tied to completeness of data, and please also note that if a Subject tries to back-enter/back-fill information at a later date, the date/time stamps will not be correct in the system (they are not editable), and the data will not be usable.      Please remind the Subject not to change their carb to insulin ratios or basal insulin doses unless instructed to do so by the Investigator.       Martin Majestic, CMA

## 2023-08-27 NOTE — Unmapped (Signed)
[facility] Appointment Note  Study:  OPTI-2  Description: A Phase 2b Randomized, Double-blind Trial Comparing HDVInsulin Lispro Versus Insulin Lispro Alone in Adults with Type 1 Diabetes Receiving Insulin Degludec   Protocol:  DP 11-2021-01   Site Number: 05  [place] IRB#: [id number]  PI: Noelle Penner, MD, PhD  Visit: 3  Source Version:1      Patient: Sandra Underwood  Date: 24Oct2024  DOB: 12-08-2000    A large portion of this work will be completed and reviewed by the healthcare provider prior to the visit by using the data reviewed in the Clarity software.        Was the Subject???s data reviewed in the Clarity Software prior to Scheduled Visit Date?  [x]   Yes Date:23Oct2024 by [facility]  []   No     Was the Hypoglycemic Symptoms Log discussed with the subject? *Note that paper hypoglycemic symptoms log should be collected at in -person visits and data entered by site staff at that time. Please remind subject to bring the paper hypoglycemic symptoms log to the next in-person visit.      [x]   Yes   []   No    TITRATION PART 1: INSULIN DOSING AND MEALTIME REVIEW      Recording Event Data from Software into Source Documentation      Reminder: a MAIN MEAL is any meal that is >15g of carbohydrate, and NOT a ???snack??? meal or bolus associated with a snack or snack correction. In the table below, record the basal and bolus insulin doses, meal times, and carbohydrate values for the ???most complete??? 3 days leading into the visit.    Please remind the Subject not to change their carb to insulin ratios or basal insulin doses unless instructed to do so by the Investigator.     During the Visit, confirm with the Subject that the 3 days recorded are a complete record for each day (including insulin doses, times, and insulin types, as well as ???meals???/carb events--quantity of carbohydrates and time).   Note: Site staff to use best judgment as to whether data are complete.        Note: Refer to Clarity Guide for Information on Collecting Fasting and Post-Meal Glucose Values.  All Clarity-recorded glucose values used for titration should be screenshotted, initialed, and filed in the subject binder.      BASAL DOSING REVIEW AND TITRATION    Subject Basal Dose from Previous Week    This value was recommended by the PI in the previous visit.  Please check the Clarity Software to verify that basal dose is consistent and enter the Subject???s basal dose for last week below. Please note that the subject should not be changing their basal doses day-to-day, so the dose should be the same for all recorded days.    Subject Basal Dose from Previous Week (Week leading into visit)  28U        Record Basal Insulin Doses Leading into Visit in Table Below:   BASAL INSULIN DOSES (Week leading into visit)        Median Basal Dose for 3 days Leading Into Visit      Day 1  Day 2  Day 3      Date  23Oct2024     (dd/mmm/yyyy)  22Oct2024   (dd/mmm/yyyy)  21Oct2024   (dd/mmm/yyyy)  N/A    Basal Dose   Time of Day     2007  (24 hour-clock)  2105  (  24 hour-clock)  2031  (24 hour-clock)  N/A     Value (Units)  28 28 28 28         Identify the Subject???s Median Fasting Blood Glucose Leading into Titration Visit:    The Median Fasting Blood Glucose Value is the middle number in a set of three values.?To find these values, review the Subject???s daily glucose plots in provided software and find the lowest fasting (pre-breakfast) glucose value for the three days within the week leading up to the visit/call day. Please refer to the software training documentation provided for further detail on where exactly to find these values.    Fasting glucose is defined as the lowest CGM value observed between 2:00 AM and start of the breakfast meal or first meal, with at least 8 hours of fasting.  Enter the three values below and sort in order of lowest to highest to determine the Median Fasting Blood Glucose Value prior to making basal dosing recommendation changes.    Fasting Blood Glucose Value Date mg/dl Time of recorded FBG   1 23Oct2024 178 0741   2 22Oct2024 199 0246   3 21Oct2024 46 0401   Please identify the Median of these Values by ordering them from low to high and selecting the middle value (see example below):    Median Fasting Blood Glucose Value   178 mg/dl      Use the median Fasting Blood Glucose from above and the algorithm below to determine the subject???s new basal dose, with the goal of achieving a fasting blood glucose between 80mg /dL and 045 mg/dL.     Algorithm for adjusting basal insulin Degludec    3-Day median fasting    glucose level  Adjustment    to insulin    Degludec  Amount    <80 mg/dL or had an unexplained episode of Level 2 fasting or pre-meal hypoglycemia (i.e., <54 mg/dL) occurs   Reduce by  2 U    80-110 mg/dL  No Change      409-811 mg/dL  Increase by  2 U    914-782 mg/dL  Increase by  4 U    956-213 mg/dL  Increase by  6 U    >086 mg/dL  Increase by  8 U      Degludec Protocol Dosing Recommendation (mark one)   (Please review dosing algorithm table above)    Decreased by 2 units []      No Change               []    Increased by 2 units []    Increased by 4 units [x]    Increased by 6 units []    Increased by 8 units []     New Basal Dose for Subject:                                      32 U      If Investigator chose to deviate from protocol recommendation, please indicate why:       Subject had a documented Level 2 Hypoglycemia (<54 mg/dL) in the last 7 days    []      Subject frequency of Level 1 Hypoglycemia (70mg /dL) has increased in the last 7 days    []    Subject Unwilling             []     Investigator Discretion           []   Explain:     Though there is on episode of severe hypoglycemia, this is in the setting of hyperglycemia. We will go back to 32 units of Tresiba and see if this helps per algorithm.      Record Bolus Insulin Doses and Mealtime Carbohydrate Values Leading into Visit in Table Below:     Day 1  Day 2  Day 3  Did Subject Correctly Calculate and Follow Carb to Insulin Dosing Guidance? (comment for each meal)     Date  22Oct2024  (dd/mmm/yyyy)  21Oct2024  (dd/mmm/yyyy)  19Oct2024   (dd/mmm/yyyy)  N/A    Breakfast Bolus Dose   (Carb to Insulin Ratio from previous week)   _________  Time of Day of Breakfast Bolus 0735  (24 hour-clock)  N/A no bolus (was low)  (24 hour-clock)  1016  (24 hour-clock)   Slightly higher than recommended.    Bolus Value (Units)  8 0 10     Meal Time  0735  (24 hour-clock)  0747  (24 hour-clock)  1016  (24 hour-clock)      Meal Carb Value (g) 54 60 81     Current Carb:Insulin Ratio 6.75 0 8.1    Lunch Bolus Dose    (Carb to Insulin Ratio from previous week)   _________  Time of Day of Lunch Bolus N/A no lunch  (24 hour-clock)  1057  (24 hour-clock)  N/A no lunch  (24 hour-clock)  Close to target dose.     Bolus Value (Units)  0 15 0     Meal Time  N/A  (24 hour-clock)  1056  (24 hour-clock)  N/A  (24 hour-clock)      Meal Carb Value (g) 0 81 0      Current Carb:Insulin Ratio  0  5.4  0    Dinner Bolus Dose    (Carb to Insulin Ratio from previous week)   _________  Time of Day of Dinner Bolus 1830  (24 hour-clock)  1835   (24 hour-clock)  1828  (24 hour-clock)  Close to target dose.     Bolus Value (Units)  16 16 10      Meal Time  1829  (24 hour-clock)  1834  (24 hour-clock)  1910  (24 hour-clock)      Meal Carb Value (g) 80 71 50     Current Carb: Insulin Ratio   5  4.4  5       Identify the Subject???s 2-Hour Postprandial Glucose Values Leading into Titration Visit:      Note: To find the Median 2-hour Postprandial  Glucose (PPG) for a Meal, you must find the glucose value that occurs 2 hours after the mealtime stamp entered by the subject.        CARB:INSULIN RATIO BOLUS CALCULATIONS            Day 1  Day 2  Day 3    Date (most complete previous 3 days)  22Oct2024 (dd/mmm/yyyy)  21Oct2024  (dd/mmm/yyyy)  19Oct2024 (dd/mmm/yyyy)    Breakfast 2-Hour Postprandial Glucose    Glucose Value   78 266 (no bolus!) 116    PPG TIme  0936  (24 hour-clock)  0946   (24 hour-clock)  1216  (24 hour-clock)    Lunch 2-Hour Postprandial Glucose       Glucose Value   N/A no lunch 122 N/A no lunch    PPG Time     N/A   (24 hour-clock)  1256   (  24 hour-clock)  N/A  (24 hour-clock)    Dinner 2-Hour Postprandial Glucose       Glucose Value  161 181 200    PPG Time     2046  (24 hour-clock)  2036  (24 hour-clock)  2111   (24 hour-clock)           Subject CARB:INSULIN RATIOS used from previous week:      Breakfast Lunch Dinner   Median 2-hour PPG (previous week) 134 134 201   Median 2-hour PPG from week leading into Visit 116 122(only lunch) 181   Reported 8 6 5    Calculated carb ratios  6.75-8 5.4 4.4-5   Carb:Insulin Ratios used from Previous Visit 8 6 5    New Carb:Insulin Ratios 8 6 4            If Investigator chose to deviate from protocol recommendation, please indicate why:    Subject had a documented Level 2 Hypoglycemia (<54 mg/dL) in the last 7 days          []    Subject frequency of Level 1 Hypoglycemia (70mg /dL) has increased in the last 7 days        []    Subject Unwilling                     []    Investigator Discretion           [x]  Explain:       Given some hypoglycemia and concern for safety with increase of Tresiba, we will increaser by 20% rather than per the algorithm. She will let me know if she is having hypoglycemia.    BOLUS INSULIN ADJUSTMENT PROTOCOL:    GOAL IS 2-HOUR POST-PRANDIAL BLOOD SUGAR < 140mg /dL. Titration detail is provided below:      The following Table will be used to determine any needed change in Carb to Insulin ratio.   In the table, the value of the ratio currently in use is given as ???Xcurrent???, and the adjusted value (determined based on 2-   hour postprandial glucose values, assessed by inspection of CGM profiles) is given as ???Xnew???.       NOTE: the decision to change the Carb to Insulin ratio should be based on three or more days??? worth of data for each meal in question, using the MEDIAN 2-hour post prandial glucose (not the MEAN).      Bolus Insulin Dosing Algorithm:   Algorithm for Adjusting Bolus Insulin     2-hour Postprandial Glucose (MEDIAN over 3 days)  1 Unit Insulin per   X gram Carbs    <100 mg/dL  Xnew = 1.6 of Xcurrent    100-140 mg/dL  Xnew = Xcurrent    027-253 mg/dL  Xnew = 0.8 of Xcurrent    160-200 mg/dL  Xnew = 0.7 of Xcurrent    >200 mg/dL  Xnew = 0.5 of Xcurrent         I have reviewed the information collected in the source data sheet as it applies to the Subject???s dosing titrations. The titrations have been reviewed with the subject, and I recommend the above dosing titration to be made.        This progress note may be timestamped after permission was given to the coordinator to proceed. Eligibility was determined by me, the investigator, prior to moving forward.

## 2023-08-28 NOTE — Unmapped (Unsigned)
[facility] Appointment Note  Study:  OPTI-2  Description: A Phase 2b Randomized, Double-blind Trial Comparing HDVInsulin Lispro Versus Insulin Lispro Alone in Adults with Type 1 Diabetes Receiving Insulin Degludec   Protocol:  DP 11-2021-01   Site Number: 05  [place] IRB#: [id number]  PI: Noelle Penner, MD, PhD  Visit: Dose Optimization Visits 4  Source Version:1      Patient: Sandra Underwood  Date: 28Oct2024  DOB: [birth year]    Checklist:    X AE Review   X Concomitant Medications Review   X Vital Signs for in-person visits (V4,V8)   X Pregnancy Test  for in-person visits (V4,V8)   X Supply dispensing as needed (V4,V8)   X Hypoglycemic Symptoms Log Review (see provider note)   X CGM Data Sufficiency Review (see provider note)   X Insulin Dose and Meal Timing Review (see provider note)   X Clarity Reviews (fasting glucose, 2-hour post prandial blood glucose, meal dose, compliance) (see provider note)   X Insulin updates (see provider note)       Adverse Events  Has the subject experienced any Adverse Events since signing the Informed Consent?   []   Yes (If 'Yes', please complete an Adverse Events Form)  [x]   No    Has the subject taken any new medications or changed any medication usage since the last visit? No.(If ???Yes???, please complete a Concomitant Medications Form)   Medication Name  (Generic / Trade Name) Dose and Frequency Start Date  (dd/mmm/yyyy) Stop Date  (dd/mmm/yyyy) Class of Medication  Primary Indication             Hypoglycemic Symptoms Log     *Note that paper hypoglycemic symptoms log should be collected at in -person visits and data entered by site staff at that time. Please remind subject to bring the paper hypoglycemic symptoms log to the next in-person visit.      Was the Hypoglycemic Symptoms Log reviewed?   No. Reason: did not bring.   Were any Hypoglycemic Symptoms experienced?   Yes. Reason: adjustment of insulin w/ meals.     CGM DATA SUFFICIENCY REVIEW    The ???Days with CGM Data??? value is located in the Clarity Software on the Subject???s Data page, on the right side of the screen. Please enter the % value noted for ???Days with CGM Data??? for the Date Range selected. Review the Clarity Software for date ranges with a value of >= 80%.       Days with CGM Data  93 %    Reminder:  There are TWO PARTS to these visits where insulin doses are titrated:    Titration Part 1 is recording data out of Clarity, reviewing it, and following both basal and bolus algorithms for titration.  This should be completed prior to the titration discussion with the subject.        Titration Part 2 is the visit with the patient, during which the titration recommendations are reviewed and implemented.        Note: These titration visits will only be completed during the Dose Optimization Period of the Study (not during the Maintenance Period, unless necessary and requested or indicated by Investigator).        Throughout the entirety of the study, including during run-in, Subjects should be recording the following Event information in real time into the G7 Phone App:   All Insulin doses (basal and bolus)   All MAIN Meal times, including  carbohydrate value of meal. This is typically Breakfast, Lunch, and Dinner, all meals >15g of carbohydrate but should include at least 2 Main Meals per day   Additionally, all ???feeling low??? events must be recorded in the Dexcom G7 App and recorded in the hypoglycemic symptoms log.    Please note that subject payment will be tied to completeness of data, and please also note that if a Subject tries to back-enter/back-fill information at a later date, the date/time stamps will not be correct in the system (they are not editable), and the data will not be usable.      Please remind the Subject not to change their carb to insulin ratios or basal insulin doses unless instructed to do so by the Investigator.   Vital signs (V4,V8)  Subject must rest 5 minutes before reading BP. Time: 1126  BP: 104/75  Pulse(beats/min): 99  Oral temperature: 36.5C  Respiratory rate(breaths/min): 18  Weight 81.9    Labs(V4,V8)    If WOCBP, was the urine pregnancy test performed? Yes Time: 1210  What was the result? negative    Insulin Dispensing - Visit    Please ensure to capture dispensing information in the Drug Accountability Log.          Was Basal Insulin dispensed?   Yes.   Was Bolus Insulin dispensed?   Yes.   Was HDV-Lispro / sWFI-Lispro prepared as per drug preparation instructions? (0.98mL HDV or sWFI into 10mL vial of Lispro)         Yes.       ID/Quantity  Dispensing Date Return Date ML   [id number] 16Sep2024  N/A N/A   [id number] Sandra Underwood 16Sep2024 03Oct2024  4.4      ID Dispensing Date Return Date ML   [id number] 03Oct2024 N/A  N/A   [id number] 03Oct2024  did not bring today, advised not to use and bring at next appt     [id number] 03Oct2024  28Oct2024  1   [id number] 28Oct2024     [id number]  28Oct2024           Device and Supply Dispensing Details(V4,V8)     Number of Dexcom G7 sensors dispensed to subject (as needed): 4     Sensor Serial Number(s):  [id number]     Number of insulin syringes dispensed to subject:  150   Number of pen needles dispensed to subject:  33   Number of BGM test strips dispensed to subject:  0   Hypoglycemic Logs 1     Other    Scheduling  Dose Optimization Visit Week 5 (+/-4 days)Phone: 05Nov2024  Dose Optimization Visit Week 6 (+/-4 days)Phone: 14Nov2024  Dose Optimization Visit Week 7 (+/-4 days)Phone: 21Nov2024  Dose Optimization Visit Week 8 (+/-4 days)In-Person: 02Dec2024 @1100       Martin Majestic, CMA Period/Follow up Week 27(+/-4 days)Phone:  End of treatment In-person:     Martin Majestic, CMA

## 2023-08-28 NOTE — Unmapped (Unsigned)
duplicate Fasting and Post-Meal Glucose Values.  All Clarity-recorded glucose values used for titration should be screenshotted, initialed, and filed in the subject binder.      BASAL DOSING REVIEW AND TITRATION    Subject Basal Dose from Previous Week    This value was recommended by the PI in the previous visit.  Please check the Clarity Software to verify that basal dose is consistent and enter the Subject???s basal dose for last week below. Please note that the subject should not be changing their basal doses day-to-day, so the dose should be the same for all recorded days.    Subject Basal Dose from Previous Week (Week leading into visit)  32U        Record Basal Insulin Doses Leading into Visit in Table Below:   BASAL INSULIN DOSES (Week leading into visit)        Median Basal Dose for 3 days Leading Into Visit      Day 1  Day 2  Day 3      Date  27Oct2024     (dd/mmm/yyyy)  26Oct2024   (dd/mmm/yyyy)  25Oct2024   (dd/mmm/yyyy)  N/A    Basal Dose   Time of Day     2022  (24 hour-clock)  2110  (24 hour-clock)  2030(pt reported)  (24 hour-clock)  N/A     Value (Units)  32 32 32(pt reported) 32        Identify the Subject???s Median Fasting Blood Glucose Leading into Titration Visit:    The Median Fasting Blood Glucose Value is the middle number in a set of three values.?To find these values, review the Subject???s daily glucose plots in provided software and find the lowest fasting (pre-breakfast) glucose value for the three days within the week leading up to the visit/call day. Please refer to the software training documentation provided for further detail on where exactly to find these values.    Fasting glucose is defined as the lowest CGM value observed between 2:00 AM and start of the breakfast meal or first meal, with at least 8 hours of fasting.  Enter the three values below and sort in order of lowest to highest to determine the Median Fasting Blood Glucose Value prior to making basal dosing recommendation changes. Fasting Blood Glucose Value Date mg/dl Time of recorded FBG   1 27Oct2024 92 1029   2 26Oct2024 150 0754   3 25Oct2024 67 0731   Please identify the Median of these Values by ordering them from low to high and selecting the middle value (see example below):    Median Fasting Blood Glucose Value   92 mg/dl      Use the median Fasting Blood Glucose from above and the algorithm below to determine the subject???s new basal dose, with the goal of achieving a fasting blood glucose between 80mg /dL and 161 mg/dL.     Algorithm for adjusting basal insulin Degludec    3-Day median fasting    glucose level  Adjustment    to insulin    Degludec  Amount    <80 mg/dL or had an unexplained episode of Level 2 fasting or pre-meal hypoglycemia (i.e., <54 mg/dL) occurs   Reduce by  2 U    80-110 mg/dL  No Change      096-045 mg/dL  Increase by  2 U    409-811 mg/dL  Increase by  4 U    914-782 mg/dL  Increase by  6 U    >  240 mg/dL  Increase by  8 U      Degludec Protocol Dosing Recommendation (mark one)   (Please review dosing algorithm table above)    Decreased by 2 units []      No Change               []    Increased by 2 units []    Increased by 4 units []    Increased by 6 units []    Increased by 8 units []     New Basal Dose for Subject:                                      *** U      If Investigator chose to deviate from protocol recommendation, please indicate why:       Subject had a documented Level 2 Hypoglycemia (<54 mg/dL) in the last 7 days    []      Subject frequency of Level 1 Hypoglycemia (70mg /dL) has increased in the last 7 days    []    Subject Unwilling             []     Investigator Discretion           [] Explain: ***      Record Bolus Insulin Doses and Mealtime Carbohydrate Values Leading into Visit in Table Below:     Day 1  Day 2  Day 3  Did Subject Correctly Calculate and Follow Carb to Insulin Dosing Guidance? (comment for each meal)     Date  27Oct2024  (dd/mmm/yyyy)  25Oct2024 (no meals recorded on 26)  (dd/mmm/yyyy)  24Oct2024   (dd/mmm/yyyy)  N/A    Breakfast Bolus Dose   (Carb to Insulin Ratio from previous week)   _________  Time of Day of Breakfast Bolus 1024  (24 hour-clock)  N/A no breakfast  (24 hour-clock)  0703  (24 hour-clock)   ***    Bolus Value (Units)  14 0 17     Meal Time  1023  (24 hour-clock)  N/A no breakfast  (24 hour-clock)  0703  (24 hour-clock)      Meal Carb Value (g) 110 0 135     Current Carb:Insulin Ratio 7.9 0 7.9    Lunch Bolus Dose    (Carb to Insulin Ratio from previous week)   _________  Time of Day of Lunch Bolus N/A no lunch bolus (low)  (24 hour-clock)  N/A no lunch  (24 hour-clock)  N/A no lunch  (24 hour-clock)  ***     Bolus Value (Units)  0 0 0     Meal Time  1254  (24 hour-clock)  N/A no lunch  (24 hour-clock)  N/A no lunch  (24 hour-clock)      Meal Carb Value (g) 35 0 0      Current Carb:Insulin Ratio  0  0  0    Dinner Bolus Dose    (Carb to Insulin Ratio from previous week)   _________  Time of Day of Dinner Bolus 1829  (24 hour-clock)  1757   (24 hour-clock)  1918  (24 hour-clock)  ***     Bolus Value (Units)  8 18 12      Meal Time  1830(pt reported)  (24 hour-clock)  1757  (24 hour-clock)  1917  (24 hour-clock)      Meal Carb Value (g) 40(pt reported) 70 48  Current Carb: Insulin Ratio   5  3.9  4       Identify the Subject???s 2-Hour Postprandial Glucose Values Leading into Titration Visit:      Note: To find the Median 2-hour Postprandial  Glucose (PPG) for a Meal, you must find the glucose value that occurs 2 hours after the mealtime stamp entered by the subject.        CARB:INSULIN RATIO BOLUS CALCULATIONS            Day 1  Day 2  Day 3    Date (most complete previous 3 days)  27Oct2024 (dd/mmm/yyyy)  25Oct2024  (dd/mmm/yyyy)  24Oct2024 (dd/mmm/yyyy)    Breakfast 2-Hour Postprandial Glucose    Glucose Value   58 N/A 74    PPG TIme  1224  (24 hour-clock)  N/A   (24 hour-clock)  0901  (24 hour-clock)    Lunch 2-Hour Postprandial Glucose       Glucose Value 152 (did not bolus-treated low) N/A N/A no lunch    PPG Time     1454   (24 hour-clock)  N/A   (24 hour-clock)  N/A  (24 hour-clock)    Dinner 2-Hour Postprandial Glucose       Glucose Value  218 97 228    PPG Time     2029  (24 hour-clock)  1956  (24 hour-clock)  2111   (24 hour-clock)           Subject CARB:INSULIN RATIOS used from previous week:      Breakfast Lunch Dinner   Median 2-hour PPG (previous week) 116 122 181   Median 2-hour PPG from week leading into Visit 66 152 218   Carb:Insulin Ratios used from Previous Visit 8 6 4    New Carb:Insulin Ratios *** *** ***      If Investigator chose to deviate from protocol recommendation, please indicate why:    Subject had a documented Level 2 Hypoglycemia (<54 mg/dL) in the last 7 days          []    Subject frequency of Level 1 Hypoglycemia (70mg /dL) has increased in the last 7 days        []    Subject Unwilling                     []    Investigator Discretion           []  Explain: ***     BOLUS INSULIN ADJUSTMENT PROTOCOL:    GOAL IS 2-HOUR POST-PRANDIAL BLOOD SUGAR < 140mg /dL. Titration detail is provided below:      The following Table will be used to determine any needed change in Carb to Insulin ratio.   In the table, the value of the ratio currently in use is given as ???Xcurrent???, and the adjusted value (determined based on 2-   hour postprandial glucose values, assessed by inspection of CGM profiles) is given as ???Xnew???.       NOTE: the decision to change the Carb to Insulin ratio should be based on three or more days??? worth of data for each meal in question, using the MEDIAN 2-hour post prandial glucose (not the MEAN).      Bolus Insulin Dosing Algorithm:   Algorithm for Adjusting Bolus Insulin     2-hour Postprandial Glucose (MEDIAN over 3 days)  1 Unit Insulin per   X gram Carbs    <100 mg/dL  Xnew = 1.6 of Xcurrent    100-140 mg/dL  Xnew = Xcurrent    140-160 mg/dL  Xnew = 0.8 of Xcurrent    160-200 mg/dL  Xnew = 0.7 of Xcurrent    >200 mg/dL  Xnew = 0.5 of Xcurrent           I have reviewed the information collected in the source data sheet as it applies to the Subject???s dosing titrations. The titrations have been reviewed with the subject, and I recommend the above dosing titration to be made.         ***    This progress note may be timestamped after permission was given to the coordinator to proceed. Eligibility was determined by me, the investigator, prior to moving forward.

## 2023-08-31 ENCOUNTER — Institutional Professional Consult (permissible substitution): Admit: 2023-08-31 | Discharge: 2023-09-01

## 2023-08-31 DIAGNOSIS — Z006 Encounter for examination for normal comparison and control in clinical research program: Principal | ICD-10-CM

## 2023-08-31 MED ORDER — STUDY DP 01-2023-01 INSULIN DEGLUDEC 100 UNITS/ML INJECTION PEN
PEN_INJECTOR | 0 refills | 0 days | Status: CP
Start: 2023-08-31 — End: ?

## 2023-08-31 MED ORDER — STUDY DP 01-2023-01 HDV-LISPRO U-100 / PLACEBO INJECTION SOLUTION
0 refills | 0 days | Status: CP
Start: 2023-08-31 — End: ?

## 2023-08-31 NOTE — Unmapped (Unsigned)
[facility] Appointment Note  Study:  OPTI-2  Description: A Phase 2b Randomized, Double-blind Trial Comparing HDVInsulin Lispro Versus Insulin Lispro Alone in Adults with Type 1 Diabetes Receiving Insulin Degludec   Protocol:  DP 11-2021-01   Site Number: 05  [place] IRB#: [id number]  PI: Noelle Penner, MD, PhD  Visit: 4  Source Version:1      Patient: Sandra Underwood  Date: 28Oct2024  DOB: 01-20-2001    A large portion of this work will be completed and reviewed by the healthcare provider prior to the visit by using the data reviewed in the Clarity software.        Was the Subject???s data reviewed in the Clarity Software prior to Scheduled Visit Date?  []   Yes Date:  [x]   No     Was the Hypoglycemic Symptoms Log discussed with the subject? *Note that paper hypoglycemic symptoms log should be collected at in -person visits and data entered by site staff at that time. Please remind subject to bring the paper hypoglycemic symptoms log to the next in-person visit.      [x]   Yes   []   No    TITRATION PART 1: INSULIN DOSING AND MEALTIME REVIEW      Recording Event Data from Software into Source Documentation      Reminder: a MAIN MEAL is any meal that is >15g of carbohydrate, and NOT a ???snack??? meal or bolus associated with a snack or snack correction. In the table below, record the basal and bolus insulin doses, meal times, and carbohydrate values for the ???most complete??? 3 days leading into the visit.    Please remind the Subject not to change their carb to insulin ratios or basal insulin doses unless instructed to do so by the Investigator.     During the Visit, confirm with the Subject that the 3 days recorded are a complete record for each day (including insulin doses, times, and insulin types, as well as ???meals???/carb events--quantity of carbohydrates and time).   Note: Site staff to use best judgment as to whether data are complete.        Note: Refer to Clarity Guide for Information on Collecting Fasting and Post-Meal Glucose Values.  All Clarity-recorded glucose values used for titration should be screenshotted, initialed, and filed in the subject binder.      BASAL DOSING REVIEW AND TITRATION    Subject Basal Dose from Previous Week    This value was recommended by the PI in the previous visit.  Please check the Clarity Software to verify that basal dose is consistent and enter the Subject???s basal dose for last week below. Please note that the subject should not be changing their basal doses day-to-day, so the dose should be the same for all recorded days.    Subject Basal Dose from Previous Week (Week leading into visit)  32U        Record Basal Insulin Doses Leading into Visit in Table Below:   BASAL INSULIN DOSES (Week leading into visit)        Median Basal Dose for 3 days Leading Into Visit      Day 1  Day 2  Day 3      Date  27Oct2024     (dd/mmm/yyyy)  26Oct2024   (dd/mmm/yyyy)  25Oct2024   (dd/mmm/yyyy)  N/A    Basal Dose   Time of Day     2022  (24 hour-clock)  2110  (24 hour-clock)  2030(pt reported)  (24 hour-clock)  N/A     Value (Units)  32 32 32(pt reported) 32        Identify the Subject???s Median Fasting Blood Glucose Leading into Titration Visit:    The Median Fasting Blood Glucose Value is the middle number in a set of three values.?To find these values, review the Subject???s daily glucose plots in provided software and find the lowest fasting (pre-breakfast) glucose value for the three days within the week leading up to the visit/call day. Please refer to the software training documentation provided for further detail on where exactly to find these values.    Fasting glucose is defined as the lowest CGM value observed between 2:00 AM and start of the breakfast meal or first meal, with at least 8 hours of fasting.  Enter the three values below and sort in order of lowest to highest to determine the Median Fasting Blood Glucose Value prior to making basal dosing recommendation changes. Fasting Blood Glucose Value Date mg/dl Time of recorded FBG   1 27Oct2024 92 1029   2 26Oct2024 150 0751   3 25Oct2024 67 0731   Please identify the Median of these Values by ordering them from low to high and selecting the middle value (see example below):    Median Fasting Blood Glucose Value   92 mg/dl      Use the median Fasting Blood Glucose from above and the algorithm below to determine the subject???s new basal dose, with the goal of achieving a fasting blood glucose between 80mg /dL and 161 mg/dL.     Algorithm for adjusting basal insulin Degludec    3-Day median fasting    glucose level  Adjustment    to insulin    Degludec  Amount    <80 mg/dL or had an unexplained episode of Level 2 fasting or pre-meal hypoglycemia (i.e., <54 mg/dL) occurs   Reduce by  2 U    80-110 mg/dL  No Change      096-045 mg/dL  Increase by  2 U    409-811 mg/dL  Increase by  4 U    914-782 mg/dL  Increase by  6 U    >956 mg/dL  Increase by  8 U      Degludec Protocol Dosing Recommendation (mark one)   (Please review dosing algorithm table above)    Decreased by 2 units []      No Change               [x]    Increased by 2 units []    Increased by 4 units []    Increased by 6 units []    Increased by 8 units []     New Basal Dose for Subject:                                      32 U      If Investigator chose to deviate from protocol recommendation, please indicate why:       Subject had a documented Level 2 Hypoglycemia (<54 mg/dL) in the last 7 days    []      Subject frequency of Level 1 Hypoglycemia (70mg /dL) has increased in the last 7 days    []    Subject Unwilling             []     Investigator Discretion           []   Explain:       Record Bolus Insulin Doses and Mealtime Carbohydrate Values Leading into Visit in Table Below:     Day 1  Day 2  Day 3  Did Subject Correctly Calculate and Follow Carb to Insulin Dosing Guidance? (comment for each meal)     Date  27Oct2024  (dd/mmm/yyyy)  25Oct2024 (no meals recorded on 26)  (dd/mmm/yyyy)  24Oct2024   (dd/mmm/yyyy)  N/A    Breakfast Bolus Dose   (Carb to Insulin Ratio from previous week)   _________  Time of Day of Breakfast Bolus 1024  (24 hour-clock)  N/A no breakfast  (24 hour-clock)  0703  (24 hour-clock)   ***    Bolus Value (Units)  14 0 17     Meal Time  1023  (24 hour-clock)  N/A no breakfast  (24 hour-clock)  0703  (24 hour-clock)      Meal Carb Value (g) 110 0 135     Current Carb:Insulin Ratio 7.9 0 7.9    Lunch Bolus Dose    (Carb to Insulin Ratio from previous week)   _________  Time of Day of Lunch Bolus N/A no lunch bolus (low)  (24 hour-clock)  N/A no lunch  (24 hour-clock)  N/A no lunch  (24 hour-clock)  ***     Bolus Value (Units)  0 0 0     Meal Time  1254  (24 hour-clock)  N/A no lunch  (24 hour-clock)  N/A no lunch  (24 hour-clock)      Meal Carb Value (g) 35 0 0      Current Carb:Insulin Ratio  0  0  0    Dinner Bolus Dose    (Carb to Insulin Ratio from previous week)   _________  Time of Day of Dinner Bolus 1829  (24 hour-clock)  1757   (24 hour-clock)  1918  (24 hour-clock)  ***     Bolus Value (Units)  8 18 12      Meal Time  1830(pt reported)  (24 hour-clock)  1757  (24 hour-clock)  1917  (24 hour-clock)      Meal Carb Value (g) 40(pt reported) 70 48     Current Carb: Insulin Ratio   5  3.9  4       Identify the Subject???s 2-Hour Postprandial Glucose Values Leading into Titration Visit:      Note: To find the Median 2-hour Postprandial  Glucose (PPG) for a Meal, you must find the glucose value that occurs 2 hours after the mealtime stamp entered by the subject.        CARB:INSULIN RATIO BOLUS CALCULATIONS            Day 1  Day 2  Day 3    Date (most complete previous 3 days)  27Oct2024 (dd/mmm/yyyy)  25Oct2024  (dd/mmm/yyyy)  24Oct2024 (dd/mmm/yyyy)    Breakfast 2-Hour Postprandial Glucose    Glucose Value   58 N/A 74    PPG TIme  1224  (24 hour-clock)  N/A   (24 hour-clock)  0901  (24 hour-clock)    Lunch 2-Hour Postprandial Glucose       Glucose Value 152 (did not bolus-treated low) N/A N/A no lunch    PPG Time     1454   (24 hour-clock)  N/A   (24 hour-clock)  N/A  (24 hour-clock)    Dinner 2-Hour Postprandial Glucose       Glucose Value  218 97 228  PPG Time     2029  (24 hour-clock)  1956  (24 hour-clock)  2111   (24 hour-clock)           Subject CARB:INSULIN RATIOS used from previous week:      Breakfast Lunch Dinner   Median 2-hour PPG (previous week) 116 122 181   Median 2-hour PPG from week leading into Visit 66 152 218   Carb:Insulin Ratios used from Previous Visit 8 6 4    New Carb:Insulin Ratios 8 6 4       If Investigator chose to deviate from protocol recommendation, please indicate why:    Subject had a documented Level 2 Hypoglycemia (<54 mg/dL) in the last 7 days          []    Subject frequency of Level 1 Hypoglycemia (70mg /dL) has increased in the last 7 days        []    Subject Unwilling                     []    Investigator Discretion           [x]  Explain: encouraged her to more diligently count her carbohydrates and more accurately cover her mealtime excursions; an increase per protocol puts her at risk for hypoglycemia    BOLUS INSULIN ADJUSTMENT PROTOCOL:    GOAL IS 2-HOUR POST-PRANDIAL BLOOD SUGAR < 140mg /dL. Titration detail is provided below:      The following Table will be used to determine any needed change in Carb to Insulin ratio.   In the table, the value of the ratio currently in use is given as ???Xcurrent???, and the adjusted value (determined based on 2-   hour postprandial glucose values, assessed by inspection of CGM profiles) is given as ???Xnew???.       NOTE: the decision to change the Carb to Insulin ratio should be based on three or more days??? worth of data for each meal in question, using the MEDIAN 2-hour post prandial glucose (not the MEAN).      Bolus Insulin Dosing Algorithm:   Algorithm for Adjusting Bolus Insulin     2-hour Postprandial Glucose (MEDIAN over 3 days)  1 Unit Insulin per   X gram Carbs    <100 mg/dL  Xnew = 1.6 of Xcurrent    100-140 mg/dL  Xnew = Xcurrent    161-096 mg/dL  Xnew = 0.8 of Xcurrent    160-200 mg/dL  Xnew = 0.7 of Xcurrent    >200 mg/dL  Xnew = 0.5 of Xcurrent           I have reviewed the information collected in the source data sheet as it applies to the Subject???s dosing titrations. The titrations have been reviewed with the subject, and I recommend the above dosing titration to be made.         LAY    This progress note may be timestamped after permission was given to the coordinator to proceed. Eligibility was determined by me, the investigator, prior to moving forward.

## 2023-09-07 NOTE — Unmapped (Addendum)
[facility] Appointment Note  Study:  OPTI-2  Description: A Phase 2b Randomized, Double-blind Trial Comparing HDVInsulin Lispro Versus Insulin Lispro Alone in Adults with Type 1 Diabetes Receiving Insulin Degludec   Protocol:  DP 11-2021-01   Site Number: 05  [facility] IRB#: [id number]  PI: Noelle Penner, MD, PhD  Visit: 5  Source Version:1      Patient: Sandra Underwood  Date: 05Nov2024  DOB: 12-25-2000    A large portion of this work will be completed and reviewed by the healthcare provider prior to the visit by using the data reviewed in the Clarity software.        Was the Subject???s data reviewed in the Clarity Software prior to Scheduled Visit Date?  [x]   Yes Date:04Nov2024  []   No     Was the Hypoglycemic Symptoms Log discussed with the subject? *Note that paper hypoglycemic symptoms log should be collected at in -person visits and data entered by site staff at that time. Please remind subject to bring the paper hypoglycemic symptoms log to the next in-person visit.      []   Yes   []   No    TITRATION PART 1: INSULIN DOSING AND MEALTIME REVIEW      Recording Event Data from Software into Source Documentation      Reminder: a MAIN MEAL is any meal that is >15g of carbohydrate, and NOT a ???snack??? meal or bolus associated with a snack or snack correction. In the table below, record the basal and bolus insulin doses, meal times, and carbohydrate values for the ???most complete??? 3 days leading into the visit.    Please remind the Subject not to change their carb to insulin ratios or basal insulin doses unless instructed to do so by the Investigator.     During the Visit, confirm with the Subject that the 3 days recorded are a complete record for each day (including insulin doses, times, and insulin types, as well as ???meals???/carb events--quantity of carbohydrates and time).   Note: Site staff to use best judgment as to whether data are complete.        Note: Refer to Clarity Guide for Information on Collecting Fasting and Post-Meal Glucose Values.  All Clarity-recorded glucose values used for titration should be screenshotted, initialed, and filed in the subject binder.      BASAL DOSING REVIEW AND TITRATION    Subject Basal Dose from Previous Week    This value was recommended by the PI in the previous visit.  Please check the Clarity Software to verify that basal dose is consistent and enter the Subject???s basal dose for last week below. Please note that the subject should not be changing their basal doses day-to-day, so the dose should be the same for all recorded days.    Subject Basal Dose from Previous Week (Week leading into visit)  32U        Record Basal Insulin Doses Leading into Visit in Table Below:   BASAL INSULIN DOSES (Week leading into visit)        Median Basal Dose for 3 days Leading Into Visit      Day 1  Day 2  Day 3      Date  04Nov2024     (dd/mmm/yyyy)  03Nov2024   (dd/mmm/yyyy)  01Nov2024 (none documented for 02Nov)  (dd/mmm/yyyy)  N/A    Basal Dose   Time of Day     2045  (24 hour-clock)  2140  (  24 hour-clock)  2151  (24 hour-clock)  N/A     Value (Units)  32 32 32 32        Identify the Subject???s Median Fasting Blood Glucose Leading into Titration Visit:    The Median Fasting Blood Glucose Value is the middle number in a set of three values.?To find these values, review the Subject???s daily glucose plots in provided software and find the lowest fasting (pre-breakfast) glucose value for the three days within the week leading up to the visit/call day. Please refer to the software training documentation provided for further detail on where exactly to find these values.    Fasting glucose is defined as the lowest CGM value observed between 2:00 AM and start of the breakfast meal or first meal, with at least 8 hours of fasting.  Enter the three values below and sort in order of lowest to highest to determine the Median Fasting Blood Glucose Value prior to making basal dosing recommendation changes.    Fasting Blood Glucose Value Date mg/dl Time of recorded FBG   1 04Nov2024 47 0704   2 03Nov2024 208 0205   3 02Nov2024 57 0604   Please identify the Median of these Values by ordering them from low to high and selecting the middle value (see example below):    Median Fasting Blood Glucose Value   57 mg/dl      Use the median Fasting Blood Glucose from above and the algorithm below to determine the subject???s new basal dose, with the goal of achieving a fasting blood glucose between 80mg /dL and 811 mg/dL.     Algorithm for adjusting basal insulin Degludec    3-Day median fasting    glucose level  Adjustment    to insulin    Degludec  Amount    <80 mg/dL or had an unexplained episode of Level 2 fasting or pre-meal hypoglycemia (i.e., <54 mg/dL) occurs   Reduce by  2 U    80-110 mg/dL  No Change      914-782 mg/dL  Increase by  2 U    956-213 mg/dL  Increase by  4 U    086-578 mg/dL  Increase by  6 U    >469 mg/dL  Increase by  8 U      Degludec Protocol Dosing Recommendation (mark one)   (Please review dosing algorithm table above)    Decreased by 2 units []      No Change               []    Increased by 2 units []    Increased by 4 units []    Increased by 6 units []    Increased by 8 units []     New Basal Dose for Subject:                                      see provider note U      If Investigator chose to deviate from protocol recommendation, please indicate why:       Subject had a documented Level 2 Hypoglycemia (<54 mg/dL) in the last 7 days    []      Subject frequency of Level 1 Hypoglycemia (70mg /dL) has increased in the last 7 days    []    Subject Unwilling             []     Investigator Discretion           []   Explain:       Record Bolus Insulin Doses and Mealtime Carbohydrate Values Leading into Visit in Table Below:     Day 1  Day 2  Day 3  Did Subject Correctly Calculate and Follow Carb to Insulin Dosing Guidance? (comment for each meal)     Date  04Nov2024  (dd/mmm/yyyy) 03Nov2024  (dd/mmm/yyyy)  02Nov2024   (dd/mmm/yyyy)  N/A    Breakfast Bolus Dose   (Carb to Insulin Ratio from previous week)   _________  Time of Day of Breakfast Bolus N/A no breakfast  (24 hour-clock)  0722  (24 hour-clock)  N/A no bolus(low)  (24 hour-clock)   see provider note    Bolus Value (Units)  0 10 0     Meal Time  N/A  (24 hour-clock)  0722  (24 hour-clock)  0849  (24 hour-clock)      Meal Carb Value (g) 0 80 48     Current Carb:Insulin Ratio 0 8 0    Lunch Bolus Dose    (Carb to Insulin Ratio from previous week)   _________  Time of Day of Lunch Bolus 1110  (24 hour-clock)  1322  (24 hour-clock)  N/A no lunch  (24 hour-clock)  see provider note     Bolus Value (Units)  17 20 0     Meal Time  1110  (24 hour-clock)  1322  (24 hour-clock)  N/A no lunch  (24 hour-clock)      Meal Carb Value (g) 85 100 0      Current Carb:Insulin Ratio  5  5  0    Dinner Bolus Dose    (Carb to Insulin Ratio from previous week)   _________  Time of Day of Dinner Bolus 1816  (24 hour-clock)  1815   (24 hour-clock)  1704  (24 hour-clock)  see provider note     Bolus Value (Units)  19 18 15      Meal Time  1816  (24 hour-clock)  1816  (24 hour-clock)  1705  (24 hour-clock)      Meal Carb Value (g) 74 73 65     Current Carb: Insulin Ratio   3.9  4.1  4.3       Identify the Subject???s 2-Hour Postprandial Glucose Values Leading into Titration Visit:      Note: To find the Median 2-hour Postprandial  Glucose (PPG) for a Meal, you must find the glucose value that occurs 2 hours after the mealtime stamp entered by the subject.        CARB:INSULIN RATIO BOLUS CALCULATIONS            Day 1  Day 2  Day 3    Date (most complete previous 3 days)  04Nov2024 (dd/mmm/yyyy)  03Nov2024  (dd/mmm/yyyy)  02Nov2024 (dd/mmm/yyyy)    Breakfast 2-Hour Postprandial Glucose    Glucose Value   N/A no breakfast 286 208    PPG TIme  N/A  (24 hour-clock)  0920   (24 hour-clock)  1049  (24 hour-clock)    Lunch 2-Hour Postprandial Glucose       Glucose Value 245 86 N/A no lunch    PPG Time     1309   (24 hour-clock)  1524   (24 hour-clock)  N/A  (24 hour-clock)    Dinner 2-Hour Postprandial Glucose       Glucose Value  86 65 190    PPG Time     2014  (24 hour-clock)  2014  (  24 hour-clock)  1909   (24 hour-clock)           Subject CARB:INSULIN RATIOS used from previous week:      Breakfast Lunch Dinner   Median 2-hour PPG (previous week) 66 152 218   Median 2-hour PPG from week leading into Visit 247 151 86   Carb:Insulin Ratios used from Previous Visit 8 6 4    New Carb:Insulin Ratios see provider note see provider note see provider note         If Investigator chose to deviate from protocol recommendation, please indicate why:    Subject had a documented Level 2 Hypoglycemia (<54 mg/dL) in the last 7 days          []    Subject frequency of Level 1 Hypoglycemia (70mg /dL) has increased in the last 7 days        []    Subject Unwilling                     []    Investigator Discretion           []  Explain:      BOLUS INSULIN ADJUSTMENT PROTOCOL:    GOAL IS 2-HOUR POST-PRANDIAL BLOOD SUGAR < 140mg /dL. Titration detail is provided below:      The following Table will be used to determine any needed change in Carb to Insulin ratio.   In the table, the value of the ratio currently in use is given as ???Xcurrent???, and the adjusted value (determined based on 2-   hour postprandial glucose values, assessed by inspection of CGM profiles) is given as ???Xnew???.       NOTE: the decision to change the Carb to Insulin ratio should be based on three or more days??? worth of data for each meal in question, using the MEDIAN 2-hour post prandial glucose (not the MEAN).      Bolus Insulin Dosing Algorithm:   Algorithm for Adjusting Bolus Insulin     2-hour Postprandial Glucose (MEDIAN over 3 days)  1 Unit Insulin per   X gram Carbs    <100 mg/dL  Xnew = 1.6 of Xcurrent    100-140 mg/dL  Xnew = Xcurrent    161-096 mg/dL  Xnew = 0.8 of Xcurrent    160-200 mg/dL  Xnew = 0.7 of Xcurrent    >200 mg/dL Xnew = 0.5 of Xcurrent

## 2023-09-07 NOTE — Unmapped (Signed)
[facility] Appointment Note  Study:  OPTI-2  Description: A Phase 2b Randomized, Double-blind Trial Comparing HDVInsulin Lispro Versus Insulin Lispro Alone in Adults with Type 1 Diabetes Receiving Insulin Degludec   Protocol:  DP 11-2021-01   Site Number: 05  [facility] IRB#: [id number]  PI: Noelle Penner, MD, PhD  Visit: Dose Optimization Visits 5  Source Version:1      Patient: Sandra Underwood  Date: 06Nov2024  DOB: April 03, 2001    Checklist:    X AE Review   X Concomitant Medications Review   N/A Vital Signs for in-person visits (V4,V8)   N/A Pregnancy Test  for in-person visits (V4,V8)   N/A Supply dispensing as needed (V4,V8)   X Hypoglycemic Symptoms Log Review (see provider note)   X CGM Data Sufficiency Review (see provider note)   X Insulin Dose and Meal Timing Review (see provider note)   X Clarity Reviews (fasting glucose, 2-hour post prandial blood glucose, meal dose, compliance) (see provider note)   X Insulin updates (see provider note)       Adverse Events  Has the subject experienced any Adverse Events since signing the Informed Consent?   []   Yes (If 'Yes', please complete an Adverse Events Form)  [x]   No    Has the subject taken any new medications or changed any medication usage since the last visit? No.(If ???Yes???, please complete a Concomitant Medications Form)   Medication Name  (Generic / Trade Name) Dose and Frequency Start Date  (dd/mmm/yyyy) Stop Date  (dd/mmm/yyyy) Class of Medication  Primary Indication             Hypoglycemic Symptoms Log     *Note that paper hypoglycemic symptoms log should be collected at in -person visits and data entered by site staff at that time. Please remind subject to bring the paper hypoglycemic symptoms log to the next in-person visit.      Was the Hypoglycemic Symptoms Log reviewed?   No. Reason: not in person.   Were any Hypoglycemic Symptoms experienced?   Yes. Reason: see provider note.     CGM DATA SUFFICIENCY REVIEW    The ???Days with CGM Data??? value is located in the Clarity Software on the Subject???s Data page, on the right side of the screen. Please enter the % value noted for ???Days with CGM Data??? for the Date Range selected. Review the Clarity Software for date ranges with a value of >= 80%.       Days with CGM Data  93 %    Reminder:  There are TWO PARTS to these visits where insulin doses are titrated:    Titration Part 1 is recording data out of Clarity, reviewing it, and following both basal and bolus algorithms for titration.  This should be completed prior to the titration discussion with the subject.        Titration Part 2 is the visit with the patient, during which the titration recommendations are reviewed and implemented.        Note: These titration visits will only be completed during the Dose Optimization Period of the Study (not during the Maintenance Period, unless necessary and requested or indicated by Investigator).        Throughout the entirety of the study, including during run-in, Subjects should be recording the following Event information in real time into the G7 Phone App:   All Insulin doses (basal and bolus)   All MAIN Meal times, including carbohydrate value  of meal. This is typically Breakfast, Lunch, and Dinner, all meals >15g of carbohydrate but should include at least 2 Main Meals per day   Additionally, all ???feeling low??? events must be recorded in the Dexcom G7 App and recorded in the hypoglycemic symptoms log.    Please note that subject payment will be tied to completeness of data, and please also note that if a Subject tries to back-enter/back-fill information at a later date, the date/time stamps will not be correct in the system (they are not editable), and the data will not be usable.      Please remind the Subject not to change their carb to insulin ratios or basal insulin doses unless instructed to do so by the Investigator.     Martin Majestic, CMA

## 2023-09-08 NOTE — Unmapped (Signed)
[facility] Appointment Note  Study:  OPTI-2  Description: A Phase 2b Randomized, Double-blind Trial Comparing HDVInsulin Lispro Versus Insulin Lispro Alone in Adults with Type 1 Diabetes Receiving Insulin Degludec   Protocol:  DP 11-2021-01   Site Number: 05  [place] IRB#: [id number]  PI: Noelle Penner, MD, PhD  Visit: 5  Source Version:1        Patient: Sandra Underwood  Date: 05Nov2024  DOB: 2001/09/27     A large portion of this work will be completed and reviewed by the healthcare provider prior to the visit by using the data reviewed in the Clarity software.        Was the Subject???s data reviewed in the Clarity Software prior to Scheduled Visit Date?  [x]   Yes Date:04Nov2024  []   No      Was the Hypoglycemic Symptoms Log discussed with the subject? *Note that paper hypoglycemic symptoms log should be collected at in -person visits and data entered by site staff at that time. Please remind subject to bring the paper hypoglycemic symptoms log to the next in-person visit.      []   Yes   [x]   No     TITRATION PART 1: INSULIN DOSING AND MEALTIME REVIEW      Recording Event Data from Software into Source Documentation      Reminder: a MAIN MEAL is any meal that is >15g of carbohydrate, and NOT a ???snack??? meal or bolus associated with a snack or snack correction. In the table below, record the basal and bolus insulin doses, meal times, and carbohydrate values for the ???most complete??? 3 days leading into the visit.    Please remind the Subject not to change their carb to insulin ratios or basal insulin doses unless instructed to do so by the Investigator.     During the Visit, confirm with the Subject that the 3 days recorded are a complete record for each day (including insulin doses, times, and insulin types, as well as ???meals???/carb events--quantity of carbohydrates and time).   Note: Site staff to use best judgment as to whether data are complete.        Note: Refer to Clarity Guide for Information on Collecting Fasting and Post-Meal Glucose Values.  All Clarity-recorded glucose values used for titration should be screenshotted, initialed, and filed in the subject binder.          BASAL DOSING REVIEW AND TITRATION    Subject Basal Dose from Previous Week    This value was recommended by the PI in the previous visit.  Please check the Clarity Software to verify that basal dose is consistent and enter the Subject???s basal dose for last week below. Please note that the subject should not be changing their basal doses day-to-day, so the dose should be the same for all recorded days.    Subject Basal Dose from Previous Week (Week leading into visit)  32U          Record Basal Insulin Doses Leading into Visit in Table Below:           BASAL INSULIN DOSES (Week leading into visit)        Median Basal Dose for 3 days Leading Into Visit      Day 1  Day 2  Day 3      Date  04Nov2024     (dd/mmm/yyyy)  03Nov2024   (dd/mmm/yyyy)  01Nov2024 (none documented for 02Nov)  (dd/mmm/yyyy)  N/A  Basal Dose   Time of Day     2045  (24 hour-clock)  2140  (24 hour-clock)  2151  (24 hour-clock)  N/A     Value (Units)  32 32 32 32             Identify the Subject???s Median Fasting Blood Glucose Leading into Titration Visit:    The Median Fasting Blood Glucose Value is the middle number in a set of three values.?To find these values, review the Subject???s daily glucose plots in provided software and find the lowest fasting (pre-breakfast) glucose value for the three days within the week leading up to the visit/call day. Please refer to the software training documentation provided for further detail on where exactly to find these values.    Fasting glucose is defined as the lowest CGM value observed between 2:00 AM and start of the breakfast meal or first meal, with at least 8 hours of fasting.  Enter the three values below and sort in order of lowest to highest to determine the Median Fasting Blood Glucose Value prior to making basal dosing recommendation changes.    Fasting Blood Glucose Value Date mg/dl Time of recorded FBG   1 04Nov2024 47 0704   2 03Nov2024 208 0205   3 02Nov2024 57 0604   Please identify the Median of these Values by ordering them from low to high and selecting the middle value (see example below):    Median Fasting Blood Glucose Value   57 mg/dl       Use the median Fasting Blood Glucose from above and the algorithm below to determine the subject???s new basal dose, with the goal of achieving a fasting blood glucose between 80mg /dL and 981 mg/dL.           Algorithm for adjusting basal insulin Degludec    3-Day median fasting    glucose level  Adjustment    to insulin    Degludec  Amount    <80 mg/dL or had an unexplained episode of Level 2 fasting or pre-meal hypoglycemia (i.e., <54 mg/dL) occurs   Reduce by  2 U    80-110 mg/dL  No Change      191-478 mg/dL  Increase by  2 U    295-621 mg/dL  Increase by  4 U    308-657 mg/dL  Increase by  6 U    >846 mg/dL  Increase by  8 U       Degludec Protocol Dosing Recommendation (mark one)   (Please review dosing algorithm table above)    Decreased by 2 units []      No Change               []    Increased by 2 units []    Increased by 4 units []    Increased by 6 units []    Increased by 8 units []     New Basal Dose for Subject:                                      32 U       If Investigator chose to deviate from protocol recommendation, please indicate why:       Subject had a documented Level 2 Hypoglycemia (<54 mg/dL) in the last 7 days               [x]   Subject frequency of Level 1 Hypoglycemia (70mg /dL) has increased in the last 7 days    [x]    Subject Unwilling                                                                                                                                            []            Investigator Discretion                                                                                                                                    [] Explain: I also asked the participant to call, email or text me should she continue to have glucoses <54 or more than fleeting hypoglycemia <70 at night.       Record Bolus Insulin Doses and Mealtime Carbohydrate Values Leading into Visit in Table Below:             Day 1  Day 2  Day 3  Did Subject Correctly Calculate and Follow Carb to Insulin Dosing Guidance? (comment for each meal)     Date  04Nov2024  (dd/mmm/yyyy)  03Nov2024  (dd/mmm/yyyy)  02Nov2024   (dd/mmm/yyyy)  N/A    Breakfast Bolus Dose   (Carb to Insulin Ratio from previous week)   _________  Time of Day of Breakfast Bolus N/A no breakfast  (24 hour-clock)  0722  (24 hour-clock)  N/A no bolus(low)  (24 hour-clock)   no    Bolus Value (Units)  0 10 0     Meal Time  N/A  (24 hour-clock)  0722  (24 hour-clock)  0849  (24 hour-clock)      Meal Carb Value (g) 0 80 48      Current Carb:Insulin Ratio 0 8 0     Lunch Bolus Dose    (Carb to Insulin Ratio from previous week)   _________  Time of Day of Lunch Bolus 1110  (24 hour-clock)  1322  (24 hour-clock)  N/A no lunch  (24 hour-clock)  yes     Bolus Value (Units)  17 20 0     Meal Time  1110  (24 hour-clock)  1322  (24 hour-clock)  N/A no lunch  (24 hour-clock)      Meal Carb Value (g) 85 100 0  Current Carb:Insulin Ratio  5  5  0    Dinner Bolus Dose    (Carb to Insulin Ratio from previous week)   _________  Time of Day of Dinner Bolus 1816  (24 hour-clock)  1815   (24 hour-clock)  1704  (24 hour-clock)  yes     Bolus Value (Units)  19 18 15      Meal Time  1816  (24 hour-clock)  1816  (24 hour-clock)  1705  (24 hour-clock)      Meal Carb Value (g) 74 73 65     Current Carb: Insulin Ratio   3.9  4.1  4.3        Identify the Subject???s 2-Hour Postprandial Glucose Values Leading into Titration Visit:      Note: To find the Median 2-hour Postprandial  Glucose (PPG) for a Meal, you must find the glucose value that occurs 2 hours after the mealtime stamp entered by the subject.               CARB:INSULIN RATIO BOLUS CALCULATIONS            Day 1  Day 2  Day 3    Date (most complete previous 3 days)  04Nov2024 (dd/mmm/yyyy)  03Nov2024  (dd/mmm/yyyy)  02Nov2024 (dd/mmm/yyyy)    Breakfast 2-Hour Postprandial Glucose    Glucose Value   N/A no breakfast 286 208    PPG TIme  N/A  (24 hour-clock)  0920   (24 hour-clock)  1049  (24 hour-clock)    Lunch 2-Hour Postprandial Glucose       Glucose Value   245 86 N/A no lunch    PPG Time     1309   (24 hour-clock)  1524   (24 hour-clock)  N/A  (24 hour-clock)    Dinner 2-Hour Postprandial Glucose       Glucose Value  86 65 190    PPG Time     2014  (24 hour-clock)  2014  (24 hour-clock)  1909   (24 hour-clock)            Subject CARB:INSULIN RATIOS used from previous week:        Breakfast Lunch Dinner   Median 2-hour PPG (previous week) 66 152 218   Median 2-hour PPG from week leading into Visit 247 151 86   Carb:Insulin Ratios used from Previous Visit 8 6 4    New Carb:Insulin Ratios 5 5 5           If Investigator chose to deviate from protocol recommendation, please indicate why:    Subject had a documented Level 2 Hypoglycemia (<54 mg/dL) in the last 7 days                     [x]    Subject frequency of Level 1 Hypoglycemia (70mg /dL) has increased in the last 7 days        [x]    Subject Unwilling                                                                                                                                         []   Investigator Discretion                                                                                                                                    [x]  Explain: Generally followed. Instead of 4 at breakfast, picked 5 to allow all meals to be 5.       BOLUS INSULIN ADJUSTMENT PROTOCOL:    GOAL IS 2-HOUR POST-PRANDIAL BLOOD SUGAR < 140mg /dL. Titration detail is provided below:      The following Table will be used to determine any needed change in Carb to Insulin ratio.   In the table, the value of the ratio currently in use is given as ???Xcurrent???, and the adjusted value (determined based on 2-   hour postprandial glucose values, assessed by inspection of CGM profiles) is given as ???Xnew???.       NOTE: the decision to change the Carb to Insulin ratio should be based on three or more days??? worth of data for each meal in question, using the MEDIAN 2-hour post prandial glucose (not the MEAN).      Bolus Insulin Dosing Algorithm:       Algorithm for Adjusting Bolus Insulin     2-hour Postprandial Glucose (MEDIAN over 3 days)  1 Unit Insulin per   X gram Carbs    <100 mg/dL  Xnew = 1.6 of Xcurrent    100-140 mg/dL  Xnew = Xcurrent    831-517 mg/dL  Xnew = 0.8 of Xcurrent    160-200 mg/dL  Xnew = 0.7 of Xcurrent    >200 mg/dL  Xnew = 0.5 of Xcurrent             I have reviewed the information collected in the source data sheet as it applies to the Subject???s dosing titrations. The titrations have been reviewed with the subject, and I recommend the above dosing titration to be made.         Elmer Picker, MD, PhD     This progress note may be timestamped after permission was given to the coordinator to proceed. Eligibility was determined by me, the investigator, prior to moving forward.

## 2023-09-16 MED ORDER — LEVOTHYROXINE 25 MCG TABLET
ORAL_TABLET | Freq: Every day | ORAL | 3 refills | 90 days
Start: 2023-09-16 — End: 2024-09-15

## 2023-09-16 MED ORDER — DULOXETINE 60 MG CAPSULE,DELAYED RELEASE
ORAL_CAPSULE | Freq: Every day | ORAL | 1 refills | 90 days
Start: 2023-09-16 — End: 2024-09-15

## 2023-09-17 NOTE — Unmapped (Signed)
[facility] Appointment Note  Study:  OPTI-2  Description: A Phase 2b Randomized, Double-blind Trial Comparing HDVInsulin Lispro Versus Insulin Lispro Alone in Adults with Type 1 Diabetes Receiving Insulin Degludec   Protocol:  DP 11-2021-01   Site Number: 05  [place] IRB#: [id number]  PI: Noelle Penner, MD, PhD  Visit: Dose Optimization Visits 6  Source Version:1      Patient: Hester Silberman  Date: 14Nov2024  DOB: [birth year]    Checklist:    X AE Review   X Concomitant Medications Review   N/A Vital Signs for in-person visits (V4,V8)   N/A Pregnancy Test  for in-person visits (V4,V8)   N/A Supply dispensing as needed (V4,V8)   N/A Hypoglycemic Symptoms Log Review (see provider note)   N/A CGM Data Sufficiency Review (see provider note)   N/A Insulin Dose and Meal Timing Review (see provider note)   X Clarity Reviews (fasting glucose, 2-hour post prandial blood glucose, meal dose, compliance) (see provider note)   X Insulin updates (see provider note)       Adverse Events  Has the subject experienced any Adverse Events since signing the Informed Consent?   []   Yes (If 'Yes', please complete an Adverse Events Form)  [x]   No    Has the subject taken any new medications or changed any medication usage since the last visit? No.(If ???Yes???, please complete a Concomitant Medications Form)   Medication Name  (Generic / Trade Name) Dose and Frequency Start Date  (dd/mmm/yyyy) Stop Date  (dd/mmm/yyyy) Class of Medication  Primary Indication             Hypoglycemic Symptoms Log     *Note that paper hypoglycemic symptoms log should be collected at in -person visits and data entered by site staff at that time. Please remind subject to bring the paper hypoglycemic symptoms log to the next in-person visit.      Was the Hypoglycemic Symptoms Log reviewed?   No. Reason: not in person.         CGM DATA SUFFICIENCY REVIEW    The ???Days with CGM Data??? value is located in the Clarity Software on the Subject???s Data page, on the right side of the screen. Please enter the % value noted for ???Days with CGM Data??? for the Date Range selected. Review the Clarity Software for date ranges with a value of >= 80%.       Days with CGM Data  100 %    Reminder:  There are TWO PARTS to these visits where insulin doses are titrated:    Titration Part 1 is recording data out of Clarity, reviewing it, and following both basal and bolus algorithms for titration.  This should be completed prior to the titration discussion with the subject.        Titration Part 2 is the visit with the patient, during which the titration recommendations are reviewed and implemented.        Note: These titration visits will only be completed during the Dose Optimization Period of the Study (not during the Maintenance Period, unless necessary and requested or indicated by Investigator).        Throughout the entirety of the study, including during run-in, Subjects should be recording the following Event information in real time into the G7 Phone App:   All Insulin doses (basal and bolus)   All MAIN Meal times, including carbohydrate value of meal. This is typically Breakfast, Lunch, and Dinner, all  meals >15g of carbohydrate but should include at least 2 Main Meals per day   Additionally, all ???feeling low??? events must be recorded in the Dexcom G7 App and recorded in the hypoglycemic symptoms log.    Please note that subject payment will be tied to completeness of data, and please also note that if a Subject tries to back-enter/back-fill information at a later date, the date/time stamps will not be correct in the system (they are not editable), and the data will not be usable.      Please remind the Subject not to change their carb to insulin ratios or basal insulin doses unless instructed to do so by the Investigator.     Martin Majestic, CMA

## 2023-09-17 NOTE — Unmapped (Signed)
[facility] Appointment Note  Study:  OPTI-2  Description: A Phase 2b Randomized, Double-blind Trial Comparing HDVInsulin Lispro Versus Insulin Lispro Alone in Adults with Type 1 Diabetes Receiving Insulin Degludec   Protocol:  DP 11-2021-01   Site Number: 05   IRB#: [id number]  PI: Noelle Penner, MD, PhD  Visit: 6  Source Version:1      Patient: Sandra Underwood  Date: 14Nov2024  DOB: 2001-04-25    A large portion of this work will be completed and reviewed by the healthcare provider prior to the visit by using the data reviewed in the Clarity software.        Was the Subject???s data reviewed in the Clarity Software prior to Scheduled Visit Date?  []   Yes Date:  [x]   No     Was the Hypoglycemic Symptoms Log discussed with the subject? *Note that paper hypoglycemic symptoms log should be collected at in -person visits and data entered by site staff at that time. Please remind subject to bring the paper hypoglycemic symptoms log to the next in-person visit.      [x]   Yes   []   No    TITRATION PART 1: INSULIN DOSING AND MEALTIME REVIEW      Recording Event Data from Software into Source Documentation      Reminder: a MAIN MEAL is any meal that is >15g of carbohydrate, and NOT a ???snack??? meal or bolus associated with a snack or snack correction. In the table below, record the basal and bolus insulin doses, meal times, and carbohydrate values for the ???most complete??? 3 days leading into the visit.    Please remind the Subject not to change their carb to insulin ratios or basal insulin doses unless instructed to do so by the Investigator.     During the Visit, confirm with the Subject that the 3 days recorded are a complete record for each day (including insulin doses, times, and insulin types, as well as ???meals???/carb events--quantity of carbohydrates and time).   Note: Site staff to use best judgment as to whether data are complete.        Note: Refer to Clarity Guide for Information on Collecting Fasting and Post-Meal Glucose Values.  All Clarity-recorded glucose values used for titration should be screenshotted, initialed, and filed in the subject binder.      BASAL DOSING REVIEW AND TITRATION    Subject Basal Dose from Previous Week    This value was recommended by the PI in the previous visit.  Please check the Clarity Software to verify that basal dose is consistent and enter the Subject???s basal dose for last week below. Please note that the subject should not be changing their basal doses day-to-day, so the dose should be the same for all recorded days.    Subject Basal Dose from Previous Week (Week leading into visit)  32U   - Sandra Underwood notes that she was told to use 30U and that is what she has done, and I agree with this dose.     Record Basal Insulin Doses Leading into Visit in Table Below:   BASAL INSULIN DOSES (Week leading into visit)        Median Basal Dose for 3 days Leading Into Visit      Day 1  Day 2  Day 3      Date  13Nov2024     (dd/mmm/yyyy)  12Nov2024   (dd/mmm/yyyy)  11NOv2024   (dd/mmm/yyyy)  N/A  Basal Dose   Time of Day     2051  (24 hour-clock)  2031  (24 hour-clock)  2103  (24 hour-clock)  N/A     Value (Units)  30 30 30 30         Identify the Subject???s Median Fasting Blood Glucose Leading into Titration Visit:    The Median Fasting Blood Glucose Value is the middle number in a set of three values.?To find these values, review the Subject???s daily glucose plots in provided software and find the lowest fasting (pre-breakfast) glucose value for the three days within the week leading up to the visit/call day. Please refer to the software training documentation provided for further detail on where exactly to find these values.    Fasting glucose is defined as the lowest CGM value observed between 2:00 AM and start of the breakfast meal or first meal, with at least 8 hours of fasting.  Enter the three values below and sort in order of lowest to highest to determine the Median Fasting Blood Glucose Value prior to making basal dosing recommendation changes.    Fasting Blood Glucose Value Date mg/dl Time of recorded FBG   1 13Nov2024 226 0658   2 12Nov2024 117 0413   3 11Nov2024 53 0508   Please identify the Median of these Values by ordering them from low to high and selecting the middle value (see example below):    Median Fasting Blood Glucose Value   117 mg/dl      Use the median Fasting Blood Glucose from above and the algorithm below to determine the subject???s new basal dose, with the goal of achieving a fasting blood glucose between 80mg /dL and 865 mg/dL.     Algorithm for adjusting basal insulin Degludec    3-Day median fasting    glucose level  Adjustment    to insulin    Degludec  Amount    <80 mg/dL or had an unexplained episode of Level 2 fasting or pre-meal hypoglycemia (i.e., <54 mg/dL) occurs   Reduce by  2 U    80-110 mg/dL  No Change      784-696 mg/dL  Increase by  2 U    295-284 mg/dL  Increase by  4 U    132-440 mg/dL  Increase by  6 U    >102 mg/dL  Increase by  8 U      Degludec Protocol Dosing Recommendation (mark one)   (Please review dosing algorithm table above)    Decreased by 2 units []      No Change               [x]    Increased by 2 units []    Increased by 4 units []    Increased by 6 units []    Increased by 8 units []     New Basal Dose for Subject:                                      30 U        If Investigator chose to deviate from protocol recommendation, please indicate why:       Subject had a documented Level 2 Hypoglycemia (<54 mg/dL) in the last 7 days    [x]      Subject frequency of Level 1 Hypoglycemia (70mg /dL) has increased in the last 7 days    []    Subject Unwilling             []   Investigator Discretion           [] Explain:     Given overnight hypoglycemia 54mg /dL will leave Tresiba dose at 30U for the next week to prevent nocturnal hypoglycemia, especially since another day glycemia was quite tight with severe hypo with 32 units.      Record Bolus Insulin Doses and Mealtime Carbohydrate Values Leading into Visit in Table Below:     Day 1  Day 2  Day 3  Did Subject Correctly Calculate and Follow Carb to Insulin Dosing Guidance? (comment for each meal)     Date  13Nov2024  (dd/mmm/yyyy)  12Nov2024  (dd/mmm/yyyy)  11Nov2024   (dd/mmm/yyyy)  N/A    Breakfast Bolus Dose   (Carb to Insulin Ratio from previous week)   _________  Time of Day of Breakfast Bolus 0705  (24 hour-clock)  N/A no breakfast  (24 hour-clock)  0730  (24 hour-clock)   Yes    Bolus Value (Units)  20 0 16     Meal Time  0705  (24 hour-clock)  N/A no breakfast  (24 hour-clock)  0730  (24 hour-clock)      Meal Carb Value (g) 101 0 81     Current Carb:Insulin Ratio 5.05 0 5.06    Lunch Bolus Dose    (Carb to Insulin Ratio from previous week)   _________  Time of Day of Lunch Bolus 1208  (24 hour-clock)  1114  (24 hour-clock)  N/A no lunch  (24 hour-clock)  Yes    Bolus Value (Units)  15 20 0     Meal Time  1208  (24 hour-clock)  1114  (24 hour-clock)  N/A no lunch  (24 hour-clock)      Meal Carb Value (g) 73 100 0      Current Carb:Insulin Ratio  4.9  5  0    Dinner Bolus Dose    (Carb to Insulin Ratio from previous week)   _________  Time of Day of Dinner Bolus 1758  (24 hour-clock)  1900   (24 hour-clock)  N/A no dinner bolus  (24 hour-clock)  Yes     Bolus Value (Units)  15 22 0     Meal Time  1758  (24 hour-clock)  1900  (24 hour-clock)  1930  (24 hour-clock)      Meal Carb Value (g) 68 110 states she had chicken quesadilla     Current Carb: Insulin Ratio   4.5  5  0       Identify the Subject???s 2-Hour Postprandial Glucose Values Leading into Titration Visit:      Note: To find the Median 2-hour Postprandial  Glucose (PPG) for a Meal, you must find the glucose value that occurs 2 hours after the mealtime stamp entered by the subject.        CARB:INSULIN RATIO BOLUS CALCULATIONS            Day 1  Day 2  Day 3    Date (most complete previous 3 days)  13Nov2024 (dd/mmm/yyyy) 12Nov2024  (dd/mmm/yyyy)  11Nov2024 (dd/mmm/yyyy)    Breakfast 2-Hour Postprandial Glucose    Glucose Value   105 N/A no breakfast 163    PPG TIme  0903  (24 hour-clock)  N/A   (24 hour-clock)  0933  (24 hour-clock)    Lunch 2-Hour Postprandial Glucose       Glucose Value   116 119 N/A no lunch    PPG Time     1408   (  24 hour-clock)  1313   (24 hour-clock)  N/A  (24 hour-clock)    Dinner 2-Hour Postprandial Glucose       Glucose Value  191 83 268    PPG Time     1958  (24 hour-clock)  2058  (24 hour-clock)  2133   (24 hour-clock)           Subject CARB:INSULIN RATIOS used from previous week:      Breakfast Lunch Dinner   Median 2-hour PPG (previous week) 247 151 86   Median 2-hour PPG from week leading into Visit 134 118 191   Carb:Insulin Ratios used from Previous Visit 5 5 5    New Carb:Insulin Ratios 5 5 5            If Investigator chose to deviate from protocol recommendation, please indicate why:    Subject had a documented Level 2 Hypoglycemia (<54 mg/dL) in the last 7 days          []    Subject frequency of Level 1 Hypoglycemia (70mg /dL) has increased in the last 7 days        []    Subject Unwilling                     []    Investigator Discretion           [x]  Explain:    Evening dose may need to be increased, but giving hypoglycemia that resulted from dose when she was not high, favor not increasing the dose. Timing of insulin remains a major issue and discussed bolusing before she eats a meal.     BOLUS INSULIN ADJUSTMENT PROTOCOL:    GOAL IS 2-HOUR POST-PRANDIAL BLOOD SUGAR < 140mg /dL. Titration detail is provided below:      The following Table will be used to determine any needed change in Carb to Insulin ratio.   In the table, the value of the ratio currently in use is given as ???Xcurrent???, and the adjusted value (determined based on 2-   hour postprandial glucose values, assessed by inspection of CGM profiles) is given as ???Xnew???.       NOTE: the decision to change the Carb to Insulin ratio should be based on three or more days??? worth of data for each meal in question, using the MEDIAN 2-hour post prandial glucose (not the MEAN).      Bolus Insulin Dosing Algorithm:   Algorithm for Adjusting Bolus Insulin     2-hour Postprandial Glucose (MEDIAN over 3 days)  1 Unit Insulin per   X gram Carbs    <100 mg/dL  Xnew = 1.6 of Xcurrent    100-140 mg/dL  Xnew = Xcurrent    518-841 mg/dL  Xnew = 0.8 of Xcurrent    160-200 mg/dL  Xnew = 0.7 of Xcurrent    >200 mg/dL  Xnew = 0.5 of Xcurrent           I have reviewed the information collected in the source data sheet as it applies to the Subject???s dosing titrations. The titrations have been reviewed with the subject, and I recommend the above dosing titration to be made.        This progress note may be timestamped after permission was given to the coordinator to proceed. Eligibility was determined by me, the investigator, prior to moving forward.
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
## 2023-09-23 MED FILL — TACROLIMUS 1 MG CAPSULE, IMMEDIATE-RELEASE: ORAL | 30 days supply | Qty: 60 | Fill #5

## 2023-09-23 MED FILL — CETIRIZINE 10 MG TABLET: ORAL | 30 days supply | Qty: 120 | Fill #1

## 2023-09-24 NOTE — Unmapped (Addendum)
[facility] Appointment Note  Study:  OPTI-2  Description: A Phase 2b Randomized, Double-blind Trial Comparing HDVInsulin Lispro Versus Insulin Lispro Alone in Adults with Type 1 Diabetes Receiving Insulin Degludec   Protocol:  DP 11-2021-01   Site Number: 05  [facility] IRB#: [id number]  PI: Noelle Penner, MD, PhD  Visit: 7  Source Version:1      Patient: Sandra Underwood  Date: 21Nov2024  DOB: 2001/02/11    A large portion of this work will be completed and reviewed by the healthcare provider prior to the visit by using the data reviewed in the Clarity software.        Was the Subject???s data reviewed in the Clarity Software prior to Scheduled Visit Date?  []   Yes Date:  [x]   No     Was the Hypoglycemic Symptoms Log discussed with the subject? *Note that paper hypoglycemic symptoms log should be collected at in -person visits and data entered by site staff at that time. Please remind subject to bring the paper hypoglycemic symptoms log to the next in-person visit.      [x]   Yes   []   No    TITRATION PART 1: INSULIN DOSING AND MEALTIME REVIEW      Recording Event Data from Software into Source Documentation      Reminder: a MAIN MEAL is any meal that is >15g of carbohydrate, and NOT a ???snack??? meal or bolus associated with a snack or snack correction. In the table below, record the basal and bolus insulin doses, meal times, and carbohydrate values for the ???most complete??? 3 days leading into the visit.    Please remind the Subject not to change their carb to insulin ratios or basal insulin doses unless instructed to do so by the Investigator.     During the Visit, confirm with the Subject that the 3 days recorded are a complete record for each day (including insulin doses, times, and insulin types, as well as ???meals???/carb events--quantity of carbohydrates and time).   Note: Site staff to use best judgment as to whether data are complete.        Note: Refer to Clarity Guide for Information on Collecting Fasting and Post-Meal Glucose Values.  All Clarity-recorded glucose values used for titration should be screenshotted, initialed, and filed in the subject binder.      BASAL DOSING REVIEW AND TITRATION    Subject Basal Dose from Previous Week    This value was recommended by the PI in the previous visit.  Please check the Clarity Software to verify that basal dose is consistent and enter the Subject???s basal dose for last week below. Please note that the subject should not be changing their basal doses day-to-day, so the dose should be the same for all recorded days.    Subject Basal Dose from Previous Week (Week leading into visit)  32U        Record Basal Insulin Doses Leading into Visit in Table Below:   BASAL INSULIN DOSES (Week leading into visit)        Median Basal Dose for 3 days Leading Into Visit      Day 1  Day 2  Day 3      Date  20Nov2024     (dd/mmm/yyyy)  19Nov2024   (dd/mmm/yyyy)  18Nov2024   (dd/mmm/yyyy)  N/A    Basal Dose   Time of Day     2043  (24 hour-clock)  2102  (24 hour-clock)  2127  (24 hour-clock)  N/A     Value (Units)  30 30 30 30         Identify the Subject???s Median Fasting Blood Glucose Leading into Titration Visit:    The Median Fasting Blood Glucose Value is the middle number in a set of three values.?To find these values, review the Subject???s daily glucose plots in provided software and find the lowest fasting (pre-breakfast) glucose value for the three days within the week leading up to the visit/call day. Please refer to the software training documentation provided for further detail on where exactly to find these values.    Fasting glucose is defined as the lowest CGM value observed between 2:00 AM and start of the breakfast meal or first meal, with at least 8 hours of fasting.  Enter the three values below and sort in order of lowest to highest to determine the Median Fasting Blood Glucose Value prior to making basal dosing recommendation changes.    Fasting Blood Glucose Value Date mg/dl Time of recorded FBG   1 20Nov2024 184 0655   2 19Nov2024 152 0800   3 18Nov2024 139 0720   Please identify the Median of these Values by ordering them from low to high and selecting the middle value (see example below):    Median Fasting Blood Glucose Value   152 mg/dl      Use the median Fasting Blood Glucose from above and the algorithm below to determine the subject???s new basal dose, with the goal of achieving a fasting blood glucose between 80mg /dL and 161 mg/dL.     Algorithm for adjusting basal insulin Degludec    3-Day median fasting    glucose level  Adjustment    to insulin    Degludec  Amount    <80 mg/dL or had an unexplained episode of Level 2 fasting or pre-meal hypoglycemia (i.e., <54 mg/dL) occurs   Reduce by  2 U    80-110 mg/dL  No Change      096-045 mg/dL  Increase by  2 U    409-811 mg/dL  Increase by  4 U    914-782 mg/dL  Increase by  6 U    >956 mg/dL  Increase by  8 U      Degludec Protocol Dosing Recommendation (mark one)   (Please review dosing algorithm table above)    Decreased by 2 units []      No Change               []    Increased by 2 units []    Increased by 4 units []    Increased by 6 units []    Increased by 8 units []     New Basal Dose for Subject:                                      see provider note U      If Investigator chose to deviate from protocol recommendation, please indicate why:       Subject had a documented Level 2 Hypoglycemia (<54 mg/dL) in the last 7 days    []      Subject frequency of Level 1 Hypoglycemia (70mg /dL) has increased in the last 7 days    []    Subject Unwilling             []     Investigator Discretion           []   Explain:       Record Bolus Insulin Doses and Mealtime Carbohydrate Values Leading into Visit in Table Below:     Day 1  Day 2  Day 3  Did Subject Correctly Calculate and Follow Carb to Insulin Dosing Guidance? (comment for each meal)     Date  20Nov2024  (dd/mmm/yyyy)  19Nov2024  (dd/mmm/yyyy)  18Nov2024   (dd/mmm/yyyy)  N/A Breakfast Bolus Dose   (Carb to Insulin Ratio from previous week)   _________  Time of Day of Breakfast Bolus 0710  (24 hour-clock)  0805  (24 hour-clock)  0731  (24 hour-clock)    see provider note    Bolus Value (Units)  18 12 16      Meal Time  0710  (24 hour-clock)  0815  (24 hour-clock)  0731  (24 hour-clock)      Meal Carb Value (g) 90 67 80     Current Carb:Insulin Ratio 5 5.6 5    Lunch Bolus Dose    (Carb to Insulin Ratio from previous week)   _________  Time of Day of Lunch Bolus 1532  (24 hour-clock)  1123  (24 hour-clock)  1254  (24 hour-clock)   see provider note     Bolus Value (Units)  17 20 20      Meal Time  1532  (24 hour-clock)  1123  (24 hour-clock)  1254  (24 hour-clock)      Meal Carb Value (g) 82 98 98      Current Carb:Insulin Ratio  4.8  4.9  4.9    Dinner Bolus Dose    (Carb to Insulin Ratio from previous week)   _________  Time of Day of Dinner Bolus 1849  (24 hour-clock)  1952   (24 hour-clock)  1802  (24 hour-clock)   see provider note     Bolus Value (Units)  14 5 15      Meal Time  1849  (24 hour-clock)  1951  (24 hour-clock)  1802  (24 hour-clock)      Meal Carb Value (g) 69 65 80     Current Carb: Insulin Ratio   4.9  13  5.3       Identify the Subject???s 2-Hour Postprandial Glucose Values Leading into Titration Visit:      Note: To find the Median 2-hour Postprandial  Glucose (PPG) for a Meal, you must find the glucose value that occurs 2 hours after the mealtime stamp entered by the subject.        CARB:INSULIN RATIO BOLUS CALCULATIONS            Day 1  Day 2  Day 3    Date (most complete previous 3 days)  20Nov2024 (dd/mmm/yyyy)  19Nov2024  (dd/mmm/yyyy)  18Nov2024 (dd/mmm/yyyy)    Breakfast 2-Hour Postprandial Glucose    Glucose Value   99 133 132    PPG TIme  0910  (24 hour-clock)  1015   (24 hour-clock)  0930  (24 hour-clock)    Lunch 2-Hour Postprandial Glucose       Glucose Value   133 119 262    PPG Time     1730   (24 hour-clock)  1325   (24 hour-clock)  1455  (24 hour-clock) Dinner 2-Hour Postprandial Glucose       Glucose Value  193 93 88    PPG Time     2050  (24 hour-clock)  2150  (24 hour-clock)  2000   (24 hour-clock)  Subject CARB:INSULIN RATIOS used from previous week:      Breakfast Lunch Dinner   Median 2-hour PPG (previous week) 247 151 86   Median 2-hour PPG from week leading into Visit 132 133 93   Carb:Insulin Ratios used from Previous Visit 5 5 5    New Carb:Insulin Ratios  see provider note  see provider note  see provider note        If Investigator chose to deviate from protocol recommendation, please indicate why:    Subject had a documented Level 2 Hypoglycemia (<54 mg/dL) in the last 7 days          []    Subject frequency of Level 1 Hypoglycemia (70mg /dL) has increased in the last 7 days        []    Subject Unwilling                     []    Investigator Discretion           []  Explain:      BOLUS INSULIN ADJUSTMENT PROTOCOL:    GOAL IS 2-HOUR POST-PRANDIAL BLOOD SUGAR < 140mg /dL. Titration detail is provided below:      The following Table will be used to determine any needed change in Carb to Insulin ratio.   In the table, the value of the ratio currently in use is given as ???Xcurrent???, and the adjusted value (determined based on 2-   hour postprandial glucose values, assessed by inspection of CGM profiles) is given as ???Xnew???.       NOTE: the decision to change the Carb to Insulin ratio should be based on three or more days??? worth of data for each meal in question, using the MEDIAN 2-hour post prandial glucose (not the MEAN).      Bolus Insulin Dosing Algorithm:   Algorithm for Adjusting Bolus Insulin     2-hour Postprandial Glucose (MEDIAN over 3 days)  1 Unit Insulin per   X gram Carbs    <100 mg/dL  Xnew = 1.6 of Xcurrent    100-140 mg/dL  Xnew = Xcurrent    518-841 mg/dL  Xnew = 0.8 of Xcurrent    160-200 mg/dL  Xnew = 0.7 of Xcurrent    >200 mg/dL  Xnew = 0.5 of Xcurrent

## 2023-09-24 NOTE — Unmapped (Signed)
[facility] Appointment Note  Study:  OPTI-2  Description: A Phase 2b Randomized, Double-blind Trial Comparing HDVInsulin Lispro Versus Insulin Lispro Alone in Adults with Type 1 Diabetes Receiving Insulin Degludec   Protocol:  DP 11-2021-01   Site Number: 05  [place] IRB#: [id number]  PI: Noelle Penner, MD, PhD  Visit: Dose Optimization Visits 7  Source Version:1      Patient: Sandra Underwood  Date: 21Nov2024  DOB: 12-10-2000    Checklist:    X AE Review   X Concomitant Medications Review   N/A Vital Signs for in-person visits (V4,V8)   N/A Pregnancy Test  for in-person visits (V4,V8)   N/A Supply dispensing as needed (V4,V8)   X Hypoglycemic Symptoms Log Review (see provider note)   X CGM Data Sufficiency Review (see provider note)   X Insulin Dose and Meal Timing Review (see provider note)   X Clarity Reviews (fasting glucose, 2-hour post prandial blood glucose, meal dose, compliance) (see provider note)   X Insulin updates (see provider note)       Adverse Events  Has the subject experienced any Adverse Events since signing the Informed Consent?   []   Yes (If 'Yes', please complete an Adverse Events Form)  [x]   No    Has the subject taken any new medications or changed any medication usage since the last visit? No.(If ???Yes???, please complete a Concomitant Medications Form)   Medication Name  (Generic / Trade Name) Dose and Frequency Start Date  (dd/mmm/yyyy) Stop Date  (dd/mmm/yyyy) Class of Medication  Primary Indication             Hypoglycemic Symptoms Log     *Note that paper hypoglycemic symptoms log should be collected at in -person visits and data entered by site staff at that time. Please remind subject to bring the paper hypoglycemic symptoms log to the next in-person visit.      Was the Hypoglycemic Symptoms Log reviewed?   No. Reason: not in person.   Were any Hypoglycemic Symptoms experienced?   Yes. Reason: not counting carbs accurately.     CGM DATA SUFFICIENCY REVIEW    The ???Days with CGM Data??? value is located in the Clarity Software on the Subject???s Data page, on the right side of the screen. Please enter the % value noted for ???Days with CGM Data??? for the Date Range selected. Review the Clarity Software for date ranges with a value of >= 80%.       Days with CGM Data  93 %    Reminder:  There are TWO PARTS to these visits where insulin doses are titrated:    Titration Part 1 is recording data out of Clarity, reviewing it, and following both basal and bolus algorithms for titration.  This should be completed prior to the titration discussion with the subject.        Titration Part 2 is the visit with the patient, during which the titration recommendations are reviewed and implemented.        Note: These titration visits will only be completed during the Dose Optimization Period of the Study (not during the Maintenance Period, unless necessary and requested or indicated by Investigator).        Throughout the entirety of the study, including during run-in, Subjects should be recording the following Event information in real time into the G7 Phone App:   All Insulin doses (basal and bolus)   All MAIN Meal times, including carbohydrate  value of meal. This is typically Breakfast, Lunch, and Dinner, all meals >15g of carbohydrate but should include at least 2 Main Meals per day   Additionally, all ???feeling low??? events must be recorded in the Dexcom G7 App and recorded in the hypoglycemic symptoms log.    Please note that subject payment will be tied to completeness of data, and please also note that if a Subject tries to back-enter/back-fill information at a later date, the date/time stamps will not be correct in the system (they are not editable), and the data will not be usable.      Please remind the Subject not to change their carb to insulin ratios or basal insulin doses unless instructed to do so by the Investigator.     Martin Majestic, CMA

## 2023-09-24 NOTE — Unmapped (Addendum)
 [facility] Appointment Note  Study:  OPTI-2  Description: A Phase 2b Randomized, Double-blind Trial Comparing HDVInsulin Lispro Versus Insulin Lispro Alone in Adults with Type 1 Diabetes Receiving Insulin Degludec   Protocol:  DP 11-2021-01   Site Number: 05  [place] IRB#: [id number]  PI: Noelle Penner, MD, PhD  Visit: 7  Source Version:1      Patient: Sandra Underwood  Date: 21Nov2024  DOB: 10/04/2001    A large portion of this work will be completed and reviewed by the healthcare provider prior to the visit by using the data reviewed in the Clarity software.        Was the Subject???s data reviewed in the Clarity Software prior to Scheduled Visit Date?  []   Yes Date:  [x]   No     Was the Hypoglycemic Symptoms Log discussed with the subject? *Note that paper hypoglycemic symptoms log should be collected at in -person visits and data entered by site staff at that time. Please remind subject to bring the paper hypoglycemic symptoms log to the next in-person visit.      [x]   Yes, by FG  []   No    TITRATION PART 1: INSULIN DOSING AND MEALTIME REVIEW      Recording Event Data from Software into Source Documentation      Reminder: a MAIN MEAL is any meal that is >15g of carbohydrate, and NOT a ???snack??? meal or bolus associated with a snack or snack correction. In the table below, record the basal and bolus insulin doses, meal times, and carbohydrate values for the ???most complete??? 3 days leading into the visit.    Please remind the Subject not to change their carb to insulin ratios or basal insulin doses unless instructed to do so by the Investigator.     During the Visit, confirm with the Subject that the 3 days recorded are a complete record for each day (including insulin doses, times, and insulin types, as well as ???meals???/carb events--quantity of carbohydrates and time).   Note: Site staff to use best judgment as to whether data are complete.        Note: Refer to Clarity Guide for Information on Collecting Fasting and Post-Meal Glucose Values.  All Clarity-recorded glucose values used for titration should be screenshotted, initialed, and filed in the subject binder.      BASAL DOSING REVIEW AND TITRATION    Subject Basal Dose from Previous Week    This value was recommended by the PI in the previous visit.  Please check the Clarity Software to verify that basal dose is consistent and enter the Subject???s basal dose for last week below. Please note that the subject should not be changing their basal doses day-to-day, so the dose should be the same for all recorded days.    Subject Basal Dose from Previous Week (Week leading into visit)  32U        Record Basal Insulin Doses Leading into Visit in Table Below:   BASAL INSULIN DOSES (Week leading into visit)        Median Basal Dose for 3 days Leading Into Visit      Day 1  Day 2  Day 3      Date  20Nov2024     (dd/mmm/yyyy)  19Nov2024   (dd/mmm/yyyy)  18Nov2024   (dd/mmm/yyyy)  N/A    Basal Dose   Time of Day     2043  (24 hour-clock)  2102  (24  hour-clock)  2127  (24 hour-clock)  N/A     Value (Units)  30 30 30 30         Identify the Subject???s Median Fasting Blood Glucose Leading into Titration Visit:    The Median Fasting Blood Glucose Value is the middle number in a set of three values.?To find these values, review the Subject???s daily glucose plots in provided software and find the lowest fasting (pre-breakfast) glucose value for the three days within the week leading up to the visit/call day. Please refer to the software training documentation provided for further detail on where exactly to find these values.    Fasting glucose is defined as the lowest CGM value observed between 2:00 AM and start of the breakfast meal or first meal, with at least 8 hours of fasting.  Enter the three values below and sort in order of lowest to highest to determine the Median Fasting Blood Glucose Value prior to making basal dosing recommendation changes.    Fasting Blood Glucose Value Date mg/dl Time of recorded FBG   1 20Nov2024 184 0655   2 19Nov2024 152 0800   3 18Nov2024 139 0720   Please identify the Median of these Values by ordering them from low to high and selecting the middle value (see example below):    Median Fasting Blood Glucose Value   152 mg/dl      Use the median Fasting Blood Glucose from above and the algorithm below to determine the subject???s new basal dose, with the goal of achieving a fasting blood glucose between 80mg /dL and 272 mg/dL.     Algorithm for adjusting basal insulin Degludec    3-Day median fasting    glucose level  Adjustment    to insulin    Degludec  Amount    <80 mg/dL or had an unexplained episode of Level 2 fasting or pre-meal hypoglycemia (i.e., <54 mg/dL) occurs   Reduce by  2 U    80-110 mg/dL  No Change      536-644 mg/dL  Increase by  2 U    034-742 mg/dL  Increase by  4 U    595-638 mg/dL  Increase by  6 U    >756 mg/dL  Increase by  8 U      Degludec Protocol Dosing Recommendation (mark one)   (Please review dosing algorithm table above)    Decreased by 2 units []      No Change               []    Increased by 2 units [x]    Increased by 4 units []    Increased by 6 units []    Increased by 8 units []     New Basal Dose for Subject:                                      32 U      If Investigator chose to deviate from protocol recommendation, please indicate why:       Subject had a documented Level 2 Hypoglycemia (<54 mg/dL) in the last 7 days    [x]      Subject frequency of Level 1 Hypoglycemia (70mg /dL) has increased in the last 7 days    []    Subject Unwilling             [x]     Teaching laboratory technician           [  x]Explain: increasing by 2u per patient preference and because of hypoglycemia      Record Bolus Insulin Doses and Mealtime Carbohydrate Values Leading into Visit in Table Below:     Day 1  Day 2  Day 3  Did Subject Correctly Calculate and Follow Carb to Insulin Dosing Guidance? (comment for each meal)     Date 20Nov2024  (dd/mmm/yyyy)  19Nov2024  (dd/mmm/yyyy)  18Nov2024   (dd/mmm/yyyy)  N/A    Breakfast Bolus Dose   (Carb to Insulin Ratio from previous week)   _________  Time of Day of Breakfast Bolus 0710  (24 hour-clock)  0805  (24 hour-clock)  0731  (24 hour-clock)   she under dosed slightly on Day 2, but followed the ICR correctly on days 1 and 3    Bolus Value (Units)  18 12 16      Meal Time  0710  (24 hour-clock)  0815  (24 hour-clock)  0731  (24 hour-clock)      Meal Carb Value (g) 90 67 80     Current Carb:Insulin Ratio 5 5.6 5    Lunch Bolus Dose    (Carb to Insulin Ratio from previous week)   _________  Time of Day of Lunch Bolus 1532  (24 hour-clock)  1123  (24 hour-clock)  1254  (24 hour-clock)  yes     Bolus Value (Units)  17 20 20      Meal Time  1532  (24 hour-clock)  1123  (24 hour-clock)  1254  (24 hour-clock)      Meal Carb Value (g) 82 98 98      Current Carb:Insulin Ratio  4.8  4.9  4.9    Dinner Bolus Dose    (Carb to Insulin Ratio from previous week)   _________  Time of Day of Dinner Bolus 1849  (24 hour-clock)  1952   (24 hour-clock)  1802  (24 hour-clock)  Yes on days 1 and 3, but she under dosed considerably on day 2     Bolus Value (Units)  14 5 15      Meal Time  1849  (24 hour-clock)  1951  (24 hour-clock)  1802  (24 hour-clock)      Meal Carb Value (g) 69 65 80     Current Carb: Insulin Ratio   4.9  13  5.3       Identify the Subject???s 2-Hour Postprandial Glucose Values Leading into Titration Visit:      Note: To find the Median 2-hour Postprandial  Glucose (PPG) for a Meal, you must find the glucose value that occurs 2 hours after the mealtime stamp entered by the subject.        CARB:INSULIN RATIO BOLUS CALCULATIONS            Day 1  Day 2  Day 3    Date (most complete previous 3 days)  20Nov2024 (dd/mmm/yyyy)  19Nov2024  (dd/mmm/yyyy)  18Nov2024 (dd/mmm/yyyy)    Breakfast 2-Hour Postprandial Glucose    Glucose Value   99 133 132    PPG TIme  0910  (24 hour-clock)  1015   (24 hour-clock) 0930  (24 hour-clock)    Lunch 2-Hour Postprandial Glucose       Glucose Value   133 119 262    PPG Time     1730   (24 hour-clock)  1325   (24 hour-clock)  1455  (24 hour-clock)    Dinner 2-Hour Postprandial Glucose  Glucose Value  193 93 88    PPG Time     2050  (24 hour-clock)  2150  (24 hour-clock)  2000   (24 hour-clock)           Subject CARB:INSULIN RATIOS used from previous week:      Breakfast Lunch Dinner   Median 2-hour PPG (previous week) 247 151 86   Median 2-hour PPG from week leading into Visit 132 133 93   Carb:Insulin Ratios used from Previous Visit 5 5 5    New Carb:Insulin Ratios 5 5 5         If Investigator chose to deviate from protocol recommendation, please indicate why:    Subject had a documented Level 2 Hypoglycemia (<54 mg/dL) in the last 7 days          [x]    Subject frequency of Level 1 Hypoglycemia (70mg /dL) has increased in the last 7 days        []    Subject Unwilling                     []    Investigator Discretion           []  Explain:      BOLUS INSULIN ADJUSTMENT PROTOCOL:    GOAL IS 2-HOUR POST-PRANDIAL BLOOD SUGAR < 140mg /dL. Titration detail is provided below:      The following Table will be used to determine any needed change in Carb to Insulin ratio.   In the table, the value of the ratio currently in use is given as ???Xcurrent???, and the adjusted value (determined based on 2-   hour postprandial glucose values, assessed by inspection of CGM profiles) is given as ???Xnew???.       NOTE: the decision to change the Carb to Insulin ratio should be based on three or more days??? worth of data for each meal in question, using the MEDIAN 2-hour post prandial glucose (not the MEAN).      Bolus Insulin Dosing Algorithm:   Algorithm for Adjusting Bolus Insulin     2-hour Postprandial Glucose (MEDIAN over 3 days)  1 Unit Insulin per   X gram Carbs    <100 mg/dL  Xnew = 1.6 of Xcurrent    100-140 mg/dL  Xnew = Xcurrent    962-952 mg/dL  Xnew = 0.8 of Xcurrent    160-200 mg/dL  Xnew = 0.7 of Xcurrent    >200 mg/dL  Xnew = 0.5 of Xcurrent           I have reviewed the information collected in the source data sheet as it applies to the Subject???s dosing titrations. The titrations have been reviewed with the subject, and I recommend the above dosing titration to be made.       Encouraged patient to adhere to ICR and ISF.  She is often dosing extra units for hyperglycemia which is making her low.  In addition, she is eating 24g CHO for hypoglycemia.  Reeducated on the rule of 15.    This progress note may be timestamped after permission was given to the coordinator to proceed. Eligibility was determined by me, the investigator, prior to moving forward.

## 2023-09-29 NOTE — Unmapped (Addendum)
[facility] Appointment Note  Study:  OPTI-2  Description: A Phase 2b Randomized, Double-blind Trial Comparing HDVInsulin Lispro Versus Insulin Lispro Alone in Adults with Type 1 Diabetes Receiving Insulin Degludec   Protocol:  DP 11-2021-01   Site Number: 05  [place] IRB#: [id number]  PI: Noelle Penner, MD, PhD  Visit: Dose Optimization Visits 8  Source Version:1      Patient: Sandra Underwood  Date: 02Dec2024  DOB: 26-Apr-2001    Checklist:    X AE Review   X Concomitant Medications Review   X Vital Signs for in-person visits (V4,V8)   X Pregnancy Test  for in-person visits (V4,V8)   X Supply dispensing as needed (V4,V8)   X Hypoglycemic Symptoms Log Review (see provider note)   X CGM Data Sufficiency Review (see provider note)   X Insulin Dose and Meal Timing Review (see provider note)   X Clarity Reviews (fasting glucose, 2-hour post prandial blood glucose, meal dose, compliance) (see provider note)   X Insulin updates (see provider note)       Adverse Events  Has the subject experienced any Adverse Events since signing the Informed Consent?   []   Yes (If 'Yes', please complete an Adverse Events Form)  [x]   No    Has the subject taken any new medications or changed any medication usage since the last visit? No.(If ???Yes???, please complete a Concomitant Medications Form)   Medication Name  (Generic / Trade Name) Dose and Frequency Start Date  (dd/mmm/yyyy) Stop Date  (dd/mmm/yyyy) Class of Medication  Primary Indication             Hypoglycemic Symptoms Log     *Note that paper hypoglycemic symptoms log should be collected at in -person visits and data entered by site staff at that time. Please remind subject to bring the paper hypoglycemic symptoms log to the next in-person visit.      Was the Hypoglycemic Symptoms Log reviewed?   No. Reason: did not bring, will bring at next visit.   Were any Hypoglycemic Symptoms experienced?   Yes. Reason: not eating, potentially too much insulin.     CGM DATA SUFFICIENCY REVIEW    The ???Days with CGM Data??? value is located in the Clarity Software on the Subject???s Data page, on the right side of the screen. Please enter the % value noted for ???Days with CGM Data??? for the Date Range selected. Review the Clarity Software for date ranges with a value of >= 80%.       Days with CGM Data  93 %    Reminder:  There are TWO PARTS to these visits where insulin doses are titrated:    Titration Part 1 is recording data out of Clarity, reviewing it, and following both basal and bolus algorithms for titration.  This should be completed prior to the titration discussion with the subject.        Titration Part 2 is the visit with the patient, during which the titration recommendations are reviewed and implemented.        Note: These titration visits will only be completed during the Dose Optimization Period of the Study (not during the Maintenance Period, unless necessary and requested or indicated by Investigator).        Throughout the entirety of the study, including during run-in, Subjects should be recording the following Event information in real time into the G7 Phone App:   All Insulin doses (basal and bolus)  All MAIN Meal times, including carbohydrate value of meal. This is typically Breakfast, Lunch, and Dinner, all meals >15g of carbohydrate but should include at least 2 Main Meals per day   Additionally, all ???feeling low??? events must be recorded in the Dexcom G7 App and recorded in the hypoglycemic symptoms log.    Please note that subject payment will be tied to completeness of data, and please also note that if a Subject tries to back-enter/back-fill information at a later date, the date/time stamps will not be correct in the system (they are not editable), and the data will not be usable.      Please remind the Subject not to change their carb to insulin ratios or basal insulin doses unless instructed to do so by the Investigator.   Vital signs (V4,V8)  Subject must rest 5 minutes before reading BP.     Time: 1051  BP: 105/79  Pulse(beats/min): 99  Oral temperature: 36.8C  Respiratory rate(breaths/min): 16  Weight 85.1kg    Labs(V4,V8)    If WOCBP, was the urine pregnancy test performed? Yes Time: 1056  What was the result? negative    Insulin Dispensing - Visit    Please ensure to capture dispensing information in the Drug Accountability Log.          Was Basal Insulin dispensed?   Yes.   Was Bolus Insulin dispensed?   Yes.   Was HDV-Lispro / sWFI-Lispro prepared as per drug preparation instructions? (0.66mL HDV or sWFI into 10mL vial of Lispro)         Yes.     ID/Quantity  Dispensing Date Return Date ML   [id number] 16Sep2024  N/A N/A   [id number] C 16Sep2024 03Oct2024  4.4      [id number] 03Oct2024 N/A  N/A    [id number] 03Oct2024  02Dec2024  10 returned late, unused   [id number] 03Oct2024  28Oct2024  1    [id number] 28Oct2024  02Dec2024  0    H0623  28Oct2024  02Dec2024  4    V7041 03Jan2025      V7401 03Jan2025            Device and Supply Dispensing Details(V4,V8)     Number of Dexcom G7 sensors dispensed to subject (as needed): 4     Sensor Serial Number(s):  [id number]     Number of insulin syringes dispensed to subject:  150   Number of pen needles dispensed to subject:  33   Number of BGM test strips dispensed to subject:  0   Hypoglycemic Logs 2     Other    Scheduling    Dose Optimization Visit Week 9 (+/-4 days)Phone: 11Dec2024  Dose Optimization Visit Week 10 (+/-4 days)Phone:17Dec2024  Dose Optimization Visit Week 11 (+/-4 days)Phone: 23Dec2024 AM  Dose Optimization Visit Week 12 (+/-4 days)In-Person: 03Jan2024 @1100     Martin Majestic, CMA

## 2023-10-05 ENCOUNTER — Institutional Professional Consult (permissible substitution): Admit: 2023-10-05 | Discharge: 2023-10-06

## 2023-10-05 DIAGNOSIS — Z006 Encounter for examination for normal comparison and control in clinical research program: Principal | ICD-10-CM

## 2023-10-05 MED ORDER — STUDY DP 01-2023-01 HDV-LISPRO U-100 / PLACEBO INJECTION SOLUTION
0 refills | 0 days | Status: CP
Start: 2023-10-05 — End: ?

## 2023-10-05 MED ORDER — STUDY DP 01-2023-01 INSULIN DEGLUDEC 100 UNITS/ML INJECTION PEN
PEN_INJECTOR | 0 refills | 0 days | Status: CP
Start: 2023-10-05 — End: ?

## 2023-10-05 NOTE — Unmapped (Signed)
[facility] Appointment Note  Study:  OPTI-2  Description: A Phase 2b Randomized, Double-blind Trial Comparing HDVInsulin Lispro Versus Insulin Lispro Alone in Adults with Type 1 Diabetes Receiving Insulin Degludec   Protocol:  DP 11-2021-01   Site Number: 05  [facility] IRB#: [id number]  PI: Noelle Penner, MD, PhD  Visit: 8  Source Version:1      Patient: Sandra Underwood  Date: 02Dec2024  DOB: December 19, 2000    A large portion of this work will be completed and reviewed by the healthcare provider prior to the visit by using the data reviewed in the Clarity software.        Was the Subject???s data reviewed in the Clarity Software prior to Scheduled Visit Date?  []   Yes Date:  [x]   No     Was the Hypoglycemic Symptoms Log discussed with the subject? *Note that paper hypoglycemic symptoms log should be collected at in -person visits and data entered by site staff at that time. Please remind subject to bring the paper hypoglycemic symptoms log to the next in-person visit.      []   Yes   [x]   No pt did not bring in her log.   Were any Hypoglycemic Symptoms experienced?   Yes. Reason: low PO intake and high basal dose.     TITRATION PART 1: INSULIN DOSING AND MEALTIME REVIEW      Recording Event Data from Software into Source Documentation      Reminder: a MAIN MEAL is any meal that is >15g of carbohydrate, and NOT a ???snack??? meal or bolus associated with a snack or snack correction. In the table below, record the basal and bolus insulin doses, meal times, and carbohydrate values for the ???most complete??? 3 days leading into the visit.    Please remind the Subject not to change their carb to insulin ratios or basal insulin doses unless instructed to do so by the Investigator.     During the Visit, confirm with the Subject that the 3 days recorded are a complete record for each day (including insulin doses, times, and insulin types, as well as ???meals???/carb events--quantity of carbohydrates and time).   Note: Site staff to use best judgment as to whether data are complete.        Note: Refer to Clarity Guide for Information on Collecting Fasting and Post-Meal Glucose Values.  All Clarity-recorded glucose values used for titration should be screenshotted, initialed, and filed in the subject binder.      BASAL DOSING REVIEW AND TITRATION    Subject Basal Dose from Previous Week    This value was recommended by the PI in the previous visit.  Please check the Clarity Software to verify that basal dose is consistent and enter the Subject???s basal dose for last week below. Please note that the subject should not be changing their basal doses day-to-day, so the dose should be the same for all recorded days.    Subject Basal Dose from Previous Week (Week leading into visit)  32U        Record Basal Insulin Doses Leading into Visit in Table Below:   BASAL INSULIN DOSES (Week leading into visit)        Median Basal Dose for 3 days Leading Into Visit      Day 1  Day 2  Day 3      Date  01Dec2024     (dd/mmm/yyyy)  30Nov2024   (dd/mmm/yyyy)  29Nov2024   (  dd/mmm/yyyy)  N/A    Basal Dose   Time of Day     2101  (24 hour-clock)  2155  (24 hour-clock)  2215  (24 hour-clock)  N/A     Value (Units)  32 32 32 32        Identify the Subject???s Median Fasting Blood Glucose Leading into Titration Visit:    The Median Fasting Blood Glucose Value is the middle number in a set of three values.?To find these values, review the Subject???s daily glucose plots in provided software and find the lowest fasting (pre-breakfast) glucose value for the three days within the week leading up to the visit/call day. Please refer to the software training documentation provided for further detail on where exactly to find these values.    Fasting glucose is defined as the lowest CGM value observed between 2:00 AM and start of the breakfast meal or first meal, with at least 8 hours of fasting.  Enter the three values below and sort in order of lowest to highest to determine the Median Fasting Blood Glucose Value prior to making basal dosing recommendation changes.    Fasting Blood Glucose Value Date mg/dl Time of recorded FBG   1 01Dec2024 57 0921   2 30Nov2024 69 1141   3 29Nov2024 42 0655   Please identify the Median of these Values by ordering them from low to high and selecting the middle value (see example below):    Median Fasting Blood Glucose Value   57 mg/dl      Use the median Fasting Blood Glucose from above and the algorithm below to determine the subject???s new basal dose, with the goal of achieving a fasting blood glucose between 80mg /dL and 841 mg/dL.     Algorithm for adjusting basal insulin Degludec    3-Day median fasting    glucose level  Adjustment    to insulin    Degludec  Amount    <80 mg/dL or had an unexplained episode of Level 2 fasting or pre-meal hypoglycemia (i.e., <54 mg/dL) occurs   Reduce by  2 U    80-110 mg/dL  No Change      324-401 mg/dL  Increase by  2 U    027-253 mg/dL  Increase by  4 U    664-403 mg/dL  Increase by  6 U    >474 mg/dL  Increase by  8 U      Degludec Protocol Dosing Recommendation (mark one)   (Please review dosing algorithm table above)    Decreased by 2 units []      No Change               []    Increased by 2 units []    Increased by 4 units []    Increased by 6 units []    Increased by 8 units []     New Basal Dose for Subject:                                      28 U      If Investigator chose to deviate from protocol recommendation, please indicate why:       Subject had a documented Level 2 Hypoglycemia (<54 mg/dL) in the last 7 days    [x]      Subject frequency of Level 1 Hypoglycemia (70mg /dL) has increased in the last 7 days    []   Subject Unwilling             []     Investigator Discretion           [] Explain:       Record Bolus Insulin Doses and Mealtime Carbohydrate Values Leading into Visit in Table Below:     Day 1  Day 2  Day 3  Did Subject Correctly Calculate and Follow Carb to Insulin Dosing Guidance? (comment for each meal)     Date  01Dec2024  (dd/mmm/yyyy)  30Nov2024  (dd/mmm/yyyy)  27Nov2024 (not enough data on 29th or 28th)   (dd/mmm/yyyy)  N/A    Breakfast Bolus Dose   (Carb to Insulin Ratio from previous week)   _________  Time of Day of Breakfast Bolus 1045  (24 hour-clock)  N/A no breakfast  (24 hour-clock)  0745  (24 hour-clock)   Pt adjusted    Bolus Value (Units)  16 0 16     Meal Time  1046  (24 hour-clock)  N/A no breakfast  (24 hour-clock)  0746  (24 hour-clock)      Meal Carb Value (g) 95 0 80     Current Carb:Insulin Ratio 6 0 5    Lunch Bolus Dose    (Carb to Insulin Ratio from previous week)   _________  Time of Day of Lunch Bolus N/A no lunch  (24 hour-clock)  1229  (24 hour-clock)  N/A no lunch  (24 hour-clock)  Pt adjusted     Bolus Value (Units)  0 12 0     Meal Time  N/A no lunch  (24 hour-clock)  1229  (24 hour-clock)  N/A no lunch  (24 hour-clock)      Meal Carb Value (g) 0 80 0      Current Carb:Insulin Ratio  0  6.7  0    Dinner Bolus Dose    (Carb to Insulin Ratio from previous week)   _________  Time of Day of Dinner Bolus 1717  (24 hour-clock)  N/A no bolus (low*)   (24 hour-clock)  N/A no bolus(low*)  (24 hour-clock)  Pt adjusted     Bolus Value (Units)  15 0 0     Meal Time  1716  (24 hour-clock)  1817  (24 hour-clock)  1829  (24 hour-clock)      Meal Carb Value (g) 70 45 25     Current Carb: Insulin Ratio   4.7  0  0       Identify the Subject???s 2-Hour Postprandial Glucose Values Leading into Titration Visit:      Note: To find the Median 2-hour Postprandial  Glucose (PPG) for a Meal, you must find the glucose value that occurs 2 hours after the mealtime stamp entered by the subject.        CARB:INSULIN RATIO BOLUS CALCULATIONS            Day 1  Day 2  Day 3    Date (most complete previous 3 days)  01Dec2024 (dd/mmm/yyyy)  30Nov2024  (dd/mmm/yyyy)  27Nov2024 (dd/mmm/yyyy)    Breakfast 2-Hour Postprandial Glucose    Glucose Value   83 N/A no breakfast 61    PPG TIme  1246  (24 hour-clock)  N/A   (24 hour-clock)  0945  (24 hour-clock)    Lunch 2-Hour Postprandial Glucose       Glucose Value   N/A no lunch 146 N/A no lunch    PPG Time     N/A  (  24 hour-clock)  1431   (24 hour-clock)  N/A  (24 hour-clock)    Dinner 2-Hour Postprandial Glucose       Glucose Value  132 128(no bolus) 281    PPG Time     1916   (24 hour-clock)  2016  (24 hour-clock)  2030   (24 hour-clock)           Subject CARB:INSULIN RATIOS used from previous week:      Breakfast Lunch Dinner   Median 2-hour PPG (previous week) 132 133 93   Median 2-hour PPG from week leading into Visit 87 146  132   Carb:Insulin Ratios used from Previous Visit 5 5 5    New Carb:Insulin Ratios 8 8 8       If Investigator chose to deviate from protocol recommendation, please indicate why:    Subject had a documented Level 2 Hypoglycemia (<54 mg/dL) in the last 7 days          [x]    Subject frequency of Level 1 Hypoglycemia (70mg /dL) has increased in the last 7 days        []    Subject Unwilling                     []    Investigator Discretion           [x]  Explain: frequent severe hypoglycemia     BOLUS INSULIN ADJUSTMENT PROTOCOL:    GOAL IS 2-HOUR POST-PRANDIAL BLOOD SUGAR < 140mg /dL. Titration detail is provided below:      The following Table will be used to determine any needed change in Carb to Insulin ratio.   In the table, the value of the ratio currently in use is given as ???Xcurrent???, and the adjusted value (determined based on 2-   hour postprandial glucose values, assessed by inspection of CGM profiles) is given as ???Xnew???.       NOTE: the decision to change the Carb to Insulin ratio should be based on three or more days??? worth of data for each meal in question, using the MEDIAN 2-hour post prandial glucose (not the MEAN).      Bolus Insulin Dosing Algorithm:   Algorithm for Adjusting Bolus Insulin     2-hour Postprandial Glucose (MEDIAN over 3 days)  1 Unit Insulin per   X gram Carbs    <100 mg/dL  Xnew = 1.6 of Xcurrent    100-140 mg/dL  Xnew = Xcurrent    308-657 mg/dL  Xnew = 0.8 of Xcurrent    160-200 mg/dL  Xnew = 0.7 of Xcurrent    >200 mg/dL  Xnew = 0.5 of Xcurrent           I have reviewed the information collected in the source data sheet as it applies to the Subject???s dosing titrations. The titrations have been reviewed with the subject, and I recommend the above dosing titration to be made.             This progress note may be timestamped after permission was given to the coordinator to proceed. Eligibility was determined by me, the investigator, prior to moving forward.

## 2023-10-13 NOTE — Unmapped (Incomplete)
[facility] Appointment Note  Study:  OPTI-2  Description: A Phase 2b Randomized, Double-blind Trial Comparing HDVInsulin Lispro Versus Insulin Lispro Alone in Adults with Type 1 Diabetes Receiving Insulin Degludec   Protocol:  DP 11-2021-01   Site Number: 05  [facility] IRB#: [id number]  PI: Noelle Penner, MD, PhD  Visit: 9  Source Version:1      Patient: Sandra Underwood  Date: 11Dec2024  DOB: 01-23-2001    A large portion of this work will be completed and reviewed by the healthcare provider prior to the visit by using the data reviewed in the Clarity software.        Was the Subject???s data reviewed in the Clarity Software prior to Scheduled Visit Date?  [x]   Yes Date:10Dec2024  []   No     Was the Hypoglycemic Symptoms Log discussed with the subject? *Note that paper hypoglycemic symptoms log     TITRATION PART 1: INSULIN DOSING AND MEALTIME REVIEW      Recording Event Data from Software into Source Documentation      Reminder: a MAIN MEAL is any meal that is >15g of carbohydrate, and NOT a ???snack??? meal or bolus associated with a snack or snack correction. In the table below, record the basal and bolus insulin doses, meal times, and carbohydrate values for the ???most complete??? 3 days leading into the visit.    Please remind the Subject not to change their carb to insulin ratios or basal insulin doses unless instructed to do so by the Investigator.     During the Visit, confirm with the Subject that the 3 days recorded are a complete record for each day (including insulin doses, times, and insulin types, as well as ???meals???/carb events--quantity of carbohydrates and time).   Note: Site staff to use best judgment as to whether data are complete.        Note: Refer to Clarity Guide for Information on Collecting Fasting and Post-Meal Glucose Values.  All Clarity-recorded glucose values used for titration should be screenshotted, initialed, and filed in the subject binder.      BASAL DOSING REVIEW AND TITRATION    Subject Basal Dose from Previous Week    This value was recommended by the PI in the previous visit.  Please check the Clarity Software to verify that basal dose is consistent and enter the Subject???s basal dose for last week below. Please note that the subject should not be changing their basal doses day-to-day, so the dose should be the same for all recorded days.    Subject Basal Dose from Previous Week (Week leading into visit)  28U        Record Basal Insulin Doses Leading into Visit in Table Below:   BASAL INSULIN DOSES (Week leading into visit)        Median Basal Dose for 3 days Leading Into Visit      Day 1  Day 2  Day 3      Date  10Dec2024     (dd/mmm/yyyy)  09Dec2024   (dd/mmm/yyyy)  08Dec2024   (dd/mmm/yyyy)  N/A    Basal Dose   Time of Day     2101  (24 hour-clock)  2032  (24 hour-clock)  2039  (24 hour-clock)  N/A     Value (Units)  30 30 30 30         Identify the Subject???s Median Fasting Blood Glucose Leading into Titration Visit:    The Median Fasting Blood  Glucose Value is the middle number in a set of three values.?To find these values, review the Subject???s daily glucose plots in provided software and find the lowest fasting (pre-breakfast) glucose value for the three days within the week leading up to the visit/call day. Please refer to the software training documentation provided for further detail on where exactly to find these values.    Fasting glucose is defined as the lowest CGM value observed between 2:00 AM and start of the breakfast meal or first meal, with at least 8 hours of fasting.  Enter the three values below and sort in order of lowest to highest to determine the Median Fasting Blood Glucose Value prior to making basal dosing recommendation changes.    Fasting Blood Glucose Value Date mg/dl Time of recorded FBG   1 10Dec2024 124 0848   2 09Dec2024 153 0718   3 08Dec2024 126 0208   Please identify the Median of these Values by ordering them from low to high and selecting the middle value (see example below):    Median Fasting Blood Glucose Value   126 mg/dl      Use the median Fasting Blood Glucose from above and the algorithm below to determine the subject???s new basal dose, with the goal of achieving a fasting blood glucose between 80mg /dL and 130 mg/dL.     Algorithm for adjusting basal insulin Degludec    3-Day median fasting    glucose level  Adjustment    to insulin    Degludec  Amount    <80 mg/dL or had an unexplained episode of Level 2 fasting or pre-meal hypoglycemia (i.e., <54 mg/dL) occurs   Reduce by  2 U    80-110 mg/dL  No Change      865-784 mg/dL  Increase by  2 U    696-295 mg/dL  Increase by  4 U    284-132 mg/dL  Increase by  6 U    >440 mg/dL  Increase by  8 U      Degludec Protocol Dosing Recommendation (mark one)   (Please review dosing algorithm table above)    Decreased by 2 units []      No Change               []    Increased by 2 units []    Increased by 4 units []    Increased by 6 units []    Increased by 8 units []     New Basal Dose for Subject:                                      see provider note U      If Investigator chose to deviate from protocol recommendation, please indicate why:       Subject had a documented Level 2 Hypoglycemia (<54 mg/dL) in the last 7 days    []      Subject frequency of Level 1 Hypoglycemia (70mg /dL) has increased in the last 7 days    []    Subject Unwilling             []     Investigator Discretion           [] Explain:       Record Bolus Insulin Doses and Mealtime Carbohydrate Values Leading into Visit in Table Below:     Day 1  Day 2  Day 3  Did Subject  Correctly Calculate and Follow Carb to Insulin Dosing Guidance? (comment for each meal)     Date  09Dec2024  (dd/mmm/yyyy)  08Dec2024  (dd/mmm/yyyy)  07Dec2024   (dd/mmm/yyyy)  N/A    Breakfast Bolus Dose   (Carb to Insulin Ratio from previous week)   _________  Time of Day of Breakfast Bolus N/A no breakfast  (24 hour-clock)  1130  (24 hour-clock)  0954  (24 hour-clock)   see provider note    Bolus Value (Units)  0 13 13     Meal Time  N/A no breakfast  (24 hour-clock)  1130  (24 hour-clock)  0954  (24 hour-clock)      Meal Carb Value (g) 0 102 104     Current Carb:Insulin Ratio 0 7.8 8    Lunch Bolus Dose    (Carb to Insulin Ratio from previous week)   _________  Time of Day of Lunch Bolus 1130  (24 hour-clock)  N/A no lunch  (24 hour-clock)  1522  (24 hour-clock)  see provider note     Bolus Value (Units)  13 0 14     Meal Time  1130  (24 hour-clock)  N/A no lunch  (24 hour-clock)  1523  (24 hour-clock)      Meal Carb Value (g) 102 0 110      Current Carb:Insulin Ratio  7.8  0  7.9    Dinner Bolus Dose    (Carb to Insulin Ratio from previous week)   _________  Time of Day of Dinner Bolus N/A no bolus  (24 hour-clock)  N/A did not bolus   (24 hour-clock)  N/A no dinner  (24 hour-clock)  see provider note     Bolus Value (Units)  0 0 0     Meal Time  1813  (24 hour-clock)  1813  (24 hour-clock)  N/A no dinner  (24 hour-clock)      Meal Carb Value (g) 55 55 0     Current Carb: Insulin Ratio   0  0  0       Identify the Subject???s 2-Hour Postprandial Glucose Values Leading into Titration Visit:      Note: To find the Median 2-hour Postprandial  Glucose (PPG) for a Meal, you must find the glucose value that occurs 2 hours after the mealtime stamp entered by the subject.        CARB:INSULIN RATIO BOLUS CALCULATIONS            Day 1  Day 2  Day 3    Date (most complete previous 3 days)  10Dec2024 (dd/mmm/yyyy)  09Dec2024  (dd/mmm/yyyy)  08Dec2024 (dd/mmm/yyyy)    Breakfast 2-Hour Postprandial Glucose    Glucose Value   N/A no breakfast 228 246    PPG TIme  N/A  (24 hour-clock)  1328   (24 hour-clock)  1153  (24 hour-clock)    Lunch 2-Hour Postprandial Glucose       Glucose Value   228 N/A no lunch 108    PPG Time     1328   (24 hour-clock)  N/A no lunch   (24 hour-clock)  1723  (24 hour-clock)    Dinner 2-Hour Postprandial Glucose       Glucose Value  303 303(did not bolus) N/A no dinner    PPG Time     2013  (24 hour-clock)  2013  (24 hour-clock)  N/A no dinner   (24 hour-clock)  Subject CARB:INSULIN RATIOS used from previous week:      Breakfast Lunch Dinner   Median 2-hour PPG (previous week) 87 146 132   Median 2-hour PPG from week leading into Visit 237 168 303   Carb:Insulin Ratios used from Previous Visit 8 8 8    New Carb:Insulin Ratios see provider note see provider note see provider note         If Investigator chose to deviate from protocol recommendation, please indicate why:    Subject had a documented Level 2 Hypoglycemia (<54 mg/dL) in the last 7 days          []    Subject frequency of Level 1 Hypoglycemia (70mg /dL) has increased in the last 7 days        []    Subject Unwilling                     []    Investigator Discretion           []  Explain:      BOLUS INSULIN ADJUSTMENT PROTOCOL:    GOAL IS 2-HOUR POST-PRANDIAL BLOOD SUGAR < 140mg /dL. Titration detail is provided below:      The following Table will be used to determine any needed change in Carb to Insulin ratio.   In the table, the value of the ratio currently in use is given as ???Xcurrent???, and the adjusted value (determined based on 2-   hour postprandial glucose values, assessed by inspection of CGM profiles) is given as ???Xnew???.       NOTE: the decision to change the Carb to Insulin ratio should be based on three or more days??? worth of data for each meal in question, using the MEDIAN 2-hour post prandial glucose (not the MEAN).      Bolus Insulin Dosing Algorithm:   Algorithm for Adjusting Bolus Insulin     2-hour Postprandial Glucose (MEDIAN over 3 days)  1 Unit Insulin per   X gram Carbs    <100 mg/dL  Xnew = 1.6 of Xcurrent    100-140 mg/dL  Xnew = Xcurrent    295-284 mg/dL  Xnew = 0.8 of Xcurrent    160-200 mg/dL  Xnew = 0.7 of Xcurrent    >200 mg/dL  Xnew = 0.5 of Xcurrent Xnew = 0.7 of Xcurrent    >200 mg/dL  Xnew = 0.5 of Xcurrent           I have reviewed the information collected in the source data sheet as it applies to the Subject???s dosing titrations. The titrations have been reviewed with the subject, and I recommend the above dosing titration to be made.         ***    This progress note may be timestamped after permission was given to the coordinator to proceed. Eligibility was determined by me, the investigator, prior to moving forward.

## 2023-10-13 NOTE — Unmapped (Signed)
[facility] Appointment Note  Study:  OPTI-2  Description: A Phase 2b Randomized, Double-blind Trial Comparing HDVInsulin Lispro Versus Insulin Lispro Alone in Adults with Type 1 Diabetes Receiving Insulin Degludec   Protocol:  DP 11-2021-01   Site Number: 05  [place] IRB#: [id number]  PI: Noelle Penner, MD, PhD  Visit: Dose Optimization Visits 9  Source Version:1      Patient: Sandra Underwood  Date: 11Dec2024  DOB: 2001-06-04    Checklist:    X AE Review   X Concomitant Medications Review   N/A Vital Signs for in-person visits (V4,V8)   N/A Pregnancy Test  for in-person visits (V4,V8)   N/A Supply dispensing as needed (V4,V8)   X Hypoglycemic Symptoms Log Review (see provider note)   X CGM Data Sufficiency Review (see provider note)   X Insulin Dose and Meal Timing Review (see provider note)   X Clarity Reviews (fasting glucose, 2-hour post prandial blood glucose, meal dose, compliance) (see provider note)   X Insulin updates (see provider note)       Adverse Events  Has the subject experienced any Adverse Events since signing the Informed Consent?   []   Yes (If 'Yes', please complete an Adverse Events Form)  [x]   No    Has the subject taken any new medications or changed any medication usage since the last visit? No.(If ???Yes???, please complete a Concomitant Medications Form)   Medication Name  (Generic / Trade Name) Dose and Frequency Start Date  (dd/mmm/yyyy) Stop Date  (dd/mmm/yyyy) Class of Medication  Primary Indication             Hypoglycemic Symptoms Log     *Note that paper hypoglycemic symptoms log should be collected at in -person visits and data entered by site staff at that time. Please remind subject to bring the paper hypoglycemic symptoms log to the next in-person visit.      Was the Hypoglycemic Symptoms Log reviewed?   No. Reason: not in person.   Were any Hypoglycemic Symptoms experienced?   See provider note     CGM DATA SUFFICIENCY REVIEW    The ???Days with CGM Data??? value is located in the Clarity Software on the Subject???s Data page, on the right side of the screen. Please enter the % value noted for ???Days with CGM Data??? for the Date Range selected. Review the Clarity Software for date ranges with a value of >= 80%.       Days with CGM Data  100 %    Reminder:  There are TWO PARTS to these visits where insulin doses are titrated:    Titration Part 1 is recording data out of Clarity, reviewing it, and following both basal and bolus algorithms for titration.  This should be completed prior to the titration discussion with the subject.        Titration Part 2 is the visit with the patient, during which the titration recommendations are reviewed and implemented.        Note: These titration visits will only be completed during the Dose Optimization Period of the Study (not during the Maintenance Period, unless necessary and requested or indicated by Investigator).        Throughout the entirety of the study, including during run-in, Subjects should be recording the following Event information in real time into the G7 Phone App:   All Insulin doses (basal and bolus)   All MAIN Meal times, including carbohydrate value of meal.  This is typically Breakfast, Lunch, and Dinner, all meals >15g of carbohydrate but should include at least 2 Main Meals per day   Additionally, all ???feeling low??? events must be recorded in the Dexcom G7 App and recorded in the hypoglycemic symptoms log.    Please note that subject payment will be tied to completeness of data, and please also note that if a Subject tries to back-enter/back-fill information at a later date, the date/time stamps will not be correct in the system (they are not editable), and the data will not be usable.      Please remind the Subject not to change their carb to insulin ratios or basal insulin doses unless instructed to do so by the Investigator.     Martin Majestic, CMA

## 2023-10-14 DIAGNOSIS — Z006 Encounter for examination for normal comparison and control in clinical research program: Principal | ICD-10-CM

## 2023-10-14 DIAGNOSIS — E1065 Type 1 diabetes mellitus with hyperglycemia: Principal | ICD-10-CM

## 2023-10-14 NOTE — Unmapped (Signed)
[facility] Appointment Note  Study:  OPTI-2  Description: A Phase 2b Randomized, Double-blind Trial Comparing HDVInsulin Lispro Versus Insulin Lispro Alone in Adults with Type 1 Diabetes Receiving Insulin Degludec   Protocol:  DP 11-2021-01   Site Number: 05  [place] IRB#: [id number]  PI: Noelle Penner, MD, PhD  Visit: 9  Source Version:1        Patient: Sandra Underwood  Date: 11Dec2024  DOB: 08/07/2001     A large portion of this work will be completed and reviewed by the healthcare provider prior to the visit by using the data reviewed in the Clarity software.        Was the Subject???s data reviewed in the Clarity Software prior to Scheduled Visit Date?  [x]   Yes Date:10Dec2024  []   No      Was the Hypoglycemic Symptoms Log discussed with the subject? *Note that paper hypoglycemic symptoms log should be collected at in -person visits and data entered by site staff at that time. Please remind subject to bring the paper hypoglycemic symptoms log to the next in-person visit.      []   Yes   [x]   No  Were any Hypoglycemic Symptoms experienced?   Yes. Reason: hypoglycemia.      TITRATION PART 1: INSULIN DOSING AND MEALTIME REVIEW      Recording Event Data from Software into Source Documentation      Reminder: a MAIN MEAL is any meal that is >15g of carbohydrate, and NOT a ???snack??? meal or bolus associated with a snack or snack correction. In the table below, record the basal and bolus insulin doses, meal times, and carbohydrate values for the ???most complete??? 3 days leading into the visit.    Please remind the Subject not to change their carb to insulin ratios or basal insulin doses unless instructed to do so by the Investigator.     During the Visit, confirm with the Subject that the 3 days recorded are a complete record for each day (including insulin doses, times, and insulin types, as well as ???meals???/carb events--quantity of carbohydrates and time).   Note: Site staff to use best judgment as to whether data are complete.        Note: Refer to Clarity Guide for Information on Collecting Fasting and Post-Meal Glucose Values.  All Clarity-recorded glucose values used for titration should be screenshotted, initialed, and filed in the subject binder.          BASAL DOSING REVIEW AND TITRATION    Subject Basal Dose from Previous Week    This value was recommended by the PI in the previous visit.  Please check the Clarity Software to verify that basal dose is consistent and enter the Subject???s basal dose for last week below. Please note that the subject should not be changing their basal doses day-to-day, so the dose should be the same for all recorded days.    Subject Basal Dose from Previous Week (Week leading into visit)  28U          Record Basal Insulin Doses Leading into Visit in Table Below:           BASAL INSULIN DOSES (Week leading into visit)        Median Basal Dose for 3 days Leading Into Visit      Day 1  Day 2  Day 3      Date  10Dec2024     (dd/mmm/yyyy)  09Dec2024   (  dd/mmm/yyyy)  08Dec2024   (dd/mmm/yyyy)  N/A    Basal Dose   Time of Day     2101  (24 hour-clock)  2032  (24 hour-clock)  2039  (24 hour-clock)  N/A     Value (Units)  30 30 30 30              Identify the Subject???s Median Fasting Blood Glucose Leading into Titration Visit:    The Median Fasting Blood Glucose Value is the middle number in a set of three values.?To find these values, review the Subject???s daily glucose plots in provided software and find the lowest fasting (pre-breakfast) glucose value for the three days within the week leading up to the visit/call day. Please refer to the software training documentation provided for further detail on where exactly to find these values.    Fasting glucose is defined as the lowest CGM value observed between 2:00 AM and start of the breakfast meal or first meal, with at least 8 hours of fasting.  Enter the three values below and sort in order of lowest to highest to determine the Median Fasting Blood Glucose Value prior to making basal dosing recommendation changes.    Fasting Blood Glucose Value Date mg/dl Time of recorded FBG   1 10Dec2024 124 0848   2 09Dec2024 153 0718   3 08Dec2024 126 0208   Please identify the Median of these Values by ordering them from low to high and selecting the middle value (see example below):    Median Fasting Blood Glucose Value   126 mg/dl       Use the median Fasting Blood Glucose from above and the algorithm below to determine the subject???s new basal dose, with the goal of achieving a fasting blood glucose between 80mg /dL and 578 mg/dL.           Algorithm for adjusting basal insulin Degludec    3-Day median fasting    glucose level  Adjustment    to insulin    Degludec  Amount    <80 mg/dL or had an unexplained episode of Level 2 fasting or pre-meal hypoglycemia (i.e., <54 mg/dL) occurs   Reduce by  2 U    80-110 mg/dL  No Change      469-629 mg/dL  Increase by  2 U    528-413 mg/dL  Increase by  4 U    244-010 mg/dL  Increase by  6 U    >272 mg/dL  Increase by  8 U       Degludec Protocol Dosing Recommendation (mark one)   (Please review dosing algorithm table above)    Decreased by 2 units []      No Change               [x]    Increased by 2 units []    Increased by 4 units []    Increased by 6 units []    Increased by 8 units []     New Basal Dose for Subject:                                      30      If Investigator chose to deviate from protocol recommendation, please indicate why:       Subject had a documented Level 2 Hypoglycemia (<54 mg/dL) in the last 7 days               [  x]     Subject frequency of Level 1 Hypoglycemia (70mg /dL) has increased in the last 7 days    []    Subject Unwilling                                                                                                                                            []            Investigator Discretion [x] Explain: had hypo in the prior weeks when [place] was at 32 U          Record Bolus Insulin Doses and Mealtime Carbohydrate Values Leading into Visit in Table Below:             Day 1  Day 2  Day 3  Did Subject Correctly Calculate and Follow Carb to Insulin Dosing Guidance? (comment for each meal)     Date  09Dec2024  (dd/mmm/yyyy)  08Dec2024  (dd/mmm/yyyy)  07Dec2024   (dd/mmm/yyyy)  N/A    Breakfast Bolus Dose   (Carb to Insulin Ratio from previous week)   _________  Time of Day of Breakfast Bolus N/A no breakfast  (24 hour-clock)  1130  (24 hour-clock)  0954  (24 hour-clock)   yes    Bolus Value (Units)  0 13 13     Meal Time  N/A no breakfast  (24 hour-clock)  1130  (24 hour-clock)  0954  (24 hour-clock)      Meal Carb Value (g) 0 102 104      Current Carb:Insulin Ratio 0 7.8 8     Lunch Bolus Dose    (Carb to Insulin Ratio from previous week)   _________  Time of Day of Lunch Bolus 1130  (24 hour-clock)  N/A no lunch  (24 hour-clock)  1522  (24 hour-clock)  yes     Bolus Value (Units)  13 0 14     Meal Time  1130  (24 hour-clock)  N/A no lunch  (24 hour-clock)  1523  (24 hour-clock)      Meal Carb Value (g) 102 0 110      Current Carb:Insulin Ratio  7.8  0  7.9    Dinner Bolus Dose    (Carb to Insulin Ratio from previous week)   _________  Time of Day of Dinner Bolus N/A no bolus  (24 hour-clock)  N/A did not bolus   (24 hour-clock)  N/A no dinner  (24 hour-clock)  no     Bolus Value (Units)  0 0 0     Meal Time  1813  (24 hour-clock)  1813  (24 hour-clock)  N/A no dinner  (24 hour-clock)      Meal Carb Value (g) 55 55 0     Current Carb: Insulin Ratio   0  0  0        Identify the Subject???s 2-Hour Postprandial Glucose  Values Leading into Titration Visit:      Note: To find the Median 2-hour Postprandial  Glucose (PPG) for a Meal, you must find the glucose value that occurs 2 hours after the mealtime stamp entered by the subject.               CARB:INSULIN RATIO BOLUS CALCULATIONS            Day 1  Day 2  Day 3    Date (most complete previous 3 days)  10Dec2024 (dd/mmm/yyyy)  09Dec2024  (dd/mmm/yyyy)  08Dec2024 (dd/mmm/yyyy)    Breakfast 2-Hour Postprandial Glucose    Glucose Value   N/A no breakfast 228 246    PPG TIme  N/A  (24 hour-clock)  1328   (24 hour-clock)  1153  (24 hour-clock)    Lunch 2-Hour Postprandial Glucose       Glucose Value   228 N/A no lunch 108    PPG Time     1328   (24 hour-clock)  N/A no lunch   (24 hour-clock)  1723  (24 hour-clock)    Dinner 2-Hour Postprandial Glucose       Glucose Value  303 303(did not bolus) N/A no dinner    PPG Time     2013  (24 hour-clock)  2013  (24 hour-clock)  N/A no dinner   (24 hour-clock)            Subject CARB:INSULIN RATIOS used from previous week:        Breakfast Lunch Dinner   Median 2-hour PPG (previous week) 87 146 132   Median 2-hour PPG from week leading into Visit 237 168 303   Carb:Insulin Ratios used from Previous Visit 8 8 8    New Carb:Insulin Ratios 6 7 8           If Investigator chose to deviate from protocol recommendation, please indicate why:    Subject had a documented Level 2 Hypoglycemia (<54 mg/dL) in the last 7 days                     [x]    Subject frequency of Level 1 Hypoglycemia (70mg /dL) has increased in the last 7 days        []    Subject Unwilling                                                                                                                                         []    Teaching laboratory technician                                                                                                                                    [  x] Explain:  did not have evaluable dinner, did get low after dinner 12/7 and more or less 12/5     BOLUS INSULIN ADJUSTMENT PROTOCOL:    GOAL IS 2-HOUR POST-PRANDIAL BLOOD SUGAR < 140mg /dL. Titration detail is provided below:      The following Table will be used to determine any needed change in Carb to Insulin ratio.   In the table, the value of the ratio currently in use is given as ???Xcurrent???, and the adjusted value (determined based on 2-   hour postprandial glucose values, assessed by inspection of CGM profiles) is given as ???Xnew???.       NOTE: the decision to change the Carb to Insulin ratio should be based on three or more days??? worth of data for each meal in question, using the MEDIAN 2-hour post prandial glucose (not the MEAN).      Bolus Insulin Dosing Algorithm:       Algorithm for Adjusting Bolus Insulin     2-hour Postprandial Glucose (MEDIAN over 3 days)  1 Unit Insulin per   X gram Carbs    <100 mg/dL  Xnew = 1.6 of Xcurrent    100-140 mg/dL  Xnew = Xcurrent    284-132 mg/dL  Xnew = 0.8 of Xcurrent    160-200 mg/dL  Xnew = 0.7 of Xcurrent    >200 mg/dL  Xnew = 0.5 of Xcurrent             I have reviewed the information collected in the source data sheet as it applies to the Subject???s dosing titrations. The titrations have been reviewed with the subject, and I recommend the above dosing titration to be made.         Discussed with Quandra at 2;15 and she agrees with the plan. Sent text message to confirm dose.       This progress note may be timestamped after permission was given to the coordinator to proceed. Eligibility was determined by me, the investigator, prior to moving forward.

## 2023-10-16 MED ORDER — TACROLIMUS 1 MG CAPSULE, IMMEDIATE-RELEASE
ORAL_CAPSULE | Freq: Two times a day (BID) | ORAL | 2 refills | 30.00 days
Start: 2023-10-16 — End: ?

## 2023-10-16 NOTE — Unmapped (Signed)
[facility] Refill Coordination Note    Specialty Medication(s) to be Shipped:   tacrolimus 1 MG capsule (PROGRAF)    Other medication(s) to be shipped:  Cetirizine     Sandra Underwood, DOB: 04/29/2001  Phone: [phone] (home)       All above HIPAA information was verified with patient.     Was a Nurse, learning disability used for this call? No    Completed refill call assessment today to schedule patient's medication shipment from the [facility]  [phone]).  All relevant notes have been reviewed.     Specialty medication(s) and dose(s) confirmed: Regimen is correct and unchanged.   Changes to medications: [name] reports no changes at this time.  Changes to insurance: No  New side effects reported not previously addressed with a pharmacist or physician: None reported  Questions for the pharmacist: No    Confirmed patient received a Conservation officer, historic buildings and a Surveyor, mining with first shipment. The patient will receive a drug information handout for each medication shipped and additional FDA Medication Guides as required.       DISEASE/MEDICATION-SPECIFIC INFORMATION        N/A    SPECIALTY MEDICATION ADHERENCE     Medication Adherence    Patient reported X missed doses in the last month: 0  Specialty Medication: tacrolimus 1 MG capsule (PROGRAF)  Patient is on additional specialty medications: No              Were doses missed due to medication being on hold? No    tacrolimus 1 MG capsule (PROGRAF): 7 days of medicine on hand     REFERRAL TO PHARMACIST     Referral to the pharmacist: Not needed      [facility]     Shipping address confirmed in Epic.       Delivery Scheduled: Yes, Expected medication delivery date: 10/22/2023.  However, Rx request for refills was sent to the provider as there are none remaining.     Medication will be delivered via UPS to the prescription address in Epic WAM.    Dorisann Frames   [facility] Specialty Technician

## 2023-10-20 NOTE — Unmapped (Signed)
[place] Appointment Note  Study:  OPTI-2  Description: A Phase 2b Randomized, Double-blind Trial Comparing HDVInsulin Lispro Versus Insulin Lispro Alone in Adults with Type 1 Diabetes Receiving Insulin Degludec   Protocol:  DP 11-2021-01   Site Number: 05  [facility] IRB#: [id number]  PI: Noelle Penner, MD, PhD  Visit: 10  Source Version:1      Patient: Sandra Underwood  Date: 17Dec2024  DOB: 10-05-2001    A large portion of this work will be completed and reviewed by the healthcare provider prior to the visit by using the data reviewed in the Clarity software.        Was the Subject???s data reviewed in the Clarity Software prior to Scheduled Visit Date?  [x]   Yes Date:16Dec2024  []   No     Was the Hypoglycemic Symptoms Log discussed with the subject? *Note that paper hypoglycemic symptoms log should be collected at in -person visits and data entered by site staff at that time. Please remind subject to bring the paper hypoglycemic symptoms log to the next in-person visit.      []   Yes   [x]   No  Were any Hypoglycemic Symptoms experienced?   Yes. Reason:  .     TITRATION PART 1: INSULIN DOSING AND MEALTIME REVIEW                  Recording Event Data from Software into Source Documentation      Reminder: a MAIN MEAL is any meal that is >15g of carbohydrate, and NOT a ???snack??? meal or bolus associated with a snack or snack correction. In the table below, record the basal and bolus insulin doses, meal times, and carbohydrate values for the ???most complete??? 3 days leading into the visit.    Please remind the Subject not to change their carb to insulin ratios or basal insulin doses unless instructed to do so by the Investigator.     During the Visit, confirm with the Subject that the 3 days recorded are a complete record for each day (including insulin doses, times, and insulin types, as well as ???meals???/carb events--quantity of carbohydrates and time).   Note: Site staff to use best judgment as to whether data are complete.        Note: Refer to Clarity Guide for Information on Collecting Fasting and Post-Meal Glucose Values.  All Clarity-recorded glucose values used for titration should be screenshotted, initialed, and filed in the subject binder.      BASAL DOSING REVIEW AND TITRATION    Subject Basal Dose from Previous Week    This value was recommended by the PI in the previous visit.  Please check the Clarity Software to verify that basal dose is consistent and enter the Subject???s basal dose for last week below. Please note that the subject should not be changing their basal doses day-to-day, so the dose should be the same for all recorded days.    Subject Basal Dose from Previous Week (Week leading into visit)  30U        Record Basal Insulin Doses Leading into Visit in Table Below:   BASAL INSULIN DOSES (Week leading into visit)        Median Basal Dose for 3 days Leading Into Visit      Day 1  Day 2  Day 3      Date  16Dec2024     (dd/mmm/yyyy)  15Dec2024   (dd/mmm/yyyy)  14Dec2024   (dd/mmm/yyyy)  N/A    Basal Dose   Time of Day     2223  (24 hour-clock)  2056  (24 hour-clock)  2211  (24 hour-clock)  N/A     Value (Units)  30 30 30 30         Identify the Subject???s Median Fasting Blood Glucose Leading into Titration Visit:    The Median Fasting Blood Glucose Value is the middle number in a set of three values.?To find these values, review the Subject???s daily glucose plots in provided software and find the lowest fasting (pre-breakfast) glucose value for the three days within the week leading up to the visit/call day. Please refer to the software training documentation provided for further detail on where exactly to find these values.    Fasting glucose is defined as the lowest CGM value observed between 2:00 AM and start of the breakfast meal or first meal, with at least 8 hours of fasting.  Enter the three values below and sort in order of lowest to highest to determine the Median Fasting Blood Glucose Value prior to making basal dosing recommendation changes.    Fasting Blood Glucose Value Date mg/dl Time of recorded FBG   1 16Dec2024 138 0813   2 15Dec2024 111 0932   3 14Dec2024 66 0917   Please identify the Median of these Values by ordering them from low to high and selecting the middle value (see example below):    Median Fasting Blood Glucose Value   111 mg/dl      Use the median Fasting Blood Glucose from above and the algorithm below to determine the subject???s new basal dose, with the goal of achieving a fasting blood glucose between 80mg /dL and 188 mg/dL.     Algorithm for adjusting basal insulin Degludec    3-Day median fasting    glucose level  Adjustment    to insulin    Degludec  Amount    <80 mg/dL or had an unexplained episode of Level 2 fasting or pre-meal hypoglycemia (i.e., <54 mg/dL) occurs   Reduce by  2 U    80-110 mg/dL  No Change      416-606 mg/dL  Increase by  2 U    301-601 mg/dL  Increase by  4 U    093-235 mg/dL  Increase by  6 U    >573 mg/dL  Increase by  8 U      Degludec Protocol Dosing Recommendation (mark one)   (Please review dosing algorithm table above)    Decreased by 2 units []      No Change               []    Increased by 2 units [x]    Increased by 4 units []    Increased by 6 units []    Increased by 8 units []     New Basal Dose for Subject:                                      30 U  (stay at present dose due to some lows still - see 12/14 and 12/15 - 40s)     If Investigator chose to deviate from protocol recommendation, please indicate why:       Subject had a documented Level 2 Hypoglycemia (<54 mg/dL) in the last 7 days    [x]      Subject frequency of Level 1  Hypoglycemia (70mg /dL) has increased in the last 7 days    []    Subject Unwilling             []     Investigator Discretion           [] Explain:       Record Bolus Insulin Doses and Mealtime Carbohydrate Values Leading into Visit in Table Below:     Day 1  Day 2  Day 3  Did Subject Correctly Calculate and Follow Carb to Insulin Dosing Guidance? (comment for each meal)     Date  16Dec2024  (dd/mmm/yyyy)  15Dec2024  (dd/mmm/yyyy)  14Dec2024   (dd/mmm/yyyy)  N/A    Breakfast Bolus Dose   (Carb to Insulin Ratio from previous week)   _________  Time of Day of Breakfast Bolus 0840  (24 hour-clock)  1029  (24 hour-clock)  0900  (24 hour-clock)       Bolus Value (Units)  10 18 15      Meal Time  0840  (24 hour-clock)  1029  (24 hour-clock)  0900  (24 hour-clock)      Meal Carb Value (g) 52 85 78     Current Carb:Insulin Ratio 5.2 4.7 5.2    Lunch Bolus Dose    (Carb to Insulin Ratio from previous week)   _________  Time of Day of Lunch Bolus 1221  (24 hour-clock)  N/A no lunch  (24 hour-clock)  N/A no lunch  (24 hour-clock)       Bolus Value (Units)  12 0 0     Meal Time  1221  (24 hour-clock)  N/A no lunch  (24 hour-clock)  N/A no lunch  (24 hour-clock)      Meal Carb Value (g) 78 0 0      Current Carb:Insulin Ratio  6.5  0  0    Dinner Bolus Dose    (Carb to Insulin Ratio from previous week)   _________  Time of Day of Dinner Bolus 1924  (24 hour-clock)  1706   (24 hour-clock)  1605  (24 hour-clock)       Bolus Value (Units)  14 12 13      Meal Time  1924  (24 hour-clock)  1706  (24 hour-clock)  1630  (24 hour-clock)      Meal Carb Value (g) 82 73 88     Current Carb: Insulin Ratio   5.9  6.1  6.8       Identify the Subject???s 2-Hour Postprandial Glucose Values Leading into Titration Visit:      Note: To find the Median 2-hour Postprandial  Glucose (PPG) for a Meal, you must find the glucose value that occurs 2 hours after the mealtime stamp entered by the subject.        CARB:INSULIN RATIO BOLUS CALCULATIONS            Day 1  Day 2  Day 3    Date (most complete previous 3 days)  16Dec2024 (dd/mmm/yyyy)  15Dec2024 (dd/mmm/yyyy)  14Dec2024 (dd/mmm/yyyy)    Breakfast 2-Hour Postprandial Glucose    Glucose Value   63 126 48    PPG TIme  1038  (24 hour-clock)  1227   (24 hour-clock)  1102  (24 hour-clock)    Lunch 2-Hour Postprandial Glucose Glucose Value   193 N/A no lunch N/A no lunch    PPG Time     1423   (24 hour-clock)  N/A   (24 hour-clock)  N/A  (24  hour-clock)    Dinner 2-Hour Postprandial Glucose       Glucose Value  196 55 264    PPG Time     2122  (24 hour-clock)  1907  (24 hour-clock)  1832   (24 hour-clock)           Subject CARB:INSULIN RATIOS used from previous week:      Breakfast Lunch Dinner   Median 2-hour PPG (previous week) 237 168 303   Median 2-hour PPG from week leading into Visit 63 193 ---   Carb:Insulin Ratios used from Previous Visit 6 7 8    New Carb:Insulin Ratios 6 x 1.6 = 9.6 => 8 6 8          If Investigator chose to deviate from protocol recommendation, please indicate why:    Subject had a documented Level 2 Hypoglycemia (<54 mg/dL) in the last 7 days          [x]    Subject frequency of Level 1 Hypoglycemia (70mg /dL) has increased in the last 7 days        [x]    Subject Unwilling                     []    Investigator Discretion           []  Explain:     Patient having lows post meals - particularly BF - she also requested keeping the ICRs as consistent and simplas as poosible for ease of implementation     BOLUS INSULIN ADJUSTMENT PROTOCOL:    GOAL IS 2-HOUR POST-PRANDIAL BLOOD SUGAR < 140mg /dL. Titration detail is provided below:      The following Table will be used to determine any needed change in Carb to Insulin ratio.   In the table, the value of the ratio currently in use is given as ???Xcurrent???, and the adjusted value (determined based on 2-   hour postprandial glucose values, assessed by inspection of CGM profiles) is given as ???Xnew???.       NOTE: the decision to change the Carb to Insulin ratio should be based on three or more days??? worth of data for each meal in question, using the MEDIAN 2-hour post prandial glucose (not the MEAN).      Bolus Insulin Dosing Algorithm:   Algorithm for Adjusting Bolus Insulin     2-hour Postprandial Glucose (MEDIAN over 3 days)  1 Unit Insulin per   X gram Carbs    <100 mg/dL  Xnew = 1.6 of Xcurrent    100-140 mg/dL  Xnew = Xcurrent    161-096 mg/dL  Xnew = 0.8 of Xcurrent    160-200 mg/dL  Xnew = 0.7 of Xcurrent    >200 mg/dL  Xnew = 0.5 of Xcurrent           I have reviewed the information collected in the source data sheet as it applies to the Subject???s dosing titrations. The titrations have been reviewed with the subject, and I recommend the above dosing titration to be made.         - Dr. Bronson Curb     This progress note may be timestamped after permission was given to the coordinator to proceed. Eligibility was determined by me, the investigator, prior to moving forward.

## 2023-10-20 NOTE — Unmapped (Signed)
[place] [place] [facility] Appointment Note  Study:  OPTI-2  Description: A Phase 2b Randomized, Double-blind Trial Comparing HDVInsulin Lispro Versus Insulin Lispro Alone in Adults with Type 1 Diabetes Receiving Insulin Degludec   Protocol:  DP 11-2021-01   Site Number: 05  [facility] IRB#: [id number]  PI: Noelle Penner, MD, PhD  Visit: Dose Optimization Visits 10  Source Version:1      Patient: Sandra Underwood  Date: 17Dec2024  DOB: [birth year]    Checklist:    X AE Review   X Concomitant Medications Review   N/A Vital Signs for in-person visits (V4,V8)   N/A Pregnancy Test  for in-person visits (V4,V8)   N/A Supply dispensing as needed (V4,V8)   X Hypoglycemic Symptoms Log Review (see provider note)   X CGM Data Sufficiency Review (see provider note)   X Insulin Dose and Meal Timing Review (see provider note)   X Clarity Reviews (fasting glucose, 2-hour post prandial blood glucose, meal dose, compliance) (see provider note)   X Insulin updates (see provider note)       Adverse Events  Has the subject experienced any Adverse Events since signing the Informed Consent?   []   Yes (If 'Yes', please complete an Adverse Events Form)  [x]   No    Has the subject taken any new medications or changed any medication usage since the last visit? No.(If ???Yes???, please complete a Concomitant Medications Form)   Medication Name  (Generic / Trade Name) Dose and Frequency Start Date  (dd/mmm/yyyy) Stop Date  (dd/mmm/yyyy) Class of Medication  Primary Indication             Hypoglycemic Symptoms Log     *Note that paper hypoglycemic symptoms log should be collected at in -person visits and data entered by site staff at that time. Please remind subject to bring the paper hypoglycemic symptoms log to the next in-person visit.      Was the Hypoglycemic Symptoms Log reviewed?   No. Reason: not in person.   Were any Hypoglycemic Symptoms experienced?   Yes. Reason: see provider note.     CGM DATA SUFFICIENCY REVIEW    The ???Days with CGM Data??? value is located in the Clarity Software on the Subject???s Data page, on the right side of the screen. Please enter the % value noted for ???Days with CGM Data??? for the Date Range selected. Review the Clarity Software for date ranges with a value of >= 80%.       Days with CGM Data  100 %    Reminder:  There are TWO PARTS to these visits where insulin doses are titrated:    Titration Part 1 is recording data out of Clarity, reviewing it, and following both basal and bolus algorithms for titration.  This should be completed prior to the titration discussion with the subject.        Titration Part 2 is the visit with the patient, during which the titration recommendations are reviewed and implemented.        Note: These titration visits will only be completed during the Dose Optimization Period of the Study (not during the Maintenance Period, unless necessary and requested or indicated by Investigator).        Throughout the entirety of the study, including during run-in, Subjects should be recording the following Event information in real time into the G7 Phone App:   All Insulin doses (basal and bolus)   All MAIN Meal times, including carbohydrate value  of meal. This is typically Breakfast, Lunch, and Dinner, all meals >15g of carbohydrate but should include at least 2 Main Meals per day   Additionally, all ???feeling low??? events must be recorded in the Dexcom G7 App and recorded in the hypoglycemic symptoms log.    Please note that subject payment will be tied to completeness of data, and please also note that if a Subject tries to back-enter/back-fill information at a later date, the date/time stamps will not be correct in the system (they are not editable), and the data will not be usable.      Please remind the Subject not to change their carb to insulin ratios or basal insulin doses unless instructed to do so by the Investigator.        Martin Majestic, CMA

## 2023-10-21 MED FILL — TACROLIMUS 1 MG CAPSULE, IMMEDIATE-RELEASE: ORAL | 30 days supply | Qty: 60 | Fill #0

## 2023-10-21 MED FILL — CETIRIZINE 10 MG TABLET: ORAL | 30 days supply | Qty: 120 | Fill #2

## 2023-10-25 NOTE — Unmapped (Incomplete)
duplicate Note: Refer to Clarity Guide for Information on Collecting Fasting and Post-Meal Glucose Values.  All Clarity-recorded glucose values used for titration should be screenshotted, initialed, and filed in the subject binder.      BASAL DOSING REVIEW AND TITRATION    Subject Basal Dose from Previous Week    This value was recommended by the PI in the previous visit.  Please check the Clarity Software to verify that basal dose is consistent and enter the Subject???s basal dose for last week below. Please note that the subject should not be changing their basal doses day-to-day, so the dose should be the same for all recorded days.    Subject Basal Dose from Previous Week (Week leading into visit)  30U        Record Basal Insulin Doses Leading into Visit in Table Below:   BASAL INSULIN DOSES (Week leading into visit)        Median Basal Dose for 3 days Leading Into Visit      Day 1  Day 2  Day 3      Date  22Dec2024     (dd/mmm/yyyy)  21Dec2024   (dd/mmm/yyyy)  20Dec2024   (dd/mmm/yyyy)  N/A    Basal Dose   Time of Day     2059  (24 hour-clock)  2350  (24 hour-clock)  2203  (24 hour-clock)  N/A     Value (Units)  30 30 30 30         Identify the Subject???s Median Fasting Blood Glucose Leading into Titration Visit:    The Median Fasting Blood Glucose Value is the middle number in a set of three values.?To find these values, review the Subject???s daily glucose plots in provided software and find the lowest fasting (pre-breakfast) glucose value for the three days within the week leading up to the visit/call day. Please refer to the software training documentation provided for further detail on where exactly to find these values.    Fasting glucose is defined as the lowest CGM value observed between 2:00 AM and start of the breakfast meal or first meal, with at least 8 hours of fasting.  Enter the three values below and sort in order of lowest to highest to determine the Median Fasting Blood Glucose Value prior to making basal dosing recommendation changes.    Fasting Blood Glucose Value Date mg/dl Time of recorded FBG   1 22Dec2024 84 0708   2 21Dec2024 76 0808   3 20Dec2024 84 0703   Please identify the Median of these Values by ordering them from low to high and selecting the middle value (see example below):    Median Fasting Blood Glucose Value   84 mg/dl      Use the median Fasting Blood Glucose from above and the algorithm below to determine the subject???s new basal dose, with the goal of achieving a fasting blood glucose between 80mg /dL and 562 mg/dL.     Algorithm for adjusting basal insulin Degludec    3-Day median fasting    glucose level  Adjustment    to insulin    Degludec  Amount    <80 mg/dL or had an unexplained episode of Level 2 fasting or pre-meal hypoglycemia (i.e., <54 mg/dL) occurs   Reduce by  2 U    80-110 mg/dL  No Change      130-865 mg/dL  Increase by  2 U    784-696 mg/dL  Increase by  4 U  181-240 mg/dL  Increase by  6 U    >295 mg/dL  Increase by  8 U      Degludec Protocol Dosing Recommendation (mark one)   (Please review dosing algorithm table above)    Decreased by 2 units []      No Change               []    Increased by 2 units []    Increased by 4 units []    Increased by 6 units []    Increased by 8 units []     New Basal Dose for Subject:                                      *** U      If Investigator chose to deviate from protocol recommendation, please indicate why:       Subject had a documented Level 2 Hypoglycemia (<54 mg/dL) in the last 7 days    []      Subject frequency of Level 1 Hypoglycemia (70mg /dL) has increased in the last 7 days    []    Subject Unwilling             []     Investigator Discretion           [] Explain: ***      Record Bolus Insulin Doses and Mealtime Carbohydrate Values Leading into Visit in Table Below:     Day 1  Day 2  Day 3  Did Subject Correctly Calculate and Follow Carb to Insulin Dosing Guidance? (comment for each meal)     Date  21Dec2024(not enough data on 22nd)  (dd/mmm/yyyy)  19Dec2024 (not enough data on 20th)  (dd/mmm/yyyy)  18Dec2024   (dd/mmm/yyyy)  N/A    Breakfast Bolus Dose   (Carb to Insulin Ratio from previous week)   _________  Time of Day of Breakfast Bolus 0918  (24 hour-clock)  0734  (24 hour-clock)  0808  (24 hour-clock)   ***    Bolus Value (Units)  7 10 11      Meal Time  0918  (24 hour-clock)  0734  (24 hour-clock)  0807  (24 hour-clock)      Meal Carb Value (g) 56 78 88     Current Carb:Insulin Ratio 8 7.8 8    Lunch Bolus Dose    (Carb to Insulin Ratio from previous week)   _________  Time of Day of Lunch Bolus N/A no lunch  (24 hour-clock)  N/A no lunch  (24 hour-clock)  N/A no lunch  (24 hour-clock)  ***     Bolus Value (Units)  0 0 0     Meal Time  N/A  (24 hour-clock)  N/A  (24 hour-clock)  N/A  (24 hour-clock)      Meal Carb Value (g) 0 0 0      Current Carb:Insulin Ratio  0  0  0    Dinner Bolus Dose    (Carb to Insulin Ratio from previous week)   _________  Time of Day of Dinner Bolus 1851  (24 hour-clock)  1746   (24 hour-clock)  1904  (24 hour-clock)  ***     Bolus Value (Units)  10 12 10      Meal Time  1851  (24 hour-clock)  1746  (24 hour-clock)  1904  (24 hour-clock)      Meal Carb Value (g) 80  94 78     Current Carb: Insulin Ratio   8  7.8  7.8       Identify the Subject???s 2-Hour Postprandial Glucose Values Leading into Titration Visit:      Note: To find the Median 2-hour Postprandial  Glucose (PPG) for a Meal, you must find the glucose value that occurs 2 hours after the mealtime stamp entered by the subject.        CARB:INSULIN RATIO BOLUS CALCULATIONS            Day 1  Day 2  Day 3    Date (most complete previous 3 days)  21Dec2024 (dd/mmm/yyyy)  19Dec2024  (dd/mmm/yyyy)  18Dec2024 (dd/mmm/yyyy)    Breakfast 2-Hour Postprandial Glucose    Glucose Value   158 118 134    PPG TIme  1118  (24 hour-clock)  0933   (24 hour-clock)  1008  (24 hour-clock)    Lunch 2-Hour Postprandial Glucose       Glucose Value   N/A no lunch N/A N/A    PPG Time     N/A   (24 hour-clock)  N/A   (24 hour-clock)  N/A  (24 hour-clock)    Dinner 2-Hour Postprandial Glucose       Glucose Value  192 238 205    PPG Time     2053  (24 hour-clock)  1948  (24 hour-clock)  2103   (24 hour-clock)           Subject CARB:INSULIN RATIOS used from previous week:      Breakfast Lunch Dinner   Median 2-hour PPG (previous week) 63 193 196   Median 2-hour PPG from week leading into Visit 134 N/A 205   Carb:Insulin Ratios used from Previous Visit 8 6 8    New Carb:Insulin Ratios *** *** ***      If Investigator chose to deviate from protocol recommendation, please indicate why:    Subject had a documented Level 2 Hypoglycemia (<54 mg/dL) in the last 7 days          []    Subject frequency of Level 1 Hypoglycemia (70mg /dL) has increased in the last 7 days        []    Subject Unwilling                     []    Investigator Discretion           []  Explain: ***     BOLUS INSULIN ADJUSTMENT PROTOCOL:    GOAL IS 2-HOUR POST-PRANDIAL BLOOD SUGAR < 140mg /dL. Titration detail is provided below:      The following Table will be used to determine any needed change in Carb to Insulin ratio.   In the table, the value of the ratio currently in use is given as ???Xcurrent???, and the adjusted value (determined based on 2-   hour postprandial glucose values, assessed by inspection of CGM profiles) is given as ???Xnew???.       NOTE: the decision to change the Carb to Insulin ratio should be based on three or more days??? worth of data for each meal in question, using the MEDIAN 2-hour post prandial glucose (not the MEAN).      Bolus Insulin Dosing Algorithm:   Algorithm for Adjusting Bolus Insulin     2-hour Postprandial Glucose (MEDIAN over 3 days)  1 Unit Insulin per   X gram Carbs    <100 mg/dL  Xnew = 1.6 of Xcurrent  100-140 mg/dL  Xnew = Xcurrent    301-601 mg/dL  Xnew = 0.8 of Xcurrent    160-200 mg/dL  Xnew = 0.7 of Xcurrent    >200 mg/dL  Xnew = 0.5 of Xcurrent           I have reviewed the information collected in the source data sheet as it applies to the Subject???s dosing titrations. The titrations have been reviewed with the subject, and I recommend the above dosing titration to be made.         ***    This progress note may be timestamped after permission was given to the coordinator to proceed. Eligibility was determined by me, the investigator, prior to moving forward.

## 2023-10-25 NOTE — Unmapped (Incomplete)
[facility] Appointment Note  Study:  OPTI-2  Description: A Phase 2b Randomized, Double-blind Trial Comparing HDVInsulin Lispro Versus Insulin Lispro Alone in Adults with Type 1 Diabetes Receiving Insulin Degludec   Protocol:  DP 11-2021-01   Site Number: 05  [facility] IRB#: [id number]  PI: Noelle Penner, MD, PhD  Visit: Dose Optimization Visits 11  Source Version:1      Patient: Sandra Underwood  Date: 23Dec2024  DOB: Feb 11, 2001    Checklist:    X AE Review   X Concomitant Medications Review   N/A Vital Signs for in-person visits (V4,V8)   N/A Pregnancy Test  for in-person visits (V4,V8)   N/A Supply dispensing as needed (V4,V8)   X Hypoglycemic Symptoms Log Review (see provider note)   X CGM Data Sufficiency Review (see provider note)   X Insulin Dose and Meal Timing Review (see provider note)   X Clarity Reviews (fasting glucose, 2-hour post prandial blood glucose, meal dose, compliance) (see provider note)   X Insulin updates (see provider note)       Adverse Events  Has the subject experienced any Adverse Events since signing the Informed Consent?   []   Yes (If 'Yes', please complete an Adverse Events Form)  [x]   No    Has the subject taken any new medications or changed any medication usage since the last visit? No.(If ???Yes???, please complete a Concomitant Medications Form)   Medication Name  (Generic / Trade Name) Dose and Frequency Start Date  (dd/mmm/yyyy) Stop Date  (dd/mmm/yyyy) Class of Medication  Primary Indication             Hypoglycemic Symptoms Log     *Note that paper hypoglycemic symptoms log should be collected at in -person visits and data entered by site staff at that time. Please remind subject to bring the paper hypoglycemic symptoms log to the next in-person visit.      Was the Hypoglycemic Symptoms Log reviewed?   No, not in person   Were any Hypoglycemic Symptoms experienced?   Yes. Reason: see provider note.     CGM DATA SUFFICIENCY REVIEW    The ???Days with CGM Data??? value is located in the Clarity Software on the Subject???s Data page, on the right side of the screen. Please enter the % value noted for ???Days with CGM Data??? for the Date Range selected. Review the Clarity Software for date ranges with a value of >= 80%.       Days with CGM Data  93 %    Reminder:  There are TWO PARTS to these visits where insulin doses are titrated:    Titration Part 1 is recording data out of Clarity, reviewing it, and following both basal and bolus algorithms for titration.  This should be completed prior to the titration discussion with the subject.        Titration Part 2 is the visit with the patient, during which the titration recommendations are reviewed and implemented.        Note: These titration visits will only be completed during the Dose Optimization Period of the Study (not during the Maintenance Period, unless necessary and requested or indicated by Investigator).        Throughout the entirety of the study, including during run-in, Subjects should be recording the following Event information in real time into the G7 Phone App:   All Insulin doses (basal and bolus)   All MAIN Meal times, including carbohydrate value of  meal. This is typically Breakfast, Lunch, and Dinner, all meals >15g of carbohydrate but should include at least 2 Main Meals per day   Additionally, all ???feeling low??? events must be recorded in the Dexcom G7 App and recorded in the hypoglycemic symptoms log.    Please note that subject payment will be tied to completeness of data, and please also note that if a Subject tries to back-enter/back-fill information at a later date, the date/time stamps will not be correct in the system (they are not editable), and the data will not be usable.      Please remind the Subject not to change their carb to insulin ratios or basal insulin doses unless instructed to do so by the Investigator.       Martin Majestic, CMA BP.     Time: ***  BP: ***  Pulse(beats/min): ***  Oral temperature: ***  Respiratory rate(breaths/min): ***  Weight ***    Labs(V4,V8)    If WOCBP, was the urine pregnancy test performed? {YES /NO:21308} Time: ***  What was the result? {Pos/Neg:37645}    Insulin Dispensing - Visit    Please ensure to capture dispensing information in the Drug Accountability Log.          Was Basal Insulin dispensed?   {yes; if no, list JIR:67893}   Was Bolus Insulin dispensed?   {yes; if no, list why:54328}   Was HDV-Lispro / sWFI-Lispro prepared as per drug preparation instructions? (0.51mL HDV or sWFI into 10mL vial of Lispro)         {yes; if no, list YBO:17510}     ID Dispensing Date Return Date ML                           Device and Supply Dispensing Details(V4,V8)     Number of Dexcom G7 sensors dispensed to subject (as needed): ***     Sensor Serial Number(s):  ***     Number of insulin syringes dispensed to subject:  ***   Number of pen needles dispensed to subject:  ***   Number of BGM test strips dispensed to subject:  ***   Hypoglycemic Logs ***     Other    ***  Scheduling    Scheduling     Dose Optimization Visit Week 1 (+/-4 days)Phone:  Dose Optimization Visit Week 2 (+/-4 days)Phone:  Dose Optimization Visit Week 3 (+/-4 days)Phone:  Dose Optimization Visit Week 4 (+/-4 days)In-Person:  Dose Optimization Visit Week 5 (+/-4 days)Phone:  Dose Optimization Visit Week 6 (+/-4 days)Phone:  Dose Optimization Visit Week 7 (+/-4 days)Phone:  Dose Optimization Visit Week 8 (+/-4 days)In-Person:  Dose Optimization Visit Week 9 (+/-4 days)Phone:  Dose Optimization Visit Week 10 (+/-4 days)Phone:  Dose Optimization Visit Week 11 (+/-4 days)Phone:  Dose Optimization Visit Week 12 (+/-4 days)In-Person:  Maintenance Period Week 16 (+/-4 days) In-Person:  Maintenance Period Week 20 (+/-4 days) In-Person:  Maintenance Period Week 24 (+/-4 days) In-Person:  Maintenance Period Week 25 (+/-4 days) In-Person:  Transition Period/Follow up Week 27(+/-4 days)Phone:  End of treatment In-person:     Martin Majestic, CMA

## 2023-10-26 DIAGNOSIS — F32A Depression, unspecified depression type: Principal | ICD-10-CM

## 2023-10-26 DIAGNOSIS — F419 Anxiety disorder, unspecified: Principal | ICD-10-CM

## 2023-10-26 MED ORDER — DULOXETINE 60 MG CAPSULE,DELAYED RELEASE
ORAL_CAPSULE | Freq: Every day | ORAL | 0 refills | 90.00 days
Start: 2023-10-26 — End: 2024-10-25

## 2023-10-26 NOTE — Unmapped (Signed)
[facility] Appointment Note  Study:  OPTI-2  Description: A Phase 2b Randomized, Double-blind Trial Comparing HDVInsulin Lispro Versus Insulin Lispro Alone in Adults with Type 1 Diabetes Receiving Insulin Degludec   Protocol:  DP 11-2021-01   Site Number: 05   IRB#: [id number]  PI: Noelle Penner, MD, PhD  Visit: 11  Source Version:1      Patient: Sandra Underwood  Date: 23Dec2024  DOB: May 29, 2001    A large portion of this work will be completed and reviewed by the healthcare provider prior to the visit by using the data reviewed in the Clarity software.        Was the Subject???s data reviewed in the Clarity Software prior to Scheduled Visit Date?  []   Yes Date:  [x]   No     Was the Hypoglycemic Symptoms Log discussed with the subject? *Note that paper hypoglycemic symptoms log should be collected at in -person visits and data entered by site staff at that time. Please remind subject to bring the paper hypoglycemic symptoms log to the next in-person visit.      [x]   Yes   []   No  Were any Hypoglycemic Symptoms experienced?   Yes. Reason: Not finishing meal after administering of insulin .     TITRATION PART 1: INSULIN DOSING AND MEALTIME REVIEW      Recording Event Data from Software into Source Documentation      Reminder: a MAIN MEAL is any meal that is >15g of carbohydrate, and NOT a ???snack??? meal or bolus associated with a snack or snack correction. In the table below, record the basal and bolus insulin doses, meal times, and carbohydrate values for the ???most complete??? 3 days leading into the visit.    Please remind the Subject not to change their carb to insulin ratios or basal insulin doses unless instructed to do so by the Investigator.     During the Visit, confirm with the Subject that the 3 days recorded are a complete record for each day (including insulin doses, times, and insulin types, as well as ???meals???/carb events--quantity of carbohydrates and time).   Note: Site staff to use best judgment as to whether data are complete.        Note: Refer to Clarity Guide for Information on Collecting Fasting and Post-Meal Glucose Values.  All Clarity-recorded glucose values used for titration should be screenshotted, initialed, and filed in the subject binder.      BASAL DOSING REVIEW AND TITRATION    Subject Basal Dose from Previous Week    This value was recommended by the PI in the previous visit.  Please check the Clarity Software to verify that basal dose is consistent and enter the Subject???s basal dose for last week below. Please note that the subject should not be changing their basal doses day-to-day, so the dose should be the same for all recorded days.    Subject Basal Dose from Previous Week (Week leading into visit)  30U        Record Basal Insulin Doses Leading into Visit in Table Below:   BASAL INSULIN DOSES (Week leading into visit)        Median Basal Dose for 3 days Leading Into Visit      Day 1  Day 2  Day 3      Date  22Dec2024     (dd/mmm/yyyy)  21Dec2024   (dd/mmm/yyyy)  20Dec2024   (dd/mmm/yyyy)  N/A    Basal  Dose   Time of Day     2059  (24 hour-clock)  2350  (24 hour-clock)  2203  (24 hour-clock)  N/A     Value (Units)  30 30 30 30         Identify the Subject???s Median Fasting Blood Glucose Leading into Titration Visit:    The Median Fasting Blood Glucose Value is the middle number in a set of three values.?To find these values, review the Subject???s daily glucose plots in provided software and find the lowest fasting (pre-breakfast) glucose value for the three days within the week leading up to the visit/call day. Please refer to the software training documentation provided for further detail on where exactly to find these values.    Fasting glucose is defined as the lowest CGM value observed between 2:00 AM and start of the breakfast meal or first meal, with at least 8 hours of fasting.  Enter the three values below and sort in order of lowest to highest to determine the Median Fasting Blood Glucose Value prior to making basal dosing recommendation changes.    Fasting Blood Glucose Value Date mg/dl Time of recorded FBG   1 22Dec2024 84 0708   2 21Dec2024 76 0808   3 20Dec2024 84 0703   Please identify the Median of these Values by ordering them from low to high and selecting the middle value (see example below):    Median Fasting Blood Glucose Value   84 mg/dl      Use the median Fasting Blood Glucose from above and the algorithm below to determine the subject???s new basal dose, with the goal of achieving a fasting blood glucose between 80mg /dL and 161 mg/dL.     Algorithm for adjusting basal insulin Degludec    3-Day median fasting    glucose level  Adjustment    to insulin    Degludec  Amount    <80 mg/dL or had an unexplained episode of Level 2 fasting or pre-meal hypoglycemia (i.e., <54 mg/dL) occurs   Reduce by  2 U    80-110 mg/dL  No Change      096-045 mg/dL  Increase by  2 U    409-811 mg/dL  Increase by  4 U    914-782 mg/dL  Increase by  6 U    >956 mg/dL  Increase by  8 U      Degludec Protocol Dosing Recommendation (mark one)   (Please review dosing algorithm table above)    Decreased by 2 units []      No Change               [x]    Increased by 2 units []    Increased by 4 units []    Increased by 6 units []    Increased by 8 units []     New Basal Dose for Subject:                                       U      If Investigator chose to deviate from protocol recommendation, please indicate why:       Subject had a documented Level 2 Hypoglycemia (<54 mg/dL) in the last 7 days    []      Subject frequency of Level 1 Hypoglycemia (70mg /dL) has increased in the last 7 days    []    Subject Unwilling             []   Investigator Discretion           [] Explain:       Record Bolus Insulin Doses and Mealtime Carbohydrate Values Leading into Visit in Table Below:     Day 1  Day 2  Day 3  Did Subject Correctly Calculate and Follow Carb to Insulin Dosing Guidance? (comment for each meal) Date  21Dec2024(not enough data on 22nd)  (dd/mmm/yyyy)  19Dec2024 (not enough data on 20th)  (dd/mmm/yyyy)  18Dec2024   (dd/mmm/yyyy)  N/A    Breakfast Bolus Dose   (Carb to Insulin Ratio from previous week)   _________  Time of Day of Breakfast Bolus 0918  (24 hour-clock)  0734  (24 hour-clock)  0808  (24 hour-clock)   at goal    Bolus Value (Units)  7 10 11      Meal Time  0918  (24 hour-clock)  0734  (24 hour-clock)  0807  (24 hour-clock)      Meal Carb Value (g) 56 78 88     Current Carb:Insulin Ratio 8 7.8 8    Lunch Bolus Dose    (Carb to Insulin Ratio from previous week)   _________  Time of Day of Lunch Bolus N/A no lunch  (24 hour-clock)  N/A no lunch  (24 hour-clock)  N/A no lunch  (24 hour-clock)  No lunch data     Bolus Value (Units)  0 0 0     Meal Time  N/A  (24 hour-clock)  N/A  (24 hour-clock)  N/A  (24 hour-clock)      Meal Carb Value (g) 0 0 0      Current Carb:Insulin Ratio  0  0  0    Dinner Bolus Dose    (Carb to Insulin Ratio from previous week)   _________  Time of Day of Dinner Bolus 1851  (24 hour-clock)  1746   (24 hour-clock)  1904  (24 hour-clock)  At goal     Bolus Value (Units)  10 12 10      Meal Time  1851  (24 hour-clock)  1746  (24 hour-clock)  1904  (24 hour-clock)      Meal Carb Value (g) 80 94 78     Current Carb: Insulin Ratio   8  7.8  7.8       Identify the Subject???s 2-Hour Postprandial Glucose Values Leading into Titration Visit:      Note: To find the Median 2-hour Postprandial  Glucose (PPG) for a Meal, you must find the glucose value that occurs 2 hours after the mealtime stamp entered by the subject.        CARB:INSULIN RATIO BOLUS CALCULATIONS            Day 1  Day 2  Day 3    Date (most complete previous 3 days)  21Dec2024 (dd/mmm/yyyy)  19Dec2024  (dd/mmm/yyyy)  18Dec2024 (dd/mmm/yyyy)    Breakfast 2-Hour Postprandial Glucose    Glucose Value   158 118 134    PPG TIme  1118  (24 hour-clock)  0933   (24 hour-clock)  1008  (24 hour-clock)    Lunch 2-Hour Postprandial Glucose       Glucose Value   N/A no lunch N/A N/A    PPG Time     N/A   (24 hour-clock)  N/A   (24 hour-clock)  N/A  (24 hour-clock)    Dinner 2-Hour Postprandial Glucose       Glucose Value  192 238 205  PPG Time     2053  (24 hour-clock)  1948  (24 hour-clock)  2103   (24 hour-clock)           Subject CARB:INSULIN RATIOS used from previous week:      Breakfast Lunch Dinner   Median 2-hour PPG (previous week) 63 193 196   Median 2-hour PPG from week leading into Visit 134 N/A 205   Carb:Insulin Ratios used from Previous Visit 8 6 8    New Carb:Insulin Ratios 8 5 5       If Investigator chose to deviate from protocol recommendation, please indicate why:    Subject had a documented Level 2 Hypoglycemia (<54 mg/dL) in the last 7 days          []    Subject frequency of Level 1 Hypoglycemia (70mg /dL) has increased in the last 7 days        []    Subject Unwilling                     []    Investigator Discretion           [x]  Explain:     Reduction to 1:4 seems too aggressive, but will reduce to 1:5 given hyperglycemia. [place] was okay with that and knows to call with concerns.      BOLUS INSULIN ADJUSTMENT PROTOCOL:    GOAL IS 2-HOUR POST-PRANDIAL BLOOD SUGAR < 140mg /dL. Titration detail is provided below:      The following Table will be used to determine any needed change in Carb to Insulin ratio.   In the table, the value of the ratio currently in use is given as ???Xcurrent???, and the adjusted value (determined based on 2-   hour postprandial glucose values, assessed by inspection of CGM profiles) is given as ???Xnew???.       NOTE: the decision to change the Carb to Insulin ratio should be based on three or more days??? worth of data for each meal in question, using the MEDIAN 2-hour post prandial glucose (not the MEAN).      Bolus Insulin Dosing Algorithm:   Algorithm for Adjusting Bolus Insulin     2-hour Postprandial Glucose (MEDIAN over 3 days)  1 Unit Insulin per   X gram Carbs    <100 mg/dL  Xnew = 1.6 of Xcurrent 100-140 mg/dL  Xnew = Xcurrent    161-096 mg/dL  Xnew = 0.8 of Xcurrent    160-200 mg/dL  Xnew = 0.7 of Xcurrent    >200 mg/dL  Xnew = 0.5 of Xcurrent           I have reviewed the information collected in the source data sheet as it applies to the Subject???s dosing titrations. The titrations have been reviewed with the subject, and I recommend the above dosing titration to be made.           This progress note may be timestamped after permission was given to the coordinator to proceed. Eligibility was determined by me, the investigator, prior to moving forward.

## 2023-10-26 NOTE — Unmapped (Signed)
Patient is requesting the following refill  Requested Prescriptions     Pending Prescriptions Disp Refills    DULoxetine (CYMBALTA) 60 MG capsule 90 capsule 0     Sig: Take 1 capsule (60 mg total) by mouth daily.       Recent Visits  Date Type Provider Dept   12/24/22 Office Visit Coralee North, Magnus Ivan, FNP [facility] [address] [facility]   Showing recent visits within past 365 days and meeting all other requirements  Future Appointments  No visits were found meeting these conditions.  Showing future appointments within next 365 days and meeting all other requirements       Labs: GAD7:   GAD7 Total Score GAD-7 Total Score   12/24/2022  10:00 AM 2      PHQ9:   PHQ-9 PHQ-9 TOTAL SCORE   12/24/2022  10:00 AM 5

## 2023-10-27 MED ORDER — DULOXETINE 60 MG CAPSULE,DELAYED RELEASE
ORAL_CAPSULE | Freq: Every day | ORAL | 0 refills | 90.00 days | Status: CP
Start: 2023-10-27 — End: 2024-10-26

## 2023-11-05 NOTE — Unmapped (Unsigned)
duplicate Refer to Clarity Guide for Information on Collecting Fasting and Post-Meal Glucose Values.  All Clarity-recorded glucose values used for titration should be screenshotted, initialed, and filed in the subject binder.      BASAL DOSING REVIEW AND TITRATION    Subject Basal Dose from Previous Week    This value was recommended by the PI in the previous visit.  Please check the Clarity Software to verify that basal dose is consistent and enter the Subject???s basal dose for last week below. Please note that the subject should not be changing their basal doses day-to-day, so the dose should be the same for all recorded days.    Subject Basal Dose from Previous Week (Week leading into visit)  30U        Record Basal Insulin Doses Leading into Visit in Table Below:   BASAL INSULIN DOSES (Week leading into visit)        Median Basal Dose for 3 days Leading Into Visit      Day 1  Day 2  Day 3      Date  02Jan2025     (dd/mmm/yyyy)  01Jan2025   (dd/mmm/yyyy)  31Dec2024   (dd/mmm/yyyy)  N/A    Basal Dose   Time of Day     2026  (24 hour-clock)  2120  (24 hour-clock)  2306  (24 hour-clock)  N/A     Value (Units)  30 30 30 30         Identify the Subject???s Median Fasting Blood Glucose Leading into Titration Visit:    The Median Fasting Blood Glucose Value is the middle number in a set of three values.?To find these values, review the Subject???s daily glucose plots in provided software and find the lowest fasting (pre-breakfast) glucose value for the three days within the week leading up to the visit/call day. Please refer to the software training documentation provided for further detail on where exactly to find these values.    Fasting glucose is defined as the lowest CGM value observed between 2:00 AM and start of the breakfast meal or first meal, with at least 8 hours of fasting.  Enter the three values below and sort in order of lowest to highest to determine the Median Fasting Blood Glucose Value prior to making basal dosing recommendation changes.    Fasting Blood Glucose Value Date mg/dl Time of recorded FBG   1 02Jan2025 77 0942   2 01Jan2025 94 0747   3 31Dec2024 130 0757   Please identify the Median of these Values by ordering them from low to high and selecting the middle value (see example below):    Median Fasting Blood Glucose Value   94 mg/dl      Use the median Fasting Blood Glucose from above and the algorithm below to determine the subject???s new basal dose, with the goal of achieving a fasting blood glucose between 80mg /dL and 098 mg/dL.     Algorithm for adjusting basal insulin Degludec    3-Day median fasting    glucose level  Adjustment    to insulin    Degludec  Amount    <80 mg/dL or had an unexplained episode of Level 2 fasting or pre-meal hypoglycemia (i.e., <54 mg/dL) occurs   Reduce by  2 U    80-110 mg/dL  No Change      119-147 mg/dL  Increase by  2 U    829-562 mg/dL  Increase by  4 U    130-865  mg/dL  Increase by  6 U    >829 mg/dL  Increase by  8 U      Degludec Protocol Dosing Recommendation (mark one)   (Please review dosing algorithm table above)    Decreased by 2 units []      No Change               []    Increased by 2 units []    Increased by 4 units []    Increased by 6 units []    Increased by 8 units []     New Basal Dose for Subject:                                       U      If Investigator chose to deviate from protocol recommendation, please indicate why:       Subject had a documented Level 2 Hypoglycemia (<54 mg/dL) in the last 7 days    []      Subject frequency of Level 1 Hypoglycemia (70mg /dL) has increased in the last 7 days    []    Subject Unwilling             []     Investigator Discretion           [] Explain:       Record Bolus Insulin Doses and Mealtime Carbohydrate Values Leading into Visit in Table Below:   Note: If the Carb to Insulin ratio results in a decimal or fraction, you may round up to the nearest whole number. You may also use the fractional dose if preferred by the PI or subject     Day 1  Day 2  Day 3  Did Subject Correctly Calculate and Follow Carb to Insulin Dosing Guidance? (comment for each meal)     Date  02Jan2025  (dd/mmm/yyyy)  01Jan2025  (dd/mmm/yyyy)  31Dec2024   (dd/mmm/yyyy)  N/A    Breakfast Bolus Dose   (Carb to Insulin Ratio from previous week)   _________  Time of Day of Breakfast Bolus N/A no breakfast  (24 hour-clock)  0951  (24 hour-clock)  0947  (24 hour-clock)       Bolus Value (Units)  0 8 17     Meal Time  N/A no breakfast  (24 hour-clock)  0951  (24 hour-clock)  0947  (24 hour-clock)      Meal Carb Value (g) 0 46 84     Current Carb:Insulin Ratio 0 5.8 4.9    Lunch Bolus Dose    (Carb to Insulin Ratio from previous week)   _________  Time of Day of Lunch Bolus 1225  (24 hour-clock)  N/A no lunch  (24 hour-clock)  1351  (24 hour-clock)       Bolus Value (Units)  18 0 16     Meal Time  1225  (24 hour-clock)  N/A no lunch  (24 hour-clock)  1351  (24 hour-clock)      Meal Carb Value (g) 86 0 79      Current Carb:Insulin Ratio  4.8  0  4.9    Dinner Bolus Dose    (Carb to Insulin Ratio from previous week)   _________  Time of Day of Dinner Bolus 1842  (24 hour-clock)  1620   (24 hour-clock)  1909  (24 hour-clock)       Bolus Value (Units)  12 10 10  Meal Time  1842  (24 hour-clock)  1548  (24 hour-clock)  1909  (24 hour-clock)      Meal Carb Value (g) 98 78 73     Current Carb: Insulin Ratio   8.2  7.8  7.3       Identify the Subject???s 2-Hour Postprandial Glucose Values Leading into Titration Visit:      Note: To find the Median 2-hour Postprandial  Glucose (PPG) for a Meal, you must find the glucose value that occurs 2 hours after the mealtime stamp entered by the subject.        CARB:INSULIN RATIO BOLUS CALCULATIONS            Day 1  Day 2  Day 3    Date (most complete previous 3 days)  02Jan2025 (dd/mmm/yyyy)  01Jan2025  (dd/mmm/yyyy)  31Dec2024 (dd/mmm/yyyy)    Breakfast 2-Hour Postprandial Glucose    Glucose Value   N/A no breakfast 124 109    PPG TIme N/A no breakfast  (24 hour-clock)  1152   (24 hour-clock)  1147  (24 hour-clock)    Lunch 2-Hour Postprandial Glucose       Glucose Value   150 N/A no lunch 137    PPG Time     1427   (24 hour-clock)  N/A no lunch  (24 hour-clock)  1552  (24 hour-clock)    Dinner 2-Hour Postprandial Glucose       Glucose Value  206 55 153    PPG Time     2042  (24 hour-clock)  1747  (24 hour-clock)  2107   (24 hour-clock)           Subject CARB:INSULIN RATIOS used from previous week:      Breakfast Lunch Dinner   Median 2-hour PPG (previous week) 134 N/A 205   Median 2-hour PPG from week leading into Visit 117 144 153   Carb:Insulin Ratios used from Previous Visit 8 5 5    New Carb:Insulin Ratios            If Investigator chose to deviate from protocol recommendation, please indicate why:    Subject had a documented Level 2 Hypoglycemia (<54 mg/dL) in the last 7 days          []    Subject frequency of Level 1 Hypoglycemia (70mg /dL) has increased in the last 7 days        []    Subject Unwilling                     []    Investigator Discretion           []  Explain:      BOLUS INSULIN ADJUSTMENT PROTOCOL:    GOAL IS 2-HOUR POST-PRANDIAL BLOOD SUGAR < 140mg /dL. Titration detail is provided below:      The following Table will be used to determine any needed change in Carb to Insulin ratio.   In the table, the value of the ratio currently in use is given as ???Xcurrent???, and the adjusted value (determined based on 2-   hour postprandial glucose values, assessed by inspection of CGM profiles) is given as ???Xnew???.       NOTE: the decision to change the Carb to Insulin ratio should be based on three or more days??? worth of data for each meal in question, using the MEDIAN 2-hour post prandial glucose (not the MEAN).      Bolus Insulin Dosing Algorithm:   Algorithm for Adjusting Bolus  Insulin     2-hour Postprandial Glucose (MEDIAN over 3 days)  1 Unit Insulin per   X gram Carbs    <100 mg/dL  Xnew = 1.6 of Xcurrent    100-140 mg/dL  Xnew = Xcurrent    034-742 mg/dL  Xnew = 0.8 of Xcurrent    160-200 mg/dL  Xnew = 0.7 of Xcurrent    >200 mg/dL  Xnew = 0.5 of Xcurrent

## 2023-11-06 ENCOUNTER — Ambulatory Visit: Admit: 2023-11-06 | Discharge: 2023-11-07

## 2023-11-06 DIAGNOSIS — Z006 Encounter for examination for normal comparison and control in clinical research program: Principal | ICD-10-CM

## 2023-11-06 MED ORDER — STUDY DP 01-2023-01 HDV-LISPRO U-100 / PLACEBO INJECTION SOLUTION
0 refills | 0.00 days | Status: CP
Start: 2023-11-06 — End: ?

## 2023-11-06 MED ORDER — STUDY DP 01-2023-01 INSULIN DEGLUDEC 100 UNITS/ML INJECTION PEN
PEN_INJECTOR | 0 refills | 0.00 days | Status: CP
Start: 2023-11-06 — End: ?

## 2023-11-06 NOTE — Unmapped (Signed)
[facility] - T1DM Return Visit    Assessment and Plan:  Sandra Underwood is a 23 y.o. female with PMHx of T1DM and chronic urticaria on tacrolimus who is being seen in follow-up for T1DM.    1. Type 1 diabetes without known complication, currently participating in clinical trial OPTI-2  A1c: HbA1c 6.6%, previously 7.3% (12/2022). I think A1c is underestimate of true glycemic control given TIR ~50% and GMI in high 7s. Once she completes OPTI-2 trial participation, we will begin working toward goal HbA1c <6.5% without hypoglycemia given interest in conception in the future.  Brief DM History: Diagnosed with T1DM at age 46. She has been on and off of pump therapy recently and participated in a couple of clinical trials, including INHALE and now OPTI-2.  Current Regimen: Tresiba 30 units daily, Humalog ICR 1:5/5/8, 1:50>150  CGM Review: CGM downloaded and interpreted x 14 days. Daily trends reviewed including patterns. Patterns include: MBG 184, SD 69, 49% TIR, 3% low, 48% high, 97% time active. Typically waking up in range, largest excursions are in the late evening/early morning hours.  Today: Since last visit, Zoà has been randomized in OPTI-2. The OPTI-2 trial team has been managing her insulin doses while she is in the trial, therefore we will pick up once her participation in the trial concludes, anticipated in April. She got engaged shortly after her last visit, therefore with long-term family planning in mind, once her participation in the trial concludes, we will likely discuss resuming pump therapy, which she has been thinking about as well.    *The patient is currently using the therapeutic CGM as prescribed.  *The patient has diabetes mellitus.  *The patient is insulin-treated with 2 or more daily injections of insulin(MDII).   *The patient has been using a home blood glucose monitor (BGM) and has performed tests 4 times per day or more prior to starting use of a CGM.   *The patient???s insulin treatment regimen requires frequent adjustments by the patient on the basis of BGM and/or therapeutic CGM testing results.   *Within the last six months, I had an in-person visit with the beneficiary to evaluate their diabetes control and determine that the above criteria are met.   *Every six months I will continue to have an in-person visit with the beneficiary to assess adherence to their CGM regimen and diabetes treatment plan.    2. Diabetes-related complications and prevention  - Hypoglycemia: Endorses hypoglycemic awareness around high 50s-low 60s. She has unexpired glucagon.  - Eyes: Last eye exam 08/2022 with [facility]. No retinopathy. Due 08/2023. Encouraged her to schedule.  - Kidneys: Last urine MA/C negative (02/2022) and GFR >90 (06/2022). Due 02/2023. I believe she has these checked at OPTI-2 screening, will check in with study coordinator.  - Feet: No symptoms. No concerns on exam. Due 07/2023.  - BP: WNL  - Lipids/ASCVD risk: Last lipids with LDL 69, HDL 54, TChol 147, TG 118 (02/2021).  - TSH: 1.761 in 12/2022. She has been on levothyroxine for years, she does not recall why this was started. After her participation in the trial concludes, we may trial discontinuation of levothyroxine and see if she really needs this. Due 12/2023.  - Contraception: OCPs     Return in about 4 months (around 03/08/2024).    Patient was discussed with Dr. Dell Ponto.    Rosiland Oz, MD, PhD  PGY-5, Division of Endocrinology  Phone: [phone]  Fax: [phone]    Subjective:  Sandra Halt  Wells Underwood is a 23 y.o. female with PMHx of T1DM and chronic urticaria on tacrolimus who is being seen in follow-up for T1DM.    Initial history obtained by Patric Dykes on 03/01/2018: Ms. Cangiano is a senior in [employer] taking college courses at her local community college in anticipation of a attending [employer] in the Fall of 2019 for business and Social research officer, government. She is interested in becoming a Best boy.     The patient was initially diagnosed with Type 1 diabetes mellitus in November 2014.   History of hospitalizations for DKA: yes x3 in 2018, 2/2 UTI x2 and food poisoning x1.  FH of autoimmune dysfunction: yes - Mom has ulcerative colitis.  Hx of auto-immune co-morbidities: No.      Current symptoms/problems: none  Known diabetic complications: none  Current diabetic medications: Lantus pen 32u @ HS without misses doses, Novolog 1u:8g carbs and 1u:50points >150. Taking Novolog 1-2x/day.      Previous medication trials: metformin for 1 day at diagnosis.     Eye exam current (within one year): yes - Dr. Dede Query, [facility] in 07/2017. No hx of DR. No vision change. 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      Menstrual cycle: Menarche@ 23yo, OBCP @23yo , taking Junel 21d w/o placebo week per GYN. Has menses once every 6-9 months.      Current monitoring regimen: Freestyle Libre with receiver on her phone  Blood glucose meter to visit: phone  Download: Not able to download from phone at this time. Phone reviewed manually. Checking 1-2x/day. FBG 111-160mg /dl.      Any episodes of severe hypoglycemia (requiring the assistance of others)? no  Hypoglycemic awareness: 70mg /dl     ACE inhibitor or angiotensin II receptor blocker? no  Statin? no  ASA? no  Weight trend: stable  Current diet: high carb 30g -B, 90g - L, 150g - D.   Exercise: no. Barriers to exercise: none    TODAY (11/09/23): Getting married in [place], getting legally married on December 1, wedding the following March 2026. Would like to move to [place] lon-gterm. OPTI-2 trial ongoing, anticipates completion in April. Running higher in past few weeks due to cold. She was started on levothyroxine 50 mcg shortly after diagnosis of type 1 daibetes.    Past Medical History:   Diagnosis Date    Allergic conjunctivitis     Allergic rhinitis     Anxiety     Arthritis     ankles and elbows    Depression     Disease of thyroid gland     DKA (diabetic ketoacidoses)     DM (diabetes mellitus), type 1 (CMS-HCC)     Food allergy     lemon    Type 1 Duane's syndrome      Past Surgical History:   Procedure Laterality Date    TONSILLECTOMY AND ADENOIDECTOMY  10/2015     Family History   Problem Relation Age of Onset    Ulcerative colitis Mother     Asthma Maternal Grandfather     Hypertension Maternal Grandfather     Cancer Maternal Grandmother         Lung Cancer    Diabetes Paternal Grandmother         Type 2    Cataracts Neg Hx     Glaucoma Neg Hx      Social History     Socioeconomic History    Marital status:  Single    Highest education level: Associate degree: occupational, technical, or vocational program   Tobacco Use    Smoking status: Never     Passive exposure: Never    Smokeless tobacco: Never    Tobacco comments:     once every 2 months   Vaping Use    Vaping status: Never Used   Substance and Sexual Activity    Alcohol use: Not Currently     Alcohol/week: 2.0 standard drinks of alcohol     Comment: hard liquor once a month- social    Drug use: Never     Comment: previous use of MDMA    Sexual activity: Yes     Partners: Male     Birth control/protection: Other     Comment: pill     Social Drivers of Psychologist, prison and probation services Strain: Low Risk  (12/24/2022)    Overall Financial Resource Strain (CARDIA)     Difficulty of Paying Living Expenses: Not hard at all   Food Insecurity: No Food Insecurity (12/24/2022)    Hunger Vital Sign     Worried About Running Out of Food in the Last Year: Never true     Ran Out of Food in the Last Year: Never true   Transportation Needs: No Transportation Needs (12/24/2022)    PRAPARE - Transportation     Lack of Transportation (Medical): No     Lack of Transportation (Non-Medical): No   Stress: Stress Concern Present (09/15/2022)    [employer] of Occupational Health - Occupational Stress Questionnaire     Feeling of Stress : Very much   Social Connections: Moderately Integrated (09/15/2022)    Social Connection and Isolation Panel [NHANES]     Frequency of Communication with Friends and Family: More than three times a week     Frequency of Social Gatherings with Friends and Family: Twice a week     Attends Religious Services: More than 4 times per year     Active Member of [employer] or Organizations: Yes     Attends Banker Meetings: Never     Marital Status: Never married     Allergies   Allergen Reactions    Amoxicillin-Pot Clavulanate Rash    Grass Pollen Hives    Ibuprofen Swelling    Lemon Hives    Malt Extract Hives    Metronidazole Hives    Peanut Hives    Tree And Shrub Pollen Hives    Amoxicillin Rash    Penicillins Rash     Current Outpatient Medications   Medication Instructions    acetaminophen (TYLENOL) 500 MG tablet Oral    blood sugar diagnostic Strp Use to check blood sugar four (4) times a day (before meals and nightly).    blood-glucose sensor (DEXCOM G7 SENSOR) Devi Use to monitor blood glucose levels continuously. Change sensor every 10 days.    cetirizine (ZYRTEC) 10 MG tablet Take 1-2 tablets (10-20 mg total) by mouth two (2) times a day for hives.    DULoxetine (CYMBALTA) 60 mg, Oral, Daily (standard)    HumaLOG KwikPen Insulin 0-50 Units, Subcutaneous, Daily (standard), Needs an appointment for further refills. [phone]    insulin degludec (TRESIBA FLEXTOUCH U-100) 100 unit/mL (3 mL) InPn Inject 0.5 mL (50 Units total) under the skin daily as per MD instructions..    insulin lispro (HUMALOG U-100 INSULIN) 100 unit/mL injection Use up to 100 units/day, divided into 3 times daily before meals as per  MD instructions    insulin pump cart,auto,BT-cntr (OMNIPOD 5 G6 INTRO KIT, GEN 5,) Crtg Started kit includes controller and 11 pods.  Change pods every 3 days or as instructed by your health care professional.    insulin pump cart,automated,BT (OMNIPOD 5 G6 PODS, GEN 5,) Crtg Pods to be changed every 3 days or as instructed by your health care professional.    lancets (ONETOUCH DELICA LANCETS) 33 gauge Misc Use to check bloof sugar 6 times daily    levothyroxine (SYNTHROID) 25 mcg, Oral, Daily (standard)    neomycin-polymyxin-hydrocortisone (CORTISPORIN) 3.5-10,000-1 mg/mL-unit/mL-% otic suspension ADMINISTER 4 DROPS INTO THE LEFT EAR 4 (FOUR) TIMES A DAY FOR 7 DAYS FOR 7-10 DAYS.    norethindrone-ethinyl estradiol (JUNEL 1/20, 21,) 1-20 mg-mcg per tablet TAKE 1 TABLET BY MOUTH EVERY DAY    pen needle, diabetic (ULTICARE PEN NEEDLE) 32 gauge x 5/32 (4 mm) Ndle Use as directed 4 times daily.    STUDY DP 11-2021-01 hdv-lispro/placebo U-100 injection solution Inject subcutaneously three times daily before meals per investigator's instructions to target a 2-hour post prandial blood sugar of 140mg /dL.  REFRIGERATE    STUDY DP 11-2021-01 hdv-lispro/placebo U-100 injection solution Inject subcutaneously three times daily before meals per investigator's instructions to target a 2-hour post prandial blood sugar of 140mg /dL.  REFRIGERATE    STUDY DP 11-2021-01 hdv-lispro/placebo U-100 injection solution Inject subcutaneously three times daily before meals per investigator's instructions to target a 2-hour post prandial blood sugar of 140mg /dL.  REFRIGERATE    STUDY DP 11-2021-01 hdv-lispro/placebo U-100 injection solution Inject subcutaneously three times daily before meals per investigator's instructions to target a 2-hour post prandial blood sugar of 140mg /dL.  REFRIGERATE    STUDY DP-11-2021-01 insulin degludec U-100 100 unit/mL (3 mL) injection pen Inject 34 units subcutaneously once daily as directed at the same time of day (morning or evening) throughout the study duration   REFRIGERATE    STUDY DP-11-2021-01 insulin degludec U-100 100 unit/mL (3 mL) injection pen Inject subcutaneously once daily as directed at the same time of day (morning or evening). Dose will be titrated by the Investigator according to the protocol in order to achieve a fasting blood sugar CGM glucose level of 80 to 110 mg/dL, without hypoglycemia defined as FBS <54 mg/dL.  REFRIGERATE    STUDY DP-11-2021-01 insulin degludec U-100 100 unit/mL (3 mL) injection pen Inject subcutaneously once daily as directed at the same time of day (morning or evening). Dose will be titrated by the Investigator according to the protocol in order to achieve a fasting blood sugar CGM glucose level of 80 to 110 mg/dL, without hypoglycemia defined as FBS <54 mg/dL.  REFRIGERATE    STUDY DP-11-2021-01 insulin degludec U-100 100 unit/mL (3 mL) injection pen Inject subcutaneously once daily as directed at the same time of day (morning or evening). Dose will be titrated by the Investigator according to the protocol in order to achieve a fasting blood sugar CGM glucose level of 80 to 110 mg/dL, without hypoglycemia defined as FBS <54 mg/dL.  REFRIGERATE    STUDY DP-11-2021-01 insulin degludec U-100 100 unit/mL (3 mL) injection pen Inject subcutaneously once daily as directed at the same time of day (morning or evening). Dose will be titrated by the Investigator according to the protocol in order to achieve a fasting blood sugar CGM glucose level of 80 to 110 mg/dL, without hypoglycemia defined as FBS <54 mg/dL.  REFRIGERATE    STUDY DS-11-2021-01 insulin lispro (HUMALOG) 100 units/mL injection solution Inject  subcutaneously three times daily before meals per investigator's instructions to target a 2-hour post prandial blood sugar of 140mg /dL.  REFRIGERATE    syringe with needle 1 mL 28 gauge x 1/2 Syrg Use every 7 (seven) days.    tacrolimus (PROGRAF) 1 mg, Oral, 2 times a day       Objective:  Vitals:    11/09/23 1115   BP: 117/83   Pulse: 99       Wt Readings from Last 6 Encounters:   11/09/23 85.9 kg (189 lb 6.4 oz)   08/03/23 81.6 kg (180 lb)   12/24/22 75.8 kg (167 lb)   09/22/22 73 kg (161 lb)   08/15/22 70.8 kg (156 lb)   08/08/22 68 kg (150 lb)     Physical Exam  Constitutional:       General: She is not in acute distress.     Appearance: She is not ill-appearing.   Eyes:      Comments: Wearing glasses.   Pulmonary:      Effort: Pulmonary effort is normal. No respiratory distress.   Neurological:      Mental Status: She is alert.   Psychiatric:         Mood and Affect: Mood normal.         Behavior: Behavior normal.       Data Review: I personally reviewed all available, pertinent labs, imaging, and outside records.    Portions of this record may have been created using Scientist, clinical (histocompatibility and immunogenetics). Dictation errors have been sought but may not have all been identified and corrected.

## 2023-11-06 NOTE — Unmapped (Unsigned)
[facility] Appointment Note  Study:  OPTI-2  Description: A Phase 2b Randomized, Double-blind Trial Comparing HDVInsulin Lispro Versus Insulin Lispro Alone in Adults with Type 1 Diabetes Receiving Insulin Degludec   Protocol:  DP 11-2021-01   Site Number: 05  [place] IRB#: [id number]  PI: Noelle Penner, MD, PhD  Visit: Dose Optimization Visit:12  Source Version:1      Patient: Sandra Underwood  Date: 03Jan2025  DOB: [birth year]    Checklist:    X AE Review   X Concomitant Medications Review   X Vital Signs   X Pregnancy Test    X Central Labs (A1c, Chemistry, Hematology)   X Hypoglycemic Symptoms Log Review (see provider note)   X CGM Data Sufficiency Review (see provider note)   X Insulin Dose and Meal Timing Review (see provider note)   X Clarity Reviews (fasting glucose, 2-hour post prandial blood glucose, meal dose, compliance) (see provider note)   X Insulin updates (see provider note)   X Supply dispensing as needed      Adverse Events  Has the subject experienced any Adverse Events since signing the Informed Consent?   [x]   Yes (If 'Yes', please complete an Adverse Events Form)  []   No  URI: 26Dec2024-ongoing  GI bug: 29Dec2024-31Dec2024  Has the subject taken any new medications or changed any medication usage since the last visit? Yes.(If ???Yes???, please complete a Concomitant Medications Form)   Medication Name  (Generic / Trade Name) Dose and Frequency Start Date  (dd/mmm/yyyy) Stop Date  (dd/mmm/yyyy) Class of Medication  Primary Indication   Tylenol cold and flu 2 Capsules BID 26Dec2024 31Dec2024 other AE- URI   Coricidin 2 capsules BID 31Dec2024 02Jan2025 other AE-URI     Hypoglycemic Symptoms Log     *Note that paper hypoglycemic symptoms log should be collected at in -person visits and data entered by site staff at that time. Please remind subject to bring the paper hypoglycemic symptoms log to the next in-person visit.      Was the Hypoglycemic Symptoms Log reviewed?   Yes.   Were any Hypoglycemic Symptoms experienced?   No     CGM DATA SUFFICIENCY REVIEW    The ???Days with CGM Data??? value is located in the Clarity Software on the Subject???s Data page, on the right side of the screen. Please enter the % value noted for ???Days with CGM Data??? for the Date Range selected. Review the Clarity Software for date ranges with a value of >= 80%.       Days with CGM Data  93 %    Reminder:  There are TWO PARTS to these visits where insulin doses are titrated:    Titration Part 1 is recording data out of Clarity, reviewing it, and following both basal and bolus algorithms for titration.  This should be completed prior to the titration discussion with the subject.        Titration Part 2 is the visit with the patient, during which the titration recommendations are reviewed and implemented.        Note: These titration visits will only be completed during the Dose Optimization Period of the Study (not during the Maintenance Period, unless necessary and requested or indicated by Investigator).        Throughout the entirety of the study, including during run-in, Subjects should be recording the following Event information in real time into the G7 Phone App:   All Insulin doses (basal and  bolus)   All MAIN Meal times, including carbohydrate value of meal. This is typically Breakfast, Lunch, and Dinner, all meals >15g of carbohydrate but should include at least 2 Main Meals per day   Additionally, all ???feeling low??? events must be recorded in the Dexcom G7 App and recorded in the hypoglycemic symptoms log.    Please note that subject payment will be tied to completeness of data, and please also note that if a Subject tries to back-enter/back-fill information at a later date, the date/time stamps will not be correct in the system (they are not editable), and the data will not be usable.      Please remind the Subject not to change their carb to insulin ratios or basal insulin doses unless instructed to do so by the Investigator.     Vital signs   Subject must rest 5 minutes before reading BP.     Time: 1055  BP: 105/79  Pulse(beats/min): 102  Oral temperature: 36.8c  Respiratory rate(breaths/min): 18  Weight: 84.8kg    Labs  Were labs (A1C, Hematology, Chemistry) drawn? Yes.Time:1120    If WOCBP, was the urine pregnancy test performed? Yes Time: 1115  What was the result? Negative    Insulin Dispensing - Visit    Please ensure to capture dispensing information in the Drug Accountability Log.        Dispensing Date Return Date   Was Basal Insulin dispensed?   Yes.     Was Bolus Insulin dispensed?   Yes.     Was HDV-Lispro / sWFI-Lispro prepared as per drug preparation instructions? (0.26mL HDV or sWFI into 10mL vial of Lispro)         Yes.         ID/Quantity  Dispensing Date Return Date ML   [id number] 16Sep2024  N/A N/A   [id number] C 16Sep2024 03Oct2024  4.4      [id number] 03Oct2024 N/A  N/A     [id number] 03Oct2024  02Dec2024  10 returned late, unused   [id number] 03Oct2024  28Oct2024  1     [id number] 28Oct2024  02Dec2024  0     V6870  28Oct2024  02Dec2024  4      V7041   02Dec2024  N/A 0 Pt states it was empty and accidentally threw it away    V7401  02Dec2024  03Jan2025  7.2     V6399 03Jan2025      V2526  03Jan2025              Device and Supply Dispensing Details(V4,V8)     Number of Dexcom G7 sensors dispensed to subject (as needed): 4   ,      Sensor Serial Number(s):  [id number]     Number of insulin syringes dispensed to subject:  150   Number of pen needles dispensed to subject:  33   Number of BGM test strips dispensed to subject:  0   Hypoglycemic Logs 2     Other      Scheduling    Maintenance Period Week 16 (+/-4 days) In-Person: 24Jan2025 @1030   Maintenance Period Week 20 (+/-4 days) In-Person: 18Feb2025 @1030   Maintenance Period Week 24 (+/-4 days) In-Person: 18Mar2025 @1030   Maintenance Period Week 25 (+/-4 days) In-Person: 25Mar2025 @1030   Transition Period/Follow up Week 27(+/-4 days)Phone: 11Apr2025    Martin Majestic, CMA

## 2023-11-06 NOTE — Unmapped (Unsigned)
[facility] Appointment Note  Study:  OPTI-2  Description: A Phase 2b Randomized, Double-blind Trial Comparing HDVInsulin Lispro Versus Insulin Lispro Alone in Adults with Type 1 Diabetes Receiving Insulin Degludec   Protocol:  DP 11-2021-01   Site Number: 05  [place] IRB#: [id number]  PI: Noelle Penner, MD, PhD  Visit: 12  Source Version:1    Patient: Sandra Underwood  Date: 03Jan2025  DOB: [birth year]    A large portion of this work will be completed and reviewed by the healthcare provider prior to the visit by using the data reviewed in the Clarity software.        Was the Subject???s data reviewed in the Clarity Software prior to Scheduled Visit Date?  []   Yes Date:  [x]   No     Was the Hypoglycemic Symptoms Log discussed with the subject? *Note that paper hypoglycemic symptoms log should be collected at in -person visits and data entered by site staff at that time. Please remind subject to bring the paper hypoglycemic symptoms log to the next in-person visit.      [x]   Yes   []   No  Were any Hypoglycemic Symptoms experienced?   No.     TITRATION PART 1: INSULIN DOSING AND MEALTIME REVIEW      Recording Event Data from Software into Source Documentation      Reminder: a MAIN MEAL is any meal that is >15g of carbohydrate, and NOT a ???snack??? meal or bolus associated with a snack or snack correction. In the table below, record the basal and bolus insulin doses, meal times, and carbohydrate values for the ???most complete??? 3 days leading into the visit.    Please remind the Subject not to change their carb to insulin ratios or basal insulin doses unless instructed to do so by the Investigator.     During the Visit, confirm with the Subject that the 3 days recorded are a complete record for each day (including insulin doses, times, and insulin types, as well as ???meals???/carb events--quantity of carbohydrates and time).   Note: Site staff to use best judgment as to whether data are complete.        Note: Refer to Clarity Guide for Information on Collecting Fasting and Post-Meal Glucose Values.  All Clarity-recorded glucose values used for titration should be screenshotted, initialed, and filed in the subject binder.      BASAL DOSING REVIEW AND TITRATION    Subject Basal Dose from Previous Week    This value was recommended by the PI in the previous visit.  Please check the Clarity Software to verify that basal dose is consistent and enter the Subject???s basal dose for last week below. Please note that the subject should not be changing their basal doses day-to-day, so the dose should be the same for all recorded days.    Subject Basal Dose from Previous Week (Week leading into visit)  30U        Record Basal Insulin Doses Leading into Visit in Table Below:   BASAL INSULIN DOSES (Week leading into visit)        Median Basal Dose for 3 days Leading Into Visit      Day 1  Day 2  Day 3      Date  02Jan2025     (dd/mmm/yyyy)  01Jan2025   (dd/mmm/yyyy)  31Dec2024   (dd/mmm/yyyy)  N/A    Basal Dose   Time of Day     2026  (  24 hour-clock)  2120  (24 hour-clock)  2306  (24 hour-clock)  N/A     Value (Units)  30 30 30 30         Identify the Subject???s Median Fasting Blood Glucose Leading into Titration Visit:    The Median Fasting Blood Glucose Value is the middle number in a set of three values.?To find these values, review the Subject???s daily glucose plots in provided software and find the lowest fasting (pre-breakfast) glucose value for the three days within the week leading up to the visit/call day. Please refer to the software training documentation provided for further detail on where exactly to find these values.    Fasting glucose is defined as the lowest CGM value observed between 2:00 AM and start of the breakfast meal or first meal, with at least 8 hours of fasting.  Enter the three values below and sort in order of lowest to highest to determine the Median Fasting Blood Glucose Value prior to making basal dosing recommendation changes.    Fasting Blood Glucose Value Date mg/dl Time of recorded FBG   1 02Jan2025 77 0942   2 01Jan2025 94 0747   3 31Dec2024 130 0757   Please identify the Median of these Values by ordering them from low to high and selecting the middle value (see example below):    Median Fasting Blood Glucose Value   94 mg/dl      Use the median Fasting Blood Glucose from above and the algorithm below to determine the subject???s new basal dose, with the goal of achieving a fasting blood glucose between 80mg /dL and 161 mg/dL.     Algorithm for adjusting basal insulin Degludec    3-Day median fasting    glucose level  Adjustment    to insulin    Degludec  Amount    <80 mg/dL or had an unexplained episode of Level 2 fasting or pre-meal hypoglycemia (i.e., <54 mg/dL) occurs   Reduce by  2 U    80-110 mg/dL  No Change      096-045 mg/dL  Increase by  2 U    409-811 mg/dL  Increase by  4 U    914-782 mg/dL  Increase by  6 U    >956 mg/dL  Increase by  8 U      Degludec Protocol Dosing Recommendation (mark one)   (Please review dosing algorithm table above)    Decreased by 2 units []      No Change               [x]    Increased by 2 units []    Increased by 4 units []    Increased by 6 units []    Increased by 8 units []     New Basal Dose for Subject:                                      30 U      If Investigator chose to deviate from protocol recommendation, please indicate why:       Subject had a documented Level 2 Hypoglycemia (<54 mg/dL) in the last 7 days    []      Subject frequency of Level 1 Hypoglycemia (70mg /dL) has increased in the last 7 days    []    Subject Unwilling             []   Investigator Discretion           [] Explain:     Record Bolus Insulin Doses and Mealtime Carbohydrate Values Leading into Visit in Table Below:   Note: If the Carb to Insulin ratio results in a decimal or fraction, you may round up to the nearest whole number. You may also use the fractional dose if preferred by the PI or subject     Day 1  Day 2  Day 3  Did Subject Correctly Calculate and Follow Carb to Insulin Dosing Guidance? (comment for each meal)     Date  02Jan2025  (dd/mmm/yyyy)  01Jan2025  (dd/mmm/yyyy)  31Dec2024   (dd/mmm/yyyy)  N/A    Breakfast Bolus Dose   (Carb to Insulin Ratio from previous week)   _________  Time of Day of Breakfast Bolus N/A no breakfast  (24 hour-clock)  0951  (24 hour-clock)  0947  (24 hour-clock)   ***    Bolus Value (Units)  0 8 17     Meal Time  N/A no breakfast  (24 hour-clock)  0951  (24 hour-clock)  0947  (24 hour-clock)      Meal Carb Value (g) 0 46 84     Current Carb:Insulin Ratio 0 5.8 4.9    Lunch Bolus Dose    (Carb to Insulin Ratio from previous week)   _________  Time of Day of Lunch Bolus 1225  (24 hour-clock)  N/A no lunch  (24 hour-clock)  1351  (24 hour-clock)  ***     Bolus Value (Units)  18 0 16     Meal Time  1225  (24 hour-clock)  N/A no lunch  (24 hour-clock)  1351  (24 hour-clock)      Meal Carb Value (g) 86 0 79      Current Carb:Insulin Ratio  4.8  0  4.9    Dinner Bolus Dose    (Carb to Insulin Ratio from previous week)   _________  Time of Day of Dinner Bolus 1842  (24 hour-clock)  1620   (24 hour-clock)  1909  (24 hour-clock)  ***     Bolus Value (Units)  12 10 10      Meal Time  1842  (24 hour-clock)  1548  (24 hour-clock)  1909  (24 hour-clock)      Meal Carb Value (g) 98 78 73     Current Carb: Insulin Ratio   8.2  7.8  7.3       Identify the Subject???s 2-Hour Postprandial Glucose Values Leading into Titration Visit:      Note: To find the Median 2-hour Postprandial  Glucose (PPG) for a Meal, you must find the glucose value that occurs 2 hours after the mealtime stamp entered by the subject.        CARB:INSULIN RATIO BOLUS CALCULATIONS            Day 1  Day 2  Day 3    Date (most complete previous 3 days)  02Jan2025 (dd/mmm/yyyy)  01Jan2025  (dd/mmm/yyyy)  31Dec2024 (dd/mmm/yyyy)    Breakfast 2-Hour Postprandial Glucose    Glucose Value   N/A no breakfast 124 109 PPG TIme  N/A no breakfast  (24 hour-clock)  1152   (24 hour-clock)  1147  (24 hour-clock)    Lunch 2-Hour Postprandial Glucose       Glucose Value   150 N/A no lunch 137    PPG Time     1427   (24 hour-clock)  N/A no lunch  (24 hour-clock)  1552  (24 hour-clock)    Dinner 2-Hour Postprandial Glucose       Glucose Value  206 55 153    PPG Time     2042  (24 hour-clock)  1747  (24 hour-clock)  2107   (24 hour-clock)           Subject CARB:INSULIN RATIOS used from previous week:      Breakfast Lunch Dinner   Median 2-hour PPG (previous week) 134 N/A 205   Median 2-hour PPG from week leading into Visit 117 144 153   Carb:Insulin Ratios used from Previous Visit 8 5 5    New Carb:Insulin Ratios 8 5 5       If Investigator chose to deviate from protocol recommendation, please indicate why:    Subject had a documented Level 2 Hypoglycemia (<54 mg/dL) in the last 7 days          []    Subject frequency of Level 1 Hypoglycemia (70mg /dL) has increased in the last 7 days        []    Subject Unwilling                     []    Investigator Discretion           [x]  Explain: patient has been sick and sugars have been more erratic with holiday schedule     BOLUS INSULIN ADJUSTMENT PROTOCOL:    GOAL IS 2-HOUR POST-PRANDIAL BLOOD SUGAR < 140mg /dL. Titration detail is provided below:      The following Table will be used to determine any needed change in Carb to Insulin ratio.   In the table, the value of the ratio currently in use is given as ???Xcurrent???, and the adjusted value (determined based on 2-   hour postprandial glucose values, assessed by inspection of CGM profiles) is given as ???Xnew???.       NOTE: the decision to change the Carb to Insulin ratio should be based on three or more days??? worth of data for each meal in question, using the MEDIAN 2-hour post prandial glucose (not the MEAN).      Bolus Insulin Dosing Algorithm:   Algorithm for Adjusting Bolus Insulin     2-hour Postprandial Glucose (MEDIAN over 3 days)  1 Unit Insulin per   X gram Carbs    <100 mg/dL  Xnew = 1.6 of Xcurrent    100-140 mg/dL  Xnew = Xcurrent    161-096 mg/dL  Xnew = 0.8 of Xcurrent    160-200 mg/dL  Xnew = 0.7 of Xcurrent    >200 mg/dL  Xnew = 0.5 of Xcurrent      I have reviewed the information collected in the source data sheet as it applies to the Subject???s dosing titrations. The titrations have been reviewed with the subject, and I recommend the above dosing titration to be made.     This progress note may be timestamped after permission was given to the coordinator to proceed. Eligibility was determined by me, the investigator, prior to moving forward.

## 2023-11-09 ENCOUNTER — Ambulatory Visit
Admit: 2023-11-09 | Discharge: 2023-11-10 | Payer: BLUE CROSS/BLUE SHIELD | Attending: Student in an Organized Health Care Education/Training Program | Primary: Student in an Organized Health Care Education/Training Program

## 2023-11-09 DIAGNOSIS — E1065 Type 1 diabetes mellitus with hyperglycemia: Principal | ICD-10-CM

## 2023-11-09 NOTE — Unmapped (Signed)
BS 110 mg/dl. PP 11/09/2023 @ 10:15 AM. A1C done.

## 2023-11-10 NOTE — Unmapped (Signed)
I was immediately available via phone/pager or present on site.  I reviewed and discussed the case with the fellow, but did not see the patient.  I agree with the assessment and plan as documented in the fellow's note. Trystian Crisanto Jin Avish Torry, MD

## 2023-11-12 MED ORDER — LEVOTHYROXINE 25 MCG TABLET
ORAL_TABLET | Freq: Every day | ORAL | 3 refills | 90.00 days | Status: CP
Start: 2023-11-12 — End: 2024-11-11
  Filled 2023-11-17: qty 90, 90d supply, fill #0

## 2023-11-12 NOTE — Unmapped (Signed)
[facility] Refill Coordination Note    Specialty Medication(s) to be Shipped:   tacrolimus 1 MG capsule (PROGRAF)    Other medication(s) to be shipped:  levothyroxine     Sandra Underwood, DOB: December 02, 2000  Phone: [phone] (home)       All above HIPAA information was verified with patient.     Was a Nurse, learning disability used for this call? No    Completed refill call assessment today to schedule patient's medication shipment from the [facility]  [phone]).  All relevant notes have been reviewed.     Specialty medication(s) and dose(s) confirmed: Regimen is correct and unchanged.   Changes to medications: Jesa reports no changes at this time.  Changes to insurance: No  New side effects reported not previously addressed with a pharmacist or physician: None reported  Questions for the pharmacist: No    Confirmed patient received a Conservation officer, historic buildings and a Surveyor, mining with first shipment. The patient will receive a drug information handout for each medication shipped and additional FDA Medication Guides as required.       DISEASE/MEDICATION-SPECIFIC INFORMATION        N/A    SPECIALTY MEDICATION ADHERENCE     Medication Adherence    Patient reported X missed doses in the last month: 0  Specialty Medication: tacrolimus 1 MG capsule (PROGRAF)  Patient is on additional specialty medications: No  Informant: patient              Were doses missed due to medication being on hold? No    tacrolimus 1 MG capsule (PROGRAF): 10-14 days of medicine on hand     REFERRAL TO PHARMACIST     Referral to the pharmacist: Not needed      [facility]     Shipping address confirmed in Epic.       Delivery Scheduled: Yes, Expected medication delivery date: 11/18/2023.  However, Rx request for refills was sent to the provider as there are none remaining.     Medication will be delivered via UPS to the prescription address in Epic WAM.    Dan Europe   [facility]  Specialty Technician

## 2023-11-17 MED FILL — TACROLIMUS 1 MG CAPSULE, IMMEDIATE-RELEASE: ORAL | 30 days supply | Qty: 60 | Fill #1

## 2023-11-26 MED FILL — CETIRIZINE 10 MG TABLET: ORAL | 30 days supply | Qty: 120 | Fill #3

## 2023-11-27 ENCOUNTER — Ambulatory Visit: Admit: 2023-11-27 | Discharge: 2023-11-28

## 2023-11-27 DIAGNOSIS — Z006 Encounter for examination for normal comparison and control in clinical research program: Principal | ICD-10-CM

## 2023-11-27 MED ORDER — STUDY DP 01-2023-01 INSULIN DEGLUDEC 100 UNITS/ML INJECTION PEN
PEN_INJECTOR | 0 refills | 0.00 days | Status: CP
Start: 2023-11-27 — End: ?

## 2023-11-27 MED ORDER — STUDY DP 01-2023-01 HDV-LISPRO U-100 / PLACEBO INJECTION SOLUTION
0 refills | 0.00 days | Status: CP
Start: 2023-11-27 — End: ?

## 2023-11-27 NOTE — Unmapped (Signed)
[facility] Appointment Note  Study:  OPTI-2  Description: A Phase 2b Randomized, Double-blind Trial Comparing HDVInsulin Lispro Versus Insulin Lispro Alone in Adults with Type 1 Diabetes Receiving Insulin Degludec   Protocol:  DP 11-2021-01   Site Number: 05  [place] IRB#: [id number]  PI: Noelle Penner, MD, PhD  Visit: Maintenance Period 16  Source Version:1      Patient: Sandra Underwood  Date: 24Jan2025  DOB: [birth year]    Checklist:    X AE Review   X Concomitant Medications Review   X Vital Signs    X Pregnancy Test    X Supply dispensing as needed   X Hypoglycemic Symptoms Log Review (see provider note)   X CGM Data Sufficiency Review (see provider note)   X Basal and Bolus dosing review     Adverse Events  Has the subject experienced any Adverse Events since signing the Informed Consent?   []   Yes (If 'Yes', please complete an Adverse Events Form)  [x]   No    Has the subject taken any new medications or changed any medication usage since the last visit? No.(If ???Yes???, please complete a Concomitant Medications Form)   Medication Name  (Generic / Trade Name) Dose and Frequency Start Date  (dd/mmm/yyyy) Stop Date  (dd/mmm/yyyy) Class of Medication  Primary Indication             Hypoglycemic Symptoms Log     *Note that paper hypoglycemic symptoms log should be collected at in -person visits and data entered by site staff at that time. Please remind subject to bring the paper hypoglycemic symptoms log to the next in-person visit.      Was the Hypoglycemic Symptoms Log reviewed?   No. Reason: did not bring today-advised to bring at next appt.   Were any Hypoglycemic Symptoms experienced?   No.     CGM DATA SUFFICIENCY REVIEW    The ???Days with CGM Data??? value is located in the Clarity Software on the Subject???s Data page, on the right side of the screen. Please enter the % value noted for ???Days with CGM Data??? for the Date Range selected. Review the Clarity Software for date ranges with a value of >= 80%.       Days with CGM Data  94 %    Reminder:  There are TWO PARTS to these visits where insulin doses are titrated:    Titration Part 1 is recording data out of Clarity, reviewing it, and following both basal and bolus algorithms for titration.  This should be completed prior to the titration discussion with the subject.        Titration Part 2 is the visit with the patient, during which the titration recommendations are reviewed and implemented.        Note: These titration visits will only be completed during the Dose Optimization Period of the Study (not during the Maintenance Period, unless necessary and requested or indicated by Investigator).        Throughout the entirety of the study, including during run-in, Subjects should be recording the following Event information in real time into the G7 Phone App:   All Insulin doses (basal and bolus)   All MAIN Meal times, including carbohydrate value of meal. This is typically Breakfast, Lunch, and Dinner, all meals >15g of carbohydrate but should include at least 2 Main Meals per day   Additionally, all ???feeling low??? events must be recorded in the Dexcom G7 App  and recorded in the hypoglycemic symptoms log.    Please note that subject payment will be tied to completeness of data, and please also note that if a Subject tries to back-enter/back-fill information at a later date, the date/time stamps will not be correct in the system (they are not editable), and the data will not be usable.      Please remind the Subject not to change their carb to insulin ratios or basal insulin doses unless instructed to do so by the Investigator.   Patient reminded of the above? Yes.    Vital signs  Subject must rest 5 minutes before reading BP.     Time: 1015  BP: 105/73  Pulse(beats/min): 98  Oral temperature: 36.8C  Respiratory rate(breaths/min): 18  Weight 85.0kg    Labs    If WOCBP, was the urine pregnancy test performed? YesTime: 1028  What was the result? Negative    Insulin Dispensing - Visit    Please ensure to capture dispensing information in the Drug Accountability Log.          Was Basal Insulin dispensed?   Yes.   Was Bolus Insulin dispensed?   Yes.   Was HDV-Lispro / sWFI-Lispro prepared as per drug preparation instructions? (0.36mL HDV or sWFI into 10mL vial of Lispro)         Yes.       ID/Quantity  Dispensing Date Return Date ML   [id number] 16Sep2024  N/A N/A   [id number] C 16Sep2024 03Oct2024  4.4      [id number] 03Oct2024 N/A  N/A     [id number] 03Oct2024  02Dec2024  10 returned late, unused   V8343 03Oct2024  28Oct2024  1     V0963 28Oct2024  02Dec2024  0     W1027  28Oct2024  02Dec2024  4      V7041   02Dec2024  N/A 0 Pt states it was empty and accidentally threw it away    V7401  02Dec2024  03Jan2025  7.2     V6399 03Jan2025  24Jan2025  2.2     V2526  03Jan2025  24Jan2025  10 unopened   O5366  24Jan2025      V4821 24Jan2025              Device and Supply Dispensing Details(V4,V8)     Number of Dexcom G7 sensors dispensed to subject (as needed): 4     Sensor Serial Number(s):  [id number]     Number of insulin syringes dispensed to subject:  150   Number of pen needles dispensed to subject:  33   Number of BGM test strips dispensed to subject:  0   Hypoglycemic Logs 2     Other      Scheduling   Maintenance Period Week 20 (+/-4 days) In-Person: 18Feb2025 @1030   Maintenance Period Week 24 (+/-4 days) In-Person: 18Mar2025 @1030  *remind patient to return all IP and unused supplies for V25*  Maintenance Period Week 25 (+/-4 days) In-Person: 25Mar2025 @1030   Transition Period/Follow up Week 27(+/-4 days)Phone: 11Apr2025    Martin Majestic, CMA

## 2023-11-27 NOTE — Unmapped (Signed)
[facility] Appointment Note  Study:  OPTI-2  Description: A Phase 2b Randomized, Double-blind Trial Comparing HDVInsulin Lispro Versus Insulin Lispro Alone in Adults with Type 1 Diabetes Receiving Insulin Degludec   Protocol:  DP 11-2021-01   Site Number: 05  [place] IRB#: [id number]  PI: Noelle Penner, MD, PhD  Visit: 16  Source Version:1      Patient: Sandra Underwood  Date: 24Jan2025  DOB: 2001/09/29    Was the Hypoglycemic Symptoms Log discussed with the subject? *Note that paper hypoglycemic symptoms log should be collected at in -person visits and data entered by site staff at that time. Please remind subject to bring the paper hypoglycemic symptoms log to the next in-person visit.      []   Yes   [x]   No  Were any Hypoglycemic Symptoms experienced?   No.     BASAL DOSING REVIEW    Subject Median Basal Dose from Previous Week    Please check the Clarity Software to verify that basal dose is consistent with the value recommended by the PI, and enter the Subject???s basal dose for last week below. Please note that the subject should not be changing their basal doses day-to-day, so the dose should be the same for all recorded days.       Subject Median Basal Dose from Previous Week (Week leading into visit)  30 U    Is the Median Basal Dose from Previous Week (week leading into visit) the same as the Median Basal Insulin Dose at the last visit?    Yes*[x]  No []       *If No, was the change in Basal Insulin Dosing made for subject safety?   []  Yes*[]  No [x]  N/A  Please  describe the change in Basal Insulin Dosing:      Are any changes in Basal Insulin Dosing indicated for subject safety at this visit?  [x]  Yes*[]  No []  N/A  If Yes, please explain and indicate new dose: 29U   Why: sugars drop considerably overnight which is a risk for hypoglycemia, she is having hypos overnight on occasion as well     Was Basal Insulin dosing data reviewed with the subject?   Yes. }f no, specify reason:    Adverse Event []     Missed in Error []     Physician Decision[]      Other: []  _________________________      Record Basal Insulin Doses Leading into Visit in Table Below:   BASAL INSULIN DOSES (Week leading into visit)        Median Basal Dose for 3 days Leading Into Visit      Day 1  Day 2  Day 3      Date  23Jan2025   (dd/mmm/yyyy)  22Jan2025 (dd/mmm/yyyy)  21Jan2025 (dd/mmm/yyyy)  N/A    Basal Dose   Time of Day     2033  (24 hour-clock)  2129  (24 hour-clock)  2154  (24 hour-clock)  N/A     Value (Units)  30 30 30 30      Record Bolus Insulin Doses and Mealtime Carbohydrate Values Leading into Visit in Table Below:     Day 1  Day 2  Day 3  Did Subject Correctly Calculate and Follow Carb to Insulin Dosing Guidance? (comment for each meal)     Date  23Jan2025  (dd/mmm/yyyy)  21Jan2025(not enough data on 22)  (dd/mmm/yyyy)  20Jan2025   (dd/mmm/yyyy)  N/A    Breakfast Bolus Dose   (  Carb to Insulin Ratio from previous week)   _________  Time of Day of Breakfast Bolus 1011  (24 hour-clock)  0819  (24 hour-clock)  1024  (24 hour-clock)   yes    Bolus Value (Units)  15 15 10      Meal Time  1011  (24 hour-clock)  0819  (24 hour-clock)  1024  (24 hour-clock)      Meal Carb Value (g) 68 76 51     Current Carb:Insulin Ratio 4.5 5.1 5.1    Lunch Bolus Dose    (Carb to Insulin Ratio from previous week)   _________  Time of Day of Lunch Bolus N/A no lunch  (24 hour-clock)  N/A no lunch  (24 hour-clock)  N/A no lunch  (24 hour-clock)  NA     Bolus Value (Units)  0 0 0     Meal Time  N/A no lunch  (24 hour-clock)  N/A no lunch  (24 hour-clock)  N/A no lunch  (24 hour-clock)      Meal Carb Value (g) 0 0 0      Current Carb:Insulin Ratio  0  0  0    Dinner Bolus Dose    (Carb to Insulin Ratio from previous week)   _________  Time of Day of Dinner Bolus 1908  (24 hour-clock)  1912   (24 hour-clock)  1502  (24 hour-clock)  no     Bolus Value (Units)  9 12 18      Meal Time  1903  (24 hour-clock)  1912  (24 hour-clock)  1502  (24 hour-clock) Meal Carb Value (g) 70 78 78     Current Carb: Insulin Ratio   23.3  6.5  4.3      Are any changes in Bolus Insulin Dosing indicated for subject safety at this visit?  []  Yes* [x]  No  If Yes, please explain and indicate new dose below:     Subject CARB:INSULIN RATIOS used from previous week:      Breakfast Lunch Dinner   Carb:Insulin Ratios used from Previous Visit 5 5 8    New Carb:Insulin Ratios 5 5 8    Change? no no no   Was change made for subject safety?  N/A N/A N/A   Describe the change in Carb:Insulin factor: - - -         Encouraged more consistent bolusing for bedtime snack    This progress note may be timestamped after permission was given to the coordinator to proceed. Eligibility was determined by me, the investigator, prior to moving forward.

## 2023-11-27 NOTE — Unmapped (Signed)
duplicate

## 2023-12-08 NOTE — Unmapped (Signed)
[facility] Refill Coordination Note    Specialty Medication(s) to be Shipped:   tacrolimus 1 MG capsule (PROGRAF)    Other medication(s) to be shipped: No additional medications requested for fill at this time     Sandra Underwood, DOB: [birth year]  Phone: [phone] (home)       All above HIPAA information was verified with patient.     Was a Nurse, learning disability used for this call? No    Completed refill call assessment today to schedule patient's medication shipment from the [facility]  [phone]).  All relevant notes have been reviewed.     Specialty medication(s) and dose(s) confirmed: Regimen is correct and unchanged.   Changes to medications: Sandra Underwood reports no changes at this time.  Changes to insurance: No  New side effects reported not previously addressed with a pharmacist or physician: None reported  Questions for the pharmacist: No    Confirmed patient received a Conservation officer, historic buildings and a Surveyor, mining with first shipment. The patient will receive a drug information handout for each medication shipped and additional FDA Medication Guides as required.       DISEASE/MEDICATION-SPECIFIC INFORMATION        N/A    SPECIALTY MEDICATION ADHERENCE     Medication Adherence    Patient reported X missed doses in the last month: 0  Specialty Medication: tacrolimus 1 MG capsule (PROGRAF)  Patient is on additional specialty medications: No              Were doses missed due to medication being on hold? No    tacrolimus 1 MG capsule (PROGRAF): 7-10 days of medicine on hand     REFERRAL TO PHARMACIST     Referral to the pharmacist: Not needed      [facility]     Shipping address confirmed in Epic.       Delivery Scheduled: Yes, Expected medication delivery date: 12/15/2023.  However, Rx request for refills was sent to the provider as there are none remaining.     Medication will be delivered via UPS to the prescription address in Epic W[facility].    Dan Europe   [facility]  Specialty Technician

## 2023-12-14 MED FILL — TACROLIMUS 1 MG CAPSULE, IMMEDIATE-RELEASE: ORAL | 30 days supply | Qty: 60 | Fill #2

## 2023-12-21 NOTE — Unmapped (Signed)
 [facility] Appointment Note  Study:  OPTI-2  Description: A Phase 2b Randomized, Double-blind Trial Comparing HDVInsulin Lispro Versus Insulin Lispro Alone in Adults with Type 1 Diabetes Receiving Insulin Degludec   Protocol:  DP 11-2021-01   Site Number: 05  [place] IRB#: [id number]  PI: Noelle Penner, MD, PhD  Visit: Maintenance Period 20  Source Version:1      Patient: Sandra Underwood  Date: 18Feb2025  DOB: February 01, 2001    Checklist:    X AE Review   X Concomitant Medications Review   X Vital Signs    X Pregnancy Test     Supply dispensing as needed    Hypoglycemic Symptoms Log Review (see provider note)    CGM Data Sufficiency Review (see provider note)    Basal and Bolus dosing review   Updated Informed Consent  Patient signed latest consent form approved 22Jul2025. We discussed, explained, and reviewed the updates to the consent form with the subject in a private, quiet environment. All of the her questions were answered and concerns were addressed. Also, she was given time to review the consent form and to discuss continued participation in the study with family members/others if wanted. Furthermore, she has agreed to continue participation in the study and signed/dated the updated consent form prior to any study procedures. The consent did not require a witness by a third party nor assent. A copy of the signed and dated consent form was given to the her.    Adverse Events  Has the subject experienced any Adverse Events since signing the Informed Consent?   []   Yes (If 'Yes', please complete an Adverse Events Form)  [x]   No    Has the subject taken any new medications or changed any medication usage since the last visit? No.(If ???Yes???, please complete a Concomitant Medications Form)   Medication Name  (Generic / Trade Name) Dose and Frequency Start Date  (dd/mmm/yyyy) Stop Date  (dd/mmm/yyyy) Class of Medication  Primary Indication             Hypoglycemic Symptoms Log     *Note that paper hypoglycemic symptoms log should be collected at in -person visits and data entered by site staff at that time. Please remind subject to bring the paper hypoglycemic symptoms log to the next in-person visit.      Was the Hypoglycemic Symptoms Log reviewed?   No. Reason: did not use.   Were any Hypoglycemic Symptoms experienced?   No.     CGM DATA SUFFICIENCY REVIEW    The ???Days with CGM Data??? value is located in the Clarity Software on the Subject???s Data page, on the right side of the screen. Please enter the % value noted for ???Days with CGM Data??? for the Date Range selected. Review the Clarity Software for date ranges with a value of >= 80%.       Days with CGM Data  95 %    Reminder:  There are TWO PARTS to these visits where insulin doses are titrated:    Titration Part 1 is recording data out of Clarity, reviewing it, and following both basal and bolus algorithms for titration.  This should be completed prior to the titration discussion with the subject.        Titration Part 2 is the visit with the patient, during which the titration recommendations are reviewed and implemented.        Note: These titration visits will only be completed during the  Dose Optimization Period of the Study (not during the Maintenance Period, unless necessary and requested or indicated by Investigator).        Throughout the entirety of the study, including during run-in, Subjects should be recording the following Event information in real time into the G7 Phone App:   All Insulin doses (basal and bolus)   All MAIN Meal times, including carbohydrate value of meal. This is typically Breakfast, Lunch, and Dinner, all meals >15g of carbohydrate but should include at least 2 Main Meals per day   Additionally, all ???feeling low??? events must be recorded in the Dexcom G7 App and recorded in the hypoglycemic symptoms log.    Please note that subject payment will be tied to completeness of data, and please also note that if a Subject tries to back-enter/back-fill information at a later date, the date/time stamps will not be correct in the system (they are not editable), and the data will not be usable.      Please remind the Subject not to change their carb to insulin ratios or basal insulin doses unless instructed to do so by the Investigator.   Patient reminded of the above? Yes.    Vital signs  Subject must rest 5 minutes before reading BP.     Time: 1028  BP: 96/75  Pulse(beats/min): 97  Oral temperature: 36.8C  Respiratory rate(breaths/min): 20  Weight 85.8kg    Labs    If WOCBP, was the urine pregnancy test performed? YesTime: 1050  What was the result? Negative    Insulin Dispensing - Visit    Please ensure to capture dispensing information in the Drug Accountability Log.          Was Basal Insulin dispensed?   Yes.   Was Bolus Insulin dispensed?   Yes.   Was HDV-Lispro / sWFI-Lispro prepared as per drug preparation instructions? (0.61mL HDV or sWFI into 10mL vial of Lispro)         Yes.        ID/Quantity  Dispensing Date Return Date ML   [id number] 16Sep2024  N/A N/A   [id number] C 16Sep2024 03Oct2024  4.4      [id number] 03Oct2024 N/A  N/A     [id number] 03Oct2024  02Dec2024  10 returned late, unused   V8343 03Oct2024  28Oct2024  1     V0963 28Oct2024  02Dec2024  0     V6870  28Oct2024  02Dec2024  4      V7041   02Dec2024  N/A 0 Pt states it was empty and accidentally threw it away    V7401  02Dec2024  03Jan2025  7.2     V6399 03Jan2025  24Jan2025  2.2     V2526  03Jan2025  24Jan2025  10 unopened   V6944  24Jan2025  18Feb2025  10  unopened   V4821 24Jan2025  18Feb2025  1     U2725 18Feb2025      V8917 18Feb2025          Number of Dexcom G7 sensors dispensed to subject (as needed): 4     Sensor Serial Number(s):  [id number]     Number of insulin syringes dispensed to subject:  150   Number of pen needles dispensed to subject:  33   Number of BGM test strips dispensed to subject:  0   Hypoglycemic Logs 2 Other      Scheduling     Maintenance Period Week 24 (+/-4 days) In-Person: 18Mar2025 @1030  *remind patient to return all  IP and unused supplies for V25*  Maintenance Period Week 25 (+/-4 days) In-Person: 25Mar2025 @1030   Transition Period/Follow up Week 27(+/-4 days)Phone: 11Apr2025    Martin Majestic, CMA

## 2023-12-21 NOTE — Unmapped (Unsigned)
 error Physician Decision[]      Other: []  _________________________      Record Basal Insulin Doses Leading into Visit in Table Below:   BASAL INSULIN DOSES (Week leading into visit)        Median Basal Dose for 3 days Leading Into Visit      Day 1  Day 2  Day 3      Date  17Feb2025     (dd/mmm/yyyy)  16Feb2025 (dd/mmm/yyyy)  15Feb2025 (dd/mmm/yyyy)  N/A    Basal Dose   Time of Day     ***  (24 hour-clock)  2108  (24 hour-clock)  2235  (24 hour-clock)  N/A     Value (Units)  *** 30 30 30      Record Bolus Insulin Doses and Mealtime Carbohydrate Values Leading into Visit in Table Below:     Day 1  Day 2  Day 3  Did Subject Correctly Calculate and Follow Carb to Insulin Dosing Guidance? (comment for each meal)     Date  17Feb2025  (dd/mmm/yyyy)  16Feb2025  (dd/mmm/yyyy)  15Feb2025   (dd/mmm/yyyy)  N/A    Breakfast Bolus Dose   (Carb to Insulin Ratio from previous week)   _________  Time of Day of Breakfast Bolus 0821  (24 hour-clock)  1043  (24 hour-clock)  1117  (24 hour-clock)   ***    Bolus Value (Units)  11 15 16      Meal Time  0821  (24 hour-clock)  1043  (24 hour-clock)  1117  (24 hour-clock)      Meal Carb Value (g) 48 60 78     Current Carb:Insulin Ratio 4.4 4 4.9    Lunch Bolus Dose    (Carb to Insulin Ratio from previous week)   _________  Time of Day of Lunch Bolus ***  (24 hour-clock)  N/A no lunch  (24 hour-clock)  N/A no lunch  (24 hour-clock)  ***     Bolus Value (Units)  *** 0 0     Meal Time  ***  (24 hour-clock)  N/A  (24 hour-clock)  N/A  (24 hour-clock)      Meal Carb Value (g) *** 0 0      Current Carb:Insulin Ratio  ***  0  0    Dinner Bolus Dose    (Carb to Insulin Ratio from previous week)   _________  Time of Day of Dinner Bolus ***  (24 hour-clock)  1626   (24 hour-clock)  1639  (24 hour-clock)  ***     Bolus Value (Units)  *** 15 18     Meal Time  ***  (24 hour-clock)  1626  (24 hour-clock)  1639  (24 hour-clock)      Meal Carb Value (g) *** 74 111     Current Carb: Insulin Ratio   ***  4.9  6.2 Are any changes in Bolus Insulin Dosing indicated for subject safety at this visit?  []  Yes* []  No  If Yes, please explain and indicate new dose below:     Subject CARB:INSULIN RATIOS used from previous week:      Breakfast Lunch Dinner   Carb:Insulin Ratios used from Previous Visit 5 5 8    New Carb:Insulin Ratios *** *** ***   Change? {ZOX:09604} {VWU:98119} {JYN:82956}   Was change made for subject safety?  {YES/NA/NO:84979} {YES/NA/NO:84979} {YES/NA/NO:84979}   Describe the change in Carb:Insulin factor:            ***  This progress note may be timestamped after permission was given to the coordinator to proceed. Eligibility was determined by me, the investigator, prior to moving forward.

## 2023-12-22 ENCOUNTER — Ambulatory Visit: Admit: 2023-12-22 | Discharge: 2023-12-23

## 2023-12-22 DIAGNOSIS — Z006 Encounter for examination for normal comparison and control in clinical research program: Principal | ICD-10-CM

## 2023-12-22 MED ORDER — STUDY DP 01-2023-01 INSULIN DEGLUDEC 100 UNITS/ML INJECTION PEN
PEN_INJECTOR | 0 refills | 0.00 days | Status: CP
Start: 2023-12-22 — End: ?

## 2023-12-22 MED ORDER — STUDY DP 01-2023-01 HDV-LISPRO U-100 / PLACEBO INJECTION SOLUTION
0 refills | 0.00 days | Status: CP
Start: 2023-12-22 — End: ?

## 2023-12-22 NOTE — Unmapped (Signed)
 [facility] Appointment Note  Study:  OPTI-2  Description: A Phase 2b Randomized, Double-blind Trial Comparing HDVInsulin Lispro Versus Insulin Lispro Alone in Adults with Type 1 Diabetes Receiving Insulin Degludec   Protocol:  DP 11-2021-01   Site Number: 05  [place] IRB#: [id number]  PI: Noelle Penner, MD, PhD  Visit: 20  Source Version:1    Patient: Sandra Underwood  Date: 12/22/23  DOB: [birth year]    Was the Hypoglycemic Symptoms Log discussed with the subject? *Note that paper hypoglycemic symptoms log should be collected at in -person visits and data entered by site staff at that time. Please remind subject to bring the paper hypoglycemic symptoms log to the next in-person visit.      []   Yes   [x]   No  Were any Hypoglycemic Symptoms experienced?   No.     BASAL DOSING REVIEW    Subject Median Basal Dose from Previous Week    Please check the Clarity Software to verify that basal dose is consistent with the value recommended by the PI, and enter the Subject???s basal dose for last week below. Please note that the subject should not be changing their basal doses day-to-day, so the dose should be the same for all recorded days.       Subject Median Basal Dose from Previous Week (Week leading into visit)  29 (but she is taking 30) U    Is the Median Basal Dose from Previous Week (week leading into visit) the same as the Median Basal Insulin Dose at the last visit?    [x]  Yes*[]  No   However, it was discussed to go to 29U at last visit.     *If No, was the change in Basal Insulin Dosing made for subject safety?   []  Yes*[]  No [x]  N/A  Please  describe the change in Basal Insulin Dosing: she increased on her own bc she was having hyperglycemia     Are any changes in Basal Insulin Dosing indicated for subject safety at this visit?  []  Yes*[x]  No []  N/A  If Yes, please explain and indicate new dose:    Why:     Was Basal Insulin dosing data reviewed with the subject?   Yes. }f no, specify reason: Adverse Event []     Missed in Error []     Physician Decision[]      Other: []  _________________________      Record Basal Insulin Doses Leading into Visit in Table Below:   BASAL INSULIN DOSES (Week leading into visit)        Median Basal Dose for 3 days Leading Into Visit      Day 1  Day 2  Day 3      Date  17Feb2025     (dd/mmm/yyyy)  16Feb2025 (dd/mmm/yyyy)  15Feb2025 (dd/mmm/yyyy)  N/A    Basal Dose   Time of Day     2041  (24 hour-clock)  2108  (24 hour-clock)  2235  (24 hour-clock)  N/A     Value (Units)  30 30 30 30      Record Bolus Insulin Doses and Mealtime Carbohydrate Values Leading into Visit in Table Below:     Day 1  Day 2  Day 3  Did Subject Correctly Calculate and Follow Carb to Insulin Dosing Guidance? (comment for each meal)     Date  17Feb2025  (dd/mmm/yyyy)  16Feb2025  (dd/mmm/yyyy)  15Feb2025   (dd/mmm/yyyy)  N/A    Breakfast Bolus Dose   (  Carb to Insulin Ratio from previous week)   _________  Time of Day of Breakfast Bolus 0821  (24 hour-clock)  1043  (24 hour-clock)  1117  (24 hour-clock)   yes    Bolus Value (Units)  11 15 16      Meal Time  0821  (24 hour-clock)  1043  (24 hour-clock)  1117  (24 hour-clock)      Meal Carb Value (g) 48 60 78     Current Carb:Insulin Ratio 4.4 4 4.9    Lunch Bolus Dose    (Carb to Insulin Ratio from previous week)   _________  Time of Day of Lunch Bolus N/A no lunch  (24 hour-clock)  N/A no lunch  (24 hour-clock)  N/A no lunch  (24 hour-clock)  NA     Bolus Value (Units)  0 0 0     Meal Time  N/A  (24 hour-clock)  N/A  (24 hour-clock)  N/A  (24 hour-clock)      Meal Carb Value (g) 0 0 0      Current Carb:Insulin Ratio  0  0  0    Dinner Bolus Dose    (Carb to Insulin Ratio from previous week)   _________  Time of Day of Dinner Bolus 1859  (24 hour-clock)  1626   (24 hour-clock)  1639  (24 hour-clock)  Yes, sometimes looks like she takes more bc of hyperglycemia     Bolus Value (Units)  12 15 18      Meal Time  1859  (24 hour-clock)  1626  (24 hour-clock) 1639  (24 hour-clock)      Meal Carb Value (g) 97 74 111     Current Carb: Insulin Ratio   8.1  4.9  6.2      Are any changes in Bolus Insulin Dosing indicated for subject safety at this visit?  []  Yes* [x]  No  If Yes, please explain and indicate new dose below:     Subject CARB:INSULIN RATIOS used from previous week:      Breakfast Lunch Dinner   Carb:Insulin Ratios used from Previous Visit 5 5 8    New Carb:Insulin Ratios 5 5 8    Change? no no no   Was change made for subject safety?  N/A N/A N/A   Describe the change in Carb:Insulin factor:        This progress note may be timestamped after permission was given to the coordinator to proceed. Eligibility was determined by me, the investigator, prior to moving forward.

## 2024-01-01 NOTE — Unmapped (Signed)
 [place] [facility]  [employer] (NHP) Maintenance      DM lab timeline targets updated per program requirements.   Labs are up to date.        Tenna Delaine, CPhT   Certified Pharmacy Technician   [place] [facility] ([facility])   P [phone], F [phone]

## 2024-01-12 NOTE — Unmapped (Signed)
 [facility] Refill Coordination Note    Specialty Medication(s) to be Shipped:   CF/Pulmonary/Asthma: Tacrolimus    Other medication(s) to be shipped:  Cetirizine     Sandra Underwood, DOB: 12/21/2000  Phone: [phone] (home)       All above HIPAA information was verified with patient.     Was a Nurse, learning disability used for this call? No    Completed refill call assessment today to schedule patient's medication shipment from the [facility]  [phone]).  All relevant notes have been reviewed.     Specialty medication(s) and dose(s) confirmed: Regimen is correct and unchanged.   Changes to medications: [name] reports no changes at this time.  Changes to insurance: No  New side effects reported not previously addressed with a pharmacist or physician: None reported  Questions for the pharmacist: No    Confirmed patient received a Conservation officer, historic buildings and a Surveyor, mining with first shipment. The patient will receive a drug information handout for each medication shipped and additional FDA Medication Guides as required.       DISEASE/MEDICATION-SPECIFIC INFORMATION        N/A    SPECIALTY MEDICATION ADHERENCE     Medication Adherence    Patient reported X missed doses in the last month: 0  Specialty Medication: tacrolimus 1 MG capsule (PROGRAF)  Patient is on additional specialty medications: No              Were doses missed due to medication being on hold? No    tacrolimus 1 MG capsule (PROGRAF): 12 days of medicine on hand       REFERRAL TO PHARMACIST     Referral to the pharmacist: Not needed      [facility]     Shipping address confirmed in Epic.     Cost and Payment: Patient has a copay of $10.41. They are aware and have authorized the pharmacy to charge the credit card on file.    Delivery Scheduled: Yes, Expected medication delivery date: 01/15/24.  However, Rx request for refills was sent to the provider as there are none remaining. Medication will be delivered via UPS to the prescription address in Epic WAM.    Dan Europe   [facility]  Specialty Technician

## 2024-01-18 NOTE — Unmapped (Addendum)
 [facility] Appointment Note  Study:  OPTI-2  Description: A Phase 2b Randomized, Double-blind Trial Comparing HDVInsulin Lispro Versus Insulin Lispro Alone in Adults with Type 1 Diabetes Receiving Insulin Degludec   Protocol:  DP 11-2021-01   Site Number: 05  [place] IRB#: [id number]  PI: Noelle Penner, MD, PhD  Visit: Maintenance Period 24  Source Version:1      Patient: Sandra Underwood  Date: 18Mar2025  DOB: July 19, 2001    Checklist:    X AE Review   X Concomitant Medications Review   X Vital Signs    X Pregnancy Test    X Supply dispensing as needed   X Hypoglycemic Symptoms Log Review (see provider note)   X CGM Data Sufficiency Review (see provider note)   X Basal and Bolus dosing review   Updated Informed Consent  Patient signed latest consent form approved 23Sep2024. We discussed, explained, and reviewed the updates to the consent form with the subject in a private, quiet environment. All of the her questions were answered and concerns were addressed. Also, she was given time to review the consent form and to discuss continued participation in the study with family members/others if wanted. Furthermore, she has agreed to continue participation in the study and signed/dated the updated consent form prior to any study procedures. The consent did not require a witness by a third party nor assent. A copy of the signed and dated consent form was given to the her.  Adverse Events  Has the subject experienced any Adverse Events since signing the Informed Consent?   []   Yes (If 'Yes', please complete an Adverse Events Form)  [x]   No    Has the subject taken any new medications or changed any medication usage since the last visit? No.(If ???Yes???, please complete a Concomitant Medications Form)   Medication Name  (Generic / Trade Name) Dose and Frequency Start Date  (dd/mmm/yyyy) Stop Date  (dd/mmm/yyyy) Class of Medication  Primary Indication             Hypoglycemic Symptoms Log     *Note that paper hypoglycemic symptoms log should be collected at in -person visits and data entered by site staff at that time. Please remind subject to bring the paper hypoglycemic symptoms log to the next in-person visit.      Was the Hypoglycemic Symptoms Log reviewed?   No. Reason: did not bring, will bring next week.   Were any Hypoglycemic Symptoms experienced?   Yes. Reason: has not had many so unsure.     CGM DATA SUFFICIENCY REVIEW    The ???Days with CGM Data??? value is located in the Clarity Software on the Subject???s Data page, on the right side of the screen. Please enter the % value noted for ???Days with CGM Data??? for the Date Range selected. Review the Clarity Software for date ranges with a value of >= 80%.       Days with CGM Data  94 %    Reminder:  There are TWO PARTS to these visits where insulin doses are titrated:    Titration Part 1 is recording data out of Clarity, reviewing it, and following both basal and bolus algorithms for titration.  This should be completed prior to the titration discussion with the subject.        Titration Part 2 is the visit with the patient, during which the titration recommendations are reviewed and implemented.        Note:  These titration visits will only be completed during the Dose Optimization Period of the Study (not during the Maintenance Period, unless necessary and requested or indicated by Investigator).        Throughout the entirety of the study, including during run-in, Subjects should be recording the following Event information in real time into the G7 Phone App:   All Insulin doses (basal and bolus)   All MAIN Meal times, including carbohydrate value of meal. This is typically Breakfast, Lunch, and Dinner, all meals >15g of carbohydrate but should include at least 2 Main Meals per day   Additionally, all ???feeling low??? events must be recorded in the Dexcom G7 App and recorded in the hypoglycemic symptoms log.    Please note that subject payment will be tied to completeness of data, and please also note that if a Subject tries to back-enter/back-fill information at a later date, the date/time stamps will not be correct in the system (they are not editable), and the data will not be usable.      Please remind the Subject not to change their carb to insulin ratios or basal insulin doses unless instructed to do so by the Investigator.   Patient reminded of the above? Yes.    Vital signs  Subject must rest 5 minutes before reading BP.     Time: 1023  BP: 112/79  Pulse(beats/min): 101  Oral temperature: 36.7C  Respiratory rate(breaths/min): 18  Weight 85.7kg    Labs    If WOCBP, was the urine pregnancy test performed? YesTime: 1030  What was the result? Negative    Insulin Dispensing - Visit    Please ensure to capture dispensing information in the Drug Accountability Log.          Was Basal Insulin dispensed?   Yes.   Was Bolus Insulin dispensed?   Yes.   Was HDV-Lispro / sWFI-Lispro prepared as per drug preparation instructions? (0.44mL HDV or sWFI into 10mL vial of Lispro)         Yes.       ID/Quantity  Dispensing Date Return Date ML   [id number] 16Sep2024  N/A N/A   [id number] C 16Sep2024 03Oct2024  4.4      [id number] 03Oct2024 N/A  N/A     [id number] 03Oct2024  02Dec2024  10 returned late, unused   [id number] 03Oct2024  28Oct2024  1     [id number] 28Oct2024  02Dec2024  0     V6870  28Oct2024  02Dec2024  4      V7041   02Dec2024  N/A 0 Pt states it was empty and accidentally threw it away    V7401  02Dec2024  03Jan2025  7.2     V6399 03Jan2025  24Jan2025  2.2     V2526  03Jan2025  24Jan2025  10 unopened   V6944  24Jan2025  18Feb2025  10  unopened   V4821 24Jan2025  18Feb2025  1     V7633 18Feb2025  18Mar2025  10     V5917 18Feb2025  18Mar2025  0     [id number] 18Mar2025        Device and Supply Dispensing Details(V4,V8)     Number of Dexcom G7 sensors dispensed to subject (as needed): 0     Sensor Serial Number(s):  0     Number of insulin syringes dispensed to subject:  0   Number of pen needles dispensed to subject:  0   Number of BGM test strips dispensed to subject:  0  Hypoglycemic Logs 1     Other      Scheduling     *remind patient to return all IP and unused supplies for V25*  Maintenance Period Week 25 (+/-4 days) In-Person: 25Mar2025 @1030   Transition Period/Follow up Week 27(+/-4 days)Phone: 11Apr2025    Martin Majestic, CMA

## 2024-01-18 NOTE — Unmapped (Signed)
 [facility] Appointment Note  Study:  OPTI-2  Description: A Phase 2b Randomized, Double-blind Trial Comparing HDVInsulin Lispro Versus Insulin Lispro Alone in Adults with Type 1 Diabetes Receiving Insulin Degludec   Protocol:  DP 11-2021-01   Site Number: 05  [place] IRB#: [id number]  PI: Sandra Penner, MD, PhD  Visit: 24  Source Version:1      Patient: Sandra Underwood  Date: 18Mar2025  DOB: June 30, 2001    Was the Hypoglycemic Symptoms Log discussed with the subject? *Note that paper hypoglycemic symptoms log should be collected at in -person visits and data entered by site staff at that time. Please remind subject to bring the paper hypoglycemic symptoms log to the next in-person visit.      []   Yes   [x]   No  Were any Hypoglycemic Symptoms experienced?   No.     BASAL DOSING REVIEW    Subject Median Basal Dose from Previous Week    Please check the Clarity Software to verify that basal dose is consistent with the value recommended by the PI, and enter the Subject???s basal dose for last week below. Please note that the subject should not be changing their basal doses day-to-day, so the dose should be the same for all recorded days.       Subject Median Basal Dose from Previous Week (Week leading into visit)  30 U    Is the Median Basal Dose from Previous Week (week leading into visit) the same as the Median Basal Insulin Dose at the last visit?   [x]  Yes*[]  No      *If No, was the change in Basal Insulin Dosing made for subject safety?   []  Yes*[]  No []  N/A  Please  describe the change in Basal Insulin Dosing: 0     Are any changes in Basal Insulin Dosing indicated for subject safety at this visit?  []  Yes*[x]  No []  N/A  If Yes, please explain and indicate new dose: n/a   Why:     Was Basal Insulin dosing data reviewed with the subject?   Yes. }f no, specify reason:    Adverse Event []     Missed in Error []     Physician Decision[]      Other: []  _________________________      Record Basal Insulin Doses Leading into Visit in Table Below:   BASAL INSULIN DOSES (Week leading into visit)        Median Basal Dose for 3 days Leading Into Visit      Day 1  Day 2  Day 3      Date  17Mar2025     (dd/mmm/yyyy)  16Mar2025 (dd/mmm/yyyy)  15Mar2025 (dd/mmm/yyyy)  N/A    Basal Dose   Time of Day     2215  (24 hour-clock)  2336  (24 hour-clock)  2308  (24 hour-clock)  N/A     Value (Units)  30 30 30 30      Record Bolus Insulin Doses and Mealtime Carbohydrate Values Leading into Visit in Table Below:     Day 1  Day 2  Day 3  Did Subject Correctly Calculate and Follow Carb to Insulin Dosing Guidance? (comment for each meal)     Date  17Mar2025(dd/mmm/yyyy)  16Mar2025  (dd/mmm/yyyy)  15Mar2025   (dd/mmm/yyyy)  N/A    Breakfast Bolus Dose   (Carb to Insulin Ratio from previous week)   _________  Time of Day of Breakfast Bolus 1115  (24 hour-clock)  1114  (24 hour-clock)  1013  (24 hour-clock)   --    Bolus Value (Units)  10 10 8      Meal Time  1114  (24 hour-clock)  1114  (24 hour-clock)  1013  (24 hour-clock)      Meal Carb Value (g) 48 50 40     Current Carb:Insulin Ratio 4.8 5 5     Lunch Bolus Dose    (Carb to Insulin Ratio from previous week)   _________  Time of Day of Lunch Bolus N/A no lunch  (24 hour-clock)  N/A no lunch  (24 hour-clock)  N/A no lunch  (24 hour-clock)       Bolus Value (Units)  0 0 0     Meal Time  N/A  (24 hour-clock)  N/A  (24 hour-clock)  N/A  (24 hour-clock)      Meal Carb Value (g) 0 0 0      Current Carb:Insulin Ratio  0  0  0    Dinner Bolus Dose    (Carb to Insulin Ratio from previous week)   _________  Time of Day of Dinner Bolus 1952  (24 hour-clock)  2112   (24 hour-clock)  1952  (24 hour-clock)       Bolus Value (Units)  18 8 16      Meal Time  1952  (24 hour-clock)  2112  (24 hour-clock)  1953  (24 hour-clock)      Meal Carb Value (g) 137 68 81     Current Carb: Insulin Ratio   7.6  8.5  5.1      Are any changes in Bolus Insulin Dosing indicated for subject safety at this visit?  [x]  Yes* []  No  If Yes, please explain and indicate new dose below::    Change Correction Factor to 1:37.5   > 150 (half way between 1:25 and 1:50).  => pt does calculations mentally and felt comfortable with this plan (as opposed to changing target to going to CF 25 with current target of 150).      Subject CARB:INSULIN RATIOS used from previous week:      Breakfast Lunch Dinner   Carb:Insulin Ratios used from Previous Visit 5 5 8    New Carb:Insulin Ratios -- -- --   Change? No No No   Was change made for subject safety?  No No No   Describe the change in Carb:Insulin factor:            Sandra Underwood notes that she has been doing well with her basal dose and has not had fasting hypoglycemia but she continues to have postprandial hyperglycemia during the day.  She says that she has shifted her diet to a gluten-free diet and has had trouble adjusting to carb estimation in that setting.  She generally boluses herself 2 or 3 times a day but ends up doing it after she eats sometimes or sometimes overriding the bolus calculation.    She notes that she has been under a lot of good deal of stress recently and that is why she feels she has not been attending to her glycemic control is much as she has in the past.  She has also not been logging her bolus doses as much recently which is reflected in her CGM which shows that two thirds of her daily insulin dose is long-acting and roughly just over third as fast acting.    We discussed ways to address her postprandial hyperglycemia.  Currently  she uses a correction factor of 1-50 greater than 150 in addition to her insulin carb ratio that is usually about 5.  She is fearful of hypoglycemia which she says upsets her parents particularly if she has an episode.  Given that she has been very conservative with her bolus settings.  We discussed increasing her correction factor to 1-25 greater than 150 but that this what might be too much for her.  She does the calculations in her head so going to a number of in between 25 to 50 is more difficult for her so we discussed doing the calculation at 1-25 and 1-50 and then giving herself an insulin dose halfway in between that which she was agreeable to trying.    Next appt for study is 01/26/24 and then she Underwood have a phone call 2 weeks after that (early April).  Her next appointment with her diabetologist  - Dr. Ezekiel Ina - is on June 30th, 2025.        This progress note may be timestamped after permission was given to the coordinator to proceed. Eligibility was determined by me, the investigator, prior to moving forward.

## 2024-01-19 ENCOUNTER — Ambulatory Visit: Admit: 2024-01-19 | Discharge: 2024-01-20

## 2024-01-19 MED ORDER — STUDY DP 01-2023-01 HDV-LISPRO U-100 / PLACEBO INJECTION SOLUTION
0 refills | 0.00 days | Status: CP
Start: 2024-01-19 — End: ?

## 2024-01-19 MED ORDER — STUDY DP 01-2023-01 INSULIN DEGLUDEC 100 UNITS/ML INJECTION PEN
PEN_INJECTOR | 0 refills | 0.00 days | Status: CP
Start: 2024-01-19 — End: ?

## 2024-01-25 NOTE — Unmapped (Signed)
 [facility] Appointment Note  Study:  OPTI-2  Description: A Phase 2b Randomized, Double-blind Trial Comparing HDVInsulin Lispro Versus Insulin Lispro Alone in Adults with Type 1 Diabetes Receiving Insulin Degludec   Protocol:  DP 11-2021-01   Site Number: 05  [place] IRB#: [id number]  PI: Noelle Penner, MD, PhD  Visit: Week 25 End of Maintenance Period  Source Version:1      Patient: Sandra Underwood  Date: 25Mar2025  DOB: 07-23-2001    BASAL DOSING REVIEW AND TITRATION    Subject Basal Dose from Previous Week    This value was recommended by the PI in the previous visit.  Please check the Clarity Software to verify that basal dose is consistent and enter the Subject???s basal dose for last week below. Please note that the subject should not be changing their basal doses day-to-day, so the dose should be the same for all recorded days.    Subject Basal Dose from Previous Week (Week leading into visit)  30U        Record Basal Insulin Doses Leading into Visit in Table Below:   BASAL INSULIN DOSES (Week leading into visit)        Median Basal Dose for 3 days Leading Into Visit      Day 1  Day 2  Day 3      Date  24Mar2025     (dd/mmm/yyyy)  23Mar2025 (dd/mmm/yyyy)  22Mar2025 (dd/mmm/yyyy)  N/A    Basal Dose   Time of Day     0954  (24 hour-clock)  2218  (24 hour-clock)  2315  (24 hour-clock)  N/A     Value (Units)  30 30 30 30      Subject Basal Dose at start of study 34U   New Basal Dose for Subject:   30U       Record Bolus Insulin Doses and Mealtime Carbohydrate Values Leading into Visit in Table Below:     Day 1  Day 2  Day 3  Did Subject Correctly Calculate and Follow Carb to Insulin Dosing Guidance? (comment for each meal)     Date  24Mar2025  (dd/mmm/yyyy)  23Mar2025  (dd/mmm/yyyy)  22Mar2025   (dd/mmm/yyyy)  N/A    Breakfast Bolus Dose   (Carb to Insulin Ratio from previous week)   _________  Time of Day of Breakfast Bolus 1043  (24 hour-clock)  1027  (24 hour-clock)  N/A no breakfast  (24 hour-clock)       Bolus Value (Units)  11 15 0     Meal Time  1043  (24 hour-clock)  1027  (24 hour-clock)  N/A  (24 hour-clock)      Meal Carb Value (g) 56 72 0     Current Carb:Insulin Ratio 5.1 4.8 0    Lunch Bolus Dose    (Carb to Insulin Ratio from previous week)   _________  Time of Day of Lunch Bolus N/A no lunch  (24 hour-clock)  N/A no lunch  (24 hour-clock)  1229  (24 hour-clock)       Bolus Value (Units)  0 0 16     Meal Time  N/A   (24 hour-clock)  N/A  (24 hour-clock)  1229  (24 hour-clock)      Meal Carb Value (g) 0 0 79      Current Carb:Insulin Ratio  0  0  5    Dinner Bolus Dose    (Carb to Insulin Ratio from previous week)  _________  Time of Day of Dinner Bolus 1836  (24 hour-clock)  1830(pt reported)   (24 hour-clock)  1905  (24 hour-clock)       Bolus Value (Units)  15 14 20      Meal Time  1836  (24 hour-clock)  1830(pt reported)  (24 hour-clock)  1905  (24 hour-clock)      Meal Carb Value (g) 118 110(pt reported) 102     Current Carb: Insulin Ratio   7.9  8  5       Subject CARB:INSULIN RATIOS:      Breakfast Lunch Dinner   From [employer] of Study (in U): 8 8 8    From last maintenance(in U): 5 5 8    New(in U): 5 5 8      Return to Pre-Study Insulin Delivery  Was the subject instructed to return to pre-study insulin delivery and glucose monitoring?    [x]  Yes []  No* explain:     Physical Exam:    Body System Findings If Abnormal, comment   General Appearance [x]  Normal    []  Abnormal  []  Not Assessed  []  Clinically significant  []  Not clinically significant   Skin [x]  Normal    []  Abnormal  []  Not Assessed  []  Clinically significant  []  Not clinically significant   Lymphatics [x]  Normal    []  Abnormal  []  Not Assessed  []  Clinically significant  []  Not clinically significant   HEENT (head, ears, eyes, nose, throat) [x]  Normal    []  Abnormal  []  Not Assessed  []  Clinically significant  []  Not clinically significant   Cardiovascular [x]  Normal    []  Abnormal  []  Not Assessed  []  Clinically significant  []  Not clinically significant   Respiratory [x]  Normal    []  Abnormal  []  Not Assessed  []  Clinically significant  []  Not clinically significant   Abdominal [x]  Normal    []  Abnormal  []  Not Assessed  []  Clinically significant  []  Not clinically significant   Musculoskeletal   [x]  Normal    []  Abnormal  []  Not Assessed  []  Clinically significant  []  Not clinically significant   Neurological [x]  Normal    []  Abnormal  []  Not Assessed  []  Clinically significant  []  Not clinically significant   Other, specify:  ________________   []  Normal    []  Abnormal  []  Clinically significant  []  Not clinically significant         This progress note may be timestamped after permission was given to the coordinator to proceed. Eligibility was determined by me, the investigator, prior to moving forward.

## 2024-01-25 NOTE — Unmapped (Addendum)
 [facility] Appointment Note  Study:  OPTI-2  Description: A Phase 2b Randomized, Double-blind Trial Comparing HDVInsulin Lispro Versus Insulin Lispro Alone in Adults with Type 1 Diabetes Receiving Insulin Degludec   Protocol:  DP 11-2021-01   Site Number: 05  [place] IRB#: [id number]  PI: Noelle Penner, MD, PhD  Visit: Week 25  Source Version:1    Patient: Sandra Underwood  Date: 25Mar2025  DOB: Oct 17, 2001    Checklist:      X Adverse Events   X Concomitant medications   X Vital signs/weight   X Physical Exam   X Central Labs (A1c, Chemistry, Hematology)   X Urine Pregnancy   X Hypoglycemic Symptoms Log Review (see provider note)   X CGM Data Sufficiency Review (see provider note)      X Basal and Bolus dosing review   X Supplies collection   X PRO survey   Updated Informed Consent  Patient signed latest consent form approved 23Sep2024. We discussed, explained, and reviewed the updates to the consent form with the subject in a private, quiet environment. All of the her questions were answered and concerns were addressed. Also, she was given time to review the consent form and to discuss continued participation in the study with family members/others if wanted. Furthermore, she has agreed to continue participation in the study and signed/dated the updated consent form prior to any study procedures. The consent did not require a witness by a third party nor assent. A copy of the signed and dated consent form was given to the her.  Visit Details:    Was the Subject???s data reviewed in the Clarity Software prior to Scheduled Visit Date? No    Adverse Events  Has the subject experienced any Adverse Events since signing the Informed Consent?   [x]   Yes (If 'Yes', please complete an Adverse Events Form)  []   No  Cat scratch R arm, broke out in hives     Has the subject taken any new medications or changed any medication usage since the last visit? Yes(If ???Yes???, please complete a Concomitant Medications Form) Medication Name  (Generic / Trade Name) Dose and Frequency Start Date  (dd/mmm/yyyy) Stop Date  (dd/mmm/yyyy) Class of Medication  Primary Indication   Benadryl 1tab  18Mar2025 18Mar2025 other AE-cat scratch       Vital signs  Subject must rest 5 minutes before reading BP.     Time: 1019  BP: 106/78  Pulse(beats/min): 107  Oral temperature: 36.7C  Respiratory rate(breaths/min): 18  Weight(kg): 86.3kg     Hypoglycemic Symptoms Log     Was the Hypoglycemic Symptoms Log dispensed to the subject?      No. Reason: end of study.   Was the Hypoglycemic Symptoms Log reviewed?   No. Reason: did not use.   Were any Hypoglycemic Symptoms experienced?   No.     CGM DATA SUFFICIENCY REVIEW    The ???Days with CGM Data??? value is located in the Clarity Software on the Subject???s Data page, on the right side of the screen. Please enter the % value noted for ???Days with CGM Data??? for the Date Range selected. Review the Clarity Software for date ranges with a value of >= 80%.       Days with CGM Data  94 %      Physical:  Was physical exam completed? Yes (see provider note)    Labs  Were all labs collected (A1c, hematology, chemistry)? Yes Time: 1026  If WOCBP, was the urine pregnancy test performed? Yes Time: 1055  What was the result? Negative    Patient-Reported Outcomes Survey   Was PRO survey completed and filed in Subject binder? Yes     Insulin Dispensing - Visit    Please ensure to capture dispensing information in the Drug Accountability Log.     Insulin Accountability   Please ensure to capture all return information in the Drug Accountability Logs.  Was all Basal Insulin returned by the subject? [x]  Yes []  No   Was all Bolus Insulin returned by the subject? [x]  Yes []  No       ID/Quantity  Dispensing Date Return Date ML   [id number] 16Sep2024  N/A N/A   [id number] C 16Sep2024 03Oct2024  4.4      [id number] 03Oct2024 N/A  N/A     [id number] 03Oct2024  02Dec2024  10 returned late, unused   [id number] 03Oct2024  28Oct2024  1     V0963 28Oct2024  02Dec2024  0     V6870  28Oct2024  02Dec2024  4      V7041   02Dec2024  N/A 0 Pt states it was empty and accidentally threw it away    V7401  02Dec2024  03Jan2025  7.2     V6399 03Jan2025  24Jan2025  2.2     V2526  03Jan2025  24Jan2025  10 unopened   V6944  24Jan2025  18Feb2025  10  unopened   V4821 24Jan2025  18Feb2025  1     V7633 18Feb2025  18Mar2025  10     V5917 18Feb2025  18Mar2025  0     [id number] 18Mar2025 25Mar2025 8.38      Device and Supply Return Details   Were the following study supplies collected? *Note: subjects may keep the BGM and remaining strips/lancets/needles/syringes.  Unused Dexcom G7 sensors []  Yes [x]  No   []  N/A  Hypoglycemic logs   []  Yes [x]  No   []  N/A    Return to Pre-Study Insulin Delivery  Was the subject instructed to return to pre-study insulin delivery and glucose monitoring?    *see provider note*    Other    Scheduling     Transition Period/Follow up Week 27(+/-4 days)Phone: 11Apr2025     Martin Majestic, CMA

## 2024-01-26 ENCOUNTER — Ambulatory Visit: Admit: 2024-01-26 | Discharge: 2024-01-27

## 2024-01-27 ENCOUNTER — Ambulatory Visit: Admit: 2024-01-27 | Discharge: 2024-01-28 | Payer: BLUE CROSS/BLUE SHIELD

## 2024-01-27 DIAGNOSIS — L508 Other urticaria: Principal | ICD-10-CM

## 2024-01-27 DIAGNOSIS — E1065 Type 1 diabetes mellitus with hyperglycemia: Principal | ICD-10-CM

## 2024-01-27 DIAGNOSIS — E039 Hypothyroidism, unspecified: Principal | ICD-10-CM

## 2024-01-27 DIAGNOSIS — Z7689 Persons encountering health services in other specified circumstances: Principal | ICD-10-CM

## 2024-01-27 MED ORDER — DULOXETINE 30 MG CAPSULE,DELAYED RELEASE
ORAL_CAPSULE | Freq: Every day | ORAL | 0 refills | 30 days | Status: CP
Start: 2024-01-27 — End: 2024-02-26

## 2024-01-27 MED ORDER — TACROLIMUS 1 MG CAPSULE, IMMEDIATE-RELEASE
ORAL_CAPSULE | Freq: Two times a day (BID) | ORAL | 2 refills | 30 days | Status: CP
Start: 2024-01-27 — End: ?
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
## 2024-02-12 NOTE — Unmapped (Signed)
 [place] Appointment Note  Study:  OPTI-2  Description: A Phase 2b Randomized, Double-blind Trial Comparing HDVInsulin Lispro Versus Insulin  Lispro Alone in Adults with Type 1 Diabetes Receiving Insulin  Degludec   Protocol:  DP 11-2021-01   Site Number: 05  [place] IRB#: [id number]  PI: Diantha Fossa, MD, PhD  Visit: Transition period Week 27 Phone Call  Source Version:1      Patient: Sandra Underwood  Date: 11Apr2025  DOB: 02-15-2001    Checklist:    X AE Review   X Concomitant Medications Review   X Successful transition (If no, see provider note)       Adverse Events  Has the subject experienced any Adverse Events since signing the Informed Consent?   []   Yes (If 'Yes', please complete an Adverse Events Form)  [x]   No    Has the subject taken any new medications or changed any medication usage since the last visit? Yes.(If ???Yes???, please complete a Concomitant Medications Form)   Medication Name  (Generic / Trade Name) Dose and Frequency Start Date  (dd/mmm/yyyy) Stop Date  (dd/mmm/yyyy) Class of Medication  Primary Indication   Duloxetine   30mg  26Mar2025 Stopped 60mg  26Mar2025 other Depression/anxiety     Any transition questions or issues?  []   Yes (If 'Yes', please notify investigator)  [x]   No    Other      Mordecai Applebaum, CMA

## 2024-02-19 DIAGNOSIS — L508 Other urticaria: Principal | ICD-10-CM

## 2024-02-19 MED ORDER — DULOXETINE 30 MG CAPSULE,DELAYED RELEASE
ORAL_CAPSULE | Freq: Every day | ORAL | 1 refills | 0.00 days
Start: 2024-02-19 — End: ?

## 2024-02-19 MED ORDER — TACROLIMUS 1 MG CAPSULE, IMMEDIATE-RELEASE
ORAL_CAPSULE | Freq: Two times a day (BID) | ORAL | 1 refills | 0.00 days
Start: 2024-02-19 — End: ?

## 2024-02-19 NOTE — Unmapped (Signed)
 Pharmacy comment: REQUEST FOR 90 DAYS PRESCRIPTION. DX Code Needed.       Patient is requesting the following refill  Requested Prescriptions     Pending Prescriptions Disp Refills    DULoxetine  (CYMBALTA ) 30 MG capsule [Pharmacy Med Name: DULOXETINE  HCL DR 30 MG CAP] 90 capsule 1     Sig: TAKE 1 CAPSULE BY MOUTH EVERY DAY    tacrolimus  (PROGRAF ) 1 MG capsule [Pharmacy Med Name: TACROLIMUS  1 MG CAPSULE (IR)] 180 capsule 1     Sig: TAKE 1 CAPSULE (1 MG TOTAL) BY MOUTH TWO (2) TIMES A DAY.       Recent Visits  Date Type Provider Dept   01/27/24 Office Visit Marcine Severin, FNP [facility]   Showing recent visits within past 365 days and meeting all other requirements  Future Appointments  Date Type Provider Dept   04/21/24 Appointment Marcine Severin, FNP [facility]   Showing future appointments within next 365 days and meeting all other requirements       Labs: Not applicable this refill

## 2024-02-22 MED ORDER — DULOXETINE 30 MG CAPSULE,DELAYED RELEASE
ORAL_CAPSULE | Freq: Every day | ORAL | 1 refills | 90.00 days | Status: CP
Start: 2024-02-22 — End: ?

## 2024-02-22 MED ORDER — TACROLIMUS 1 MG CAPSULE, IMMEDIATE-RELEASE
ORAL_CAPSULE | Freq: Two times a day (BID) | ORAL | 1 refills | 90.00 days | Status: CP
Start: 2024-02-22 — End: ?

## 2024-02-23 NOTE — Unmapped (Signed)
 Sandra Underwood has been contacted in regards to their refill of tacrolimus  1 MG capsule (PROGRAF ). At this time, they have declined refill due to  Picked up at [facility]  . Refill assessment call date has been updated per the patient's request.

## 2024-02-24 MED FILL — CETIRIZINE 10 MG TABLET: ORAL | 30 days supply | Qty: 120 | Fill #4

## 2024-02-24 MED FILL — LEVOTHYROXINE 25 MCG TABLET: ORAL | 90 days supply | Qty: 90 | Fill #1

## 2024-04-14 NOTE — Unmapped (Signed)
 Specialty Medication(s): Tacrolimus     Ms.Bradner has been dis-enrolled from the [facility] specialty pharmacy services as a result of a pharmacy change. The patient is now filling at [facility] in [place], [place].    Additional information provided to the patient: Left VM regarding disenrollment.    Joseph Nickel, PharmD  [facility] Specialty Pharmacist

## 2024-04-19 MED FILL — NORETHINDRONE ACETATE 1 MG-ETHINYL ESTRADIOL 20 MCG TABLET: ORAL | 84 days supply | Qty: 84 | Fill #1

## 2024-04-19 MED FILL — PEN NEEDLE, DIABETIC 32 GAUGE X 5/32" (4 MM): ORAL | 25 days supply | Qty: 100 | Fill #1

## 2024-04-19 MED FILL — CETIRIZINE 10 MG TABLET: ORAL | 30 days supply | Qty: 120 | Fill #5

## 2024-04-21 ENCOUNTER — Ambulatory Visit: Admit: 2024-04-21 | Discharge: 2024-04-22 | Payer: BLUE CROSS/BLUE SHIELD

## 2024-04-21 DIAGNOSIS — E1065 Type 1 diabetes mellitus with hyperglycemia: Principal | ICD-10-CM

## 2024-04-21 DIAGNOSIS — E038 Other specified hypothyroidism: Principal | ICD-10-CM

## 2024-04-21 DIAGNOSIS — F32A Anxiety and depression: Principal | ICD-10-CM

## 2024-04-21 DIAGNOSIS — T7840XD Allergy, unspecified, subsequent encounter: Principal | ICD-10-CM

## 2024-04-21 DIAGNOSIS — R197 Diarrhea, unspecified: Principal | ICD-10-CM

## 2024-04-21 DIAGNOSIS — Z0000 Encounter for general adult medical examination without abnormal findings: Principal | ICD-10-CM

## 2024-04-21 DIAGNOSIS — Z23 Encounter for immunization: Principal | ICD-10-CM

## 2024-04-21 DIAGNOSIS — L508 Other urticaria: Principal | ICD-10-CM

## 2024-04-21 DIAGNOSIS — E039 Hypothyroidism, unspecified: Principal | ICD-10-CM

## 2024-04-21 DIAGNOSIS — F419 Anxiety disorder, unspecified: Principal | ICD-10-CM

## 2024-04-21 DIAGNOSIS — R6882 Decreased libido: Principal | ICD-10-CM

## 2024-04-21 MED ORDER — TACROLIMUS 1 MG CAPSULE, IMMEDIATE-RELEASE
ORAL_CAPSULE | Freq: Two times a day (BID) | ORAL | 1 refills | 90.00000 days | Status: CP
Start: 2024-04-21 — End: ?

## 2024-04-27 MED FILL — LEVOTHYROXINE 25 MCG TABLET: ORAL | 90 days supply | Qty: 90 | Fill #2

## 2024-05-02 ENCOUNTER — Ambulatory Visit: Admit: 2024-05-02 | Discharge: 2024-05-03 | Payer: BLUE CROSS/BLUE SHIELD

## 2024-05-02 DIAGNOSIS — E1065 Type 1 diabetes mellitus with hyperglycemia: Principal | ICD-10-CM

## 2024-05-02 MED ORDER — INSULIN DEGLUDEC (U-100) 100 UNIT/ML (3 ML) SUBCUTANEOUS PEN
SUBCUTANEOUS | 3 refills | 0.00000 days | Status: CP
Start: 2024-05-02 — End: ?
  Filled 2024-05-10: qty 15, 38d supply, fill #0

## 2024-05-03 MED ORDER — OMNIPOD 5 G6-G7 INTRO KIT(GEN 5) SUBCUTANEOUS CARTRIDGE AND CONTROLLER
SUBCUTANEOUS | 1 refills | 0.00000 days | Status: CP
Start: 2024-05-03 — End: ?

## 2024-05-26 DIAGNOSIS — E1065 Type 1 diabetes mellitus with hyperglycemia: Principal | ICD-10-CM

## 2024-05-26 MED ORDER — INSULIN LISPRO (U-100) 100 UNIT/ML SUBCUTANEOUS SOLUTION
SUBCUTANEOUS | 11 refills | 0.00000 days | Status: CP
Start: 2024-05-26 — End: ?
  Filled 2024-05-31: qty 30, 33d supply, fill #0

## 2024-05-27 MED ORDER — CETIRIZINE 10 MG TABLET
ORAL_TABLET | Freq: Two times a day (BID) | ORAL | 5 refills | 30.00000 days
Start: 2024-05-27 — End: ?

## 2024-05-31 MED FILL — PEN NEEDLE, DIABETIC 32 GAUGE X 5/32" (4 MM): ORAL | 25 days supply | Qty: 100 | Fill #2

## 2024-06-01 ENCOUNTER — Ambulatory Visit: Admit: 2024-06-01 | Discharge: 2024-06-02 | Payer: BLUE CROSS/BLUE SHIELD

## 2024-06-14 ENCOUNTER — Ambulatory Visit: Admit: 2024-06-14 | Discharge: 2024-06-15 | Payer: BLUE CROSS/BLUE SHIELD

## 2024-06-14 DIAGNOSIS — E1065 Type 1 diabetes mellitus with hyperglycemia: Principal | ICD-10-CM

## 2024-07-11 DIAGNOSIS — N926 Irregular menstruation, unspecified: Principal | ICD-10-CM

## 2024-07-11 MED ORDER — NORETHINDRONE ACETATE 1 MG-ETHINYL ESTRADIOL 20 MCG TABLET
ORAL_TABLET | Freq: Every day | ORAL | 1 refills | 84.00000 days | Status: CP
Start: 2024-07-11 — End: ?
  Filled 2024-07-13: qty 84, 84d supply, fill #0

## 2024-07-13 MED FILL — LEVOTHYROXINE 25 MCG TABLET: ORAL | 90 days supply | Qty: 90 | Fill #3

## 2024-08-01 ENCOUNTER — Ambulatory Visit: Admit: 2024-08-01 | Discharge: 2024-08-02 | Payer: BLUE CROSS/BLUE SHIELD

## 2024-08-01 DIAGNOSIS — E1065 Type 1 diabetes mellitus with hyperglycemia: Principal | ICD-10-CM

## 2024-08-01 DIAGNOSIS — E039 Hypothyroidism, unspecified: Principal | ICD-10-CM

## 2024-08-17 DIAGNOSIS — Z3169 Encounter for other general counseling and advice on procreation: Principal | ICD-10-CM

## 2024-08-18 DIAGNOSIS — L508 Other urticaria: Principal | ICD-10-CM

## 2024-08-18 MED ORDER — DULOXETINE 30 MG CAPSULE,DELAYED RELEASE
ORAL_CAPSULE | Freq: Every day | ORAL | 5 refills | 30.00000 days | Status: CP
Start: 2024-08-18 — End: ?

## 2024-08-18 MED ORDER — TACROLIMUS 1 MG CAPSULE, IMMEDIATE-RELEASE
ORAL_CAPSULE | Freq: Two times a day (BID) | ORAL | 5 refills | 30.00000 days | Status: CP
Start: 2024-08-18 — End: ?

## 2024-09-05 ENCOUNTER — Ambulatory Visit: Admit: 2024-09-05 | Payer: BLUE CROSS/BLUE SHIELD

## 2024-09-12 MED FILL — HUMALOG U-100 INSULIN 100 UNIT/ML SUBCUTANEOUS SOLUTION: SUBCUTANEOUS | 33 days supply | Qty: 30 | Fill #1

## 2024-09-13 MED ORDER — DULOXETINE 30 MG CAPSULE,DELAYED RELEASE
ORAL_CAPSULE | Freq: Every day | ORAL | 2 refills | 0.00000 days
Start: 2024-09-13 — End: ?

## 2024-09-14 MED ORDER — DULOXETINE 30 MG CAPSULE,DELAYED RELEASE
ORAL_CAPSULE | Freq: Every day | ORAL | 2 refills | 90.00000 days | Status: CP
Start: 2024-09-14 — End: ?

## 2024-10-14 DIAGNOSIS — L508 Other urticaria: Principal | ICD-10-CM

## 2024-10-14 MED ORDER — TACROLIMUS 1 MG CAPSULE, IMMEDIATE-RELEASE
ORAL_CAPSULE | Freq: Two times a day (BID) | ORAL | 2 refills | 0.00000 days
Start: 2024-10-14 — End: ?

## 2024-10-17 DIAGNOSIS — E1065 Type 1 diabetes mellitus with hyperglycemia: Principal | ICD-10-CM

## 2024-10-17 MED ORDER — TACROLIMUS 1 MG CAPSULE, IMMEDIATE-RELEASE
ORAL_CAPSULE | Freq: Two times a day (BID) | ORAL | 2 refills | 90.00000 days | Status: CP
Start: 2024-10-17 — End: ?

## 2024-10-17 MED ORDER — PEN NEEDLE, DIABETIC 32 GAUGE X 5/32" (4 MM)
12 refills | 0.00000 days
Start: 2024-10-17 — End: ?

## 2024-10-18 MED ORDER — PEN NEEDLE, DIABETIC 32 GAUGE X 5/32" (4 MM)
ORAL | 12 refills | 0.00000 days | Status: CP
Start: 2024-10-18 — End: ?
  Filled 2024-10-19: qty 100, 25d supply, fill #0

## 2024-10-19 DIAGNOSIS — E1065 Type 1 diabetes mellitus with hyperglycemia: Principal | ICD-10-CM

## 2024-10-19 MED ORDER — INSULIN LISPRO (U-100) 100 UNIT/ML SUBCUTANEOUS SOLUTION
11 refills | 0.00000 days | Status: CP
Start: 2024-10-19 — End: ?
  Filled 2024-10-19: qty 30, 33d supply, fill #2

## 2024-10-19 MED FILL — TRESIBA FLEXTOUCH U-100 INSULIN 100 UNIT/ML (3 ML) SUBCUTANEOUS PEN: SUBCUTANEOUS | 38 days supply | Qty: 15 | Fill #1

## 2024-11-06 MED ORDER — LEVOTHYROXINE 25 MCG TABLET
ORAL_TABLET | Freq: Every day | ORAL | 3 refills | 90.00000 days
Start: 2024-11-06 — End: 2025-11-06

## 2024-11-07 MED ORDER — LEVOTHYROXINE 25 MCG TABLET
ORAL_TABLET | Freq: Every day | ORAL | 3 refills | 90.00000 days | Status: CP
Start: 2024-11-07 — End: 2025-11-07
  Filled 2024-11-10: qty 90, 90d supply, fill #0

## 2024-11-14 DIAGNOSIS — E1065 Type 1 diabetes mellitus with hyperglycemia: Principal | ICD-10-CM

## 2024-11-14 MED ORDER — INSULIN LISPRO (U-100) 100 UNIT/ML SUBCUTANEOUS SOLUTION
11 refills | 0.00000 days | Status: CP
Start: 2024-11-14 — End: ?

## 2024-11-15 DIAGNOSIS — E1065 Type 1 diabetes mellitus with hyperglycemia: Principal | ICD-10-CM

## 2024-11-15 MED ORDER — INSULIN LISPRO (U-100) 100 UNIT/ML SUBCUTANEOUS SOLUTION
11 refills | 0.00000 days
Start: 2024-11-15 — End: ?

## 2024-11-16 MED ORDER — INSULIN ASPART (U-100) 100 UNIT/ML (3 ML) SUBCUTANEOUS PEN
12 refills | 0.00000 days | Status: CP
Start: 2024-11-16 — End: ?

## 2024-11-16 MED ORDER — INSULIN LISPRO (U-100) 100 UNIT/ML SUBCUTANEOUS SOLUTION
11 refills | 0.00000 days
Start: 2024-11-16 — End: ?

## 2024-11-22 MED ORDER — DULOXETINE 30 MG CAPSULE,DELAYED RELEASE
ORAL_CAPSULE | Freq: Every day | ORAL | 0 refills | 90.00000 days | Status: CP
Start: 2024-11-22 — End: ?

## 2024-11-24 DIAGNOSIS — E1065 Type 1 diabetes mellitus with hyperglycemia: Principal | ICD-10-CM

## 2024-12-01 MED ORDER — METHOCARBAMOL 500 MG TABLET
ORAL_TABLET | Freq: Two times a day (BID) | ORAL | 0 refills | 5.00000 days | Status: CP
Start: 2024-12-01 — End: 2024-12-06

## 2024-12-02 ENCOUNTER — Emergency Department
Admit: 2024-12-02 | Discharge: 2024-12-02 | Disposition: A | Discharge status: Home / Self Care | Payer: BLUE CROSS/BLUE SHIELD

## 2024-12-05 MED ORDER — BASAGLAR KWIKPEN U-100 INSULIN 100 UNIT/ML (3 ML) SUBCUTANEOUS
12 refills | 0.00000 days | Status: CP
Start: 2024-12-05 — End: ?
